# Patient Record
Sex: Female | Born: 1940 | Race: White | Hispanic: No | Smoking: Former smoker
Health system: Southern US, Community
[De-identification: ages and names within clinical notes are randomized; demographics above are authoritative.]

## PROBLEM LIST (undated history)

## (undated) DIAGNOSIS — Z9889 Other specified postprocedural states: Secondary | ICD-10-CM

## (undated) DIAGNOSIS — I509 Heart failure, unspecified: Secondary | ICD-10-CM

## (undated) DIAGNOSIS — N186 End stage renal disease: Secondary | ICD-10-CM

## (undated) DIAGNOSIS — I2510 Atherosclerotic heart disease of native coronary artery without angina pectoris: Secondary | ICD-10-CM

## (undated) DIAGNOSIS — C801 Malignant (primary) neoplasm, unspecified: Secondary | ICD-10-CM

## (undated) DIAGNOSIS — J189 Pneumonia, unspecified organism: Secondary | ICD-10-CM

## (undated) DIAGNOSIS — Z992 Dependence on renal dialysis: Secondary | ICD-10-CM

## (undated) DIAGNOSIS — I10 Essential (primary) hypertension: Secondary | ICD-10-CM

## (undated) DIAGNOSIS — F329 Major depressive disorder, single episode, unspecified: Secondary | ICD-10-CM

## (undated) DIAGNOSIS — D649 Anemia, unspecified: Secondary | ICD-10-CM

## (undated) DIAGNOSIS — E785 Hyperlipidemia, unspecified: Secondary | ICD-10-CM

## (undated) DIAGNOSIS — R112 Nausea with vomiting, unspecified: Secondary | ICD-10-CM

## (undated) DIAGNOSIS — J449 Chronic obstructive pulmonary disease, unspecified: Secondary | ICD-10-CM

## (undated) DIAGNOSIS — E669 Obesity, unspecified: Secondary | ICD-10-CM

## (undated) DIAGNOSIS — R55 Syncope and collapse: Secondary | ICD-10-CM

## (undated) DIAGNOSIS — F32A Depression, unspecified: Secondary | ICD-10-CM

## (undated) DIAGNOSIS — I4891 Unspecified atrial fibrillation: Secondary | ICD-10-CM

## (undated) DIAGNOSIS — I422 Other hypertrophic cardiomyopathy: Secondary | ICD-10-CM

## (undated) HISTORY — PX: BONE MARROW BIOPSY: SHX199

## (undated) HISTORY — PX: ABDOMINAL HYSTERECTOMY: SHX81

## (undated) HISTORY — DX: Atherosclerotic heart disease of native coronary artery without angina pectoris: I25.10

## (undated) HISTORY — DX: Essential (primary) hypertension: I10

## (undated) HISTORY — PX: APPENDECTOMY: SHX54

## (undated) HISTORY — DX: Syncope and collapse: R55

## (undated) HISTORY — PX: COLONOSCOPY: SHX174

## (undated) HISTORY — DX: Other hypertrophic cardiomyopathy: I42.2

## (undated) HISTORY — DX: Hyperlipidemia, unspecified: E78.5

## (undated) HISTORY — DX: Chronic obstructive pulmonary disease, unspecified: J44.9

## (undated) HISTORY — DX: Obesity, unspecified: E66.9

---

## 2006-11-03 ENCOUNTER — Encounter: Payer: Self-pay | Admitting: Internal Medicine

## 2006-11-03 ENCOUNTER — Ambulatory Visit: Payer: Self-pay

## 2006-12-13 ENCOUNTER — Ambulatory Visit: Payer: Self-pay | Admitting: Internal Medicine

## 2006-12-18 ENCOUNTER — Ambulatory Visit: Payer: Self-pay

## 2007-02-28 ENCOUNTER — Ambulatory Visit: Payer: Self-pay | Admitting: Internal Medicine

## 2007-03-22 ENCOUNTER — Ambulatory Visit: Payer: Self-pay

## 2007-03-22 ENCOUNTER — Ambulatory Visit: Payer: Self-pay | Admitting: Internal Medicine

## 2007-03-22 ENCOUNTER — Ambulatory Visit (HOSPITAL_COMMUNITY): Admission: RE | Admit: 2007-03-22 | Discharge: 2007-03-22 | Payer: Self-pay | Admitting: Internal Medicine

## 2007-10-10 ENCOUNTER — Ambulatory Visit: Payer: Self-pay | Admitting: Internal Medicine

## 2007-10-26 ENCOUNTER — Ambulatory Visit: Payer: Self-pay

## 2007-10-26 ENCOUNTER — Encounter: Payer: Self-pay | Admitting: Internal Medicine

## 2008-03-13 ENCOUNTER — Ambulatory Visit: Payer: Self-pay | Admitting: Internal Medicine

## 2008-04-04 ENCOUNTER — Ambulatory Visit: Payer: Self-pay | Admitting: Internal Medicine

## 2008-05-09 HISTORY — PX: CORONARY ANGIOPLASTY: SHX604

## 2008-10-27 ENCOUNTER — Ambulatory Visit: Payer: Self-pay | Admitting: Internal Medicine

## 2008-10-27 DIAGNOSIS — I10 Essential (primary) hypertension: Secondary | ICD-10-CM | POA: Insufficient documentation

## 2008-11-04 ENCOUNTER — Telehealth: Payer: Self-pay | Admitting: Internal Medicine

## 2008-12-12 ENCOUNTER — Encounter: Payer: Self-pay | Admitting: Internal Medicine

## 2008-12-12 ENCOUNTER — Ambulatory Visit: Payer: Self-pay

## 2008-12-19 ENCOUNTER — Telehealth: Payer: Self-pay | Admitting: Internal Medicine

## 2008-12-23 ENCOUNTER — Telehealth: Payer: Self-pay | Admitting: Internal Medicine

## 2009-03-06 ENCOUNTER — Encounter: Payer: Self-pay | Admitting: Internal Medicine

## 2009-03-09 ENCOUNTER — Ambulatory Visit: Payer: Self-pay | Admitting: Internal Medicine

## 2009-03-09 ENCOUNTER — Encounter: Payer: Self-pay | Admitting: Internal Medicine

## 2009-03-09 DIAGNOSIS — R0602 Shortness of breath: Secondary | ICD-10-CM | POA: Insufficient documentation

## 2009-03-11 LAB — CONVERTED CEMR LAB
BUN: 15 mg/dL (ref 6–23)
Basophils Absolute: 0 10*3/uL (ref 0.0–0.1)
Calcium: 8.9 mg/dL (ref 8.4–10.5)
Eosinophils Relative: 2.4 % (ref 0.0–5.0)
GFR calc non Af Amer: 65.66 mL/min (ref >60)
HCT: 38.6 % (ref 36.0–46.0)
Hemoglobin: 13.6 g/dL (ref 12.0–15.0)
INR: 1 (ref 0.8–1.0)
Lymphocytes Relative: 40.1 % (ref 12.0–46.0)
Lymphs Abs: 1.4 10*3/uL (ref 0.7–4.0)
Monocytes Relative: 11.9 % (ref 3.0–12.0)
Platelets: 218 10*3/uL (ref 150.0–400.0)
Potassium: 4.1 meq/L (ref 3.5–5.1)
Prothrombin Time: 10.2 s (ref 9.1–11.7)
Sodium: 141 meq/L (ref 135–145)
WBC: 3.6 10*3/uL — ABNORMAL LOW (ref 4.5–10.5)
aPTT: 26.5 s (ref 21.7–28.8)

## 2009-03-16 ENCOUNTER — Inpatient Hospital Stay (HOSPITAL_BASED_OUTPATIENT_CLINIC_OR_DEPARTMENT_OTHER): Admission: RE | Admit: 2009-03-16 | Discharge: 2009-03-16 | Payer: Self-pay | Admitting: Internal Medicine

## 2009-03-16 ENCOUNTER — Inpatient Hospital Stay (HOSPITAL_COMMUNITY): Admission: AD | Admit: 2009-03-16 | Discharge: 2009-03-17 | Payer: Self-pay | Admitting: Cardiovascular Disease

## 2009-03-16 ENCOUNTER — Ambulatory Visit: Payer: Self-pay | Admitting: Internal Medicine

## 2009-03-18 ENCOUNTER — Encounter: Payer: Self-pay | Admitting: Internal Medicine

## 2009-04-14 ENCOUNTER — Encounter: Payer: Self-pay | Admitting: Internal Medicine

## 2009-04-14 ENCOUNTER — Telehealth: Payer: Self-pay | Admitting: Internal Medicine

## 2009-04-29 ENCOUNTER — Ambulatory Visit: Payer: Self-pay | Admitting: Internal Medicine

## 2009-04-29 DIAGNOSIS — I2510 Atherosclerotic heart disease of native coronary artery without angina pectoris: Secondary | ICD-10-CM

## 2009-05-25 ENCOUNTER — Telehealth: Payer: Self-pay | Admitting: Internal Medicine

## 2009-06-05 ENCOUNTER — Encounter: Payer: Self-pay | Admitting: Internal Medicine

## 2009-08-11 ENCOUNTER — Ambulatory Visit: Payer: Self-pay | Admitting: Internal Medicine

## 2009-08-31 ENCOUNTER — Ambulatory Visit: Payer: Self-pay | Admitting: Internal Medicine

## 2009-10-19 ENCOUNTER — Encounter: Payer: Self-pay | Admitting: Internal Medicine

## 2009-11-20 ENCOUNTER — Ambulatory Visit: Payer: Self-pay | Admitting: Internal Medicine

## 2010-02-15 ENCOUNTER — Ambulatory Visit: Payer: Self-pay | Admitting: Internal Medicine

## 2010-02-16 ENCOUNTER — Telehealth: Payer: Self-pay | Admitting: Internal Medicine

## 2010-02-23 ENCOUNTER — Encounter: Payer: Self-pay | Admitting: Internal Medicine

## 2010-03-12 ENCOUNTER — Telehealth (INDEPENDENT_AMBULATORY_CARE_PROVIDER_SITE_OTHER): Payer: Self-pay | Admitting: *Deleted

## 2010-05-25 ENCOUNTER — Telehealth: Payer: Self-pay | Admitting: Internal Medicine

## 2010-06-09 NOTE — Progress Notes (Signed)
Summary: Problem with handicapped placard  ---- Converted from flag ---- ---- 02/23/2010 8:41 AM, Glynda Jaeger wrote: [name] [name],   This patient called wanting to talk with you, she would like a call from you, says it is important. She is a patient of [facility]. Can be reached at [phone].  Thanks, Melissa ------------------------------ I spoke with patient on 10/18 pm.  Patient said that she took an application for a handicapped placard that we completed to the [facility] in [place], but information was missing.  The [facility] faxed the form here while [name] was waiting.  [name]'s comcern is that we were not able to complete the form while she waited and she had to drive back to the [facility].  I told her that I was sorry that happened, but that non-urgent paperwork usually took more than a few hours to complete.  Again, I apologized.  I followed-up with Dr. Prescott Gum nurse and she assured me that the paperwork was completed the day after it was faxed.

## 2010-06-09 NOTE — Assessment & Plan Note (Signed)
Summary: consult appt. loop recorder/sl  Medications Added METOPROLOL SUCCINATE 50 MG XR24H-TAB (METOPROLOL SUCCINATE) 1 by mouth daily METOPROLOL SUCCINATE 50 MG XR24H-TAB (METOPROLOL SUCCINATE) Take one-half  tablet by mouth daily      Allergies Added:   Visit Type:  Initial Consult Primary Provider:  Violet Baldy MD  CC:  syncope.  History of Present Illness: [name] [name] is a  70 year-old woman he at the request of Dr. Dorthea Cove because of episodes of exercise associated lightheadedness. These episodes are always associated with exertion of various degrees but not necessarily reproducible. They're unassociated with diaphoresis nausea. They're occasionally associated with pallor. There is some residual fatigue. If she continues to push herself thereafter she can trigger another episode. she is aware that the episodes are going to her and describes a prodrome as a squeezing in her head". She dates the onset of these back to the death of her husband 3 or 4 years ago although her friend who accompanied her said that this may be partly related to the fact that with him she is much more active  She originally came to medical attention because of the identification of hypertrophic obstructive cardiomyopathy in  her son. He did need to undergo myectomy. she herself underwent a  Echocardiogram demonstrating EF of 70% with septum being 1.7 cm.  There was mild chordal SAM, but no significant outflow tract gradient.  She also underwent stress echo which failed to demonstrate enhancement of the gradient with exercise.  She then underwen  a CPX test which showed normal blood pressure response to exercise with relatively preserved VO2. she also underwent catheterization in November 2010 underwent cath which showed high-grade LAD disease. RHC was normal. She underwent Endeavour DES to LAD and balloon of the diagonal in 11/10. unfortunately this was not associated with any improvement in her symptoms.  E.  cardiogram from her last visit with Dr. Bryson Ha was reviewed and demonstrated normal sinus rhythm with normal intervals     Current Medications (verified): 1)  Losartan Potassium-Hctz 100-12.5 Mg Tabs (Losartan Potassium-Hctz) .... Once Daily 2)  Alendronate Sodium 70 Mg Tabs (Alendronate Sodium) .... Weekly 3)  Simvastatin 40 Mg Tabs (Simvastatin) .... Take One Tablet By Mouth Daily At Bedtime 4)  Metoprolol Succinate 50 Mg Xr24h-Tab (Metoprolol Succinate) .Marland Kitchen.. 1 By Mouth Daily 5)  Aspirin 81 Mg Tbec (Aspirin) .... Take One Tablet By Mouth Daily 6)  Plavix 75 Mg Tabs (Clopidogrel Bisulfate) .... Take One Tablet By Mouth Daily  Allergies (verified): 1)  ! Pcn 2)  ! Morphine  Vital Signs:  Patient profile:   70 year old female Height:      63 inches Weight:      173 pounds BMI:     30.76 Pulse (ortho):   67 / minute Resp:     18 per minute BP standing:   142 / 79  Vitals Entered By: Marrion Coy, CNA (August 31, 2009 1:52 PM)  Serial Vital Signs/Assessments:  Time      Position  BP       Pulse  Resp  Temp     By           Lying RA  134/69   59                    Christy Tuck, CNA           Sitting   145/77   62  Marrion Coy, CNA           Standing  142/79   [address] CNA 2 MIn     Standing  138/74   [address] CNA 5 Min     Standing  132/75   68                    [employer], CNA  Comments: Lying- No complaints Sitting- No complaints Standing- No complaints  By: Marrion Coy, CNA  2 MIn 2 Min Standing- No complaints By: Marrion Coy, CNA  5 Min 5 Min standing- No complaints By: Marrion Coy, CNA    Physical Exam  General:  Well developed, well nourished, in no acute distress. Head:  normal HEENT Neck:  supple without thyromegaly Chest Wall:  mild kyphosis; no CVA tenderness Lungs:  clear to auscultation Heart:  regular rate and rhythm with a 2/6 murmur heard best at the right upper  sternal border enhanced by Valsalva Abdomen:  soft and nontender without hepatomegaly. No midline pulsation was appreciated Msk:  Back normal, normal gait. Muscle strength and tone normal. Pulses:  intact distal pulses Extremities:  no clubbing cyanosis or edema Neurologic:  Alert and oriented x 3. Skin:  the skin was warm and dry Cervical Nodes:  she is some submandibularlymph nodes but none others Psych:  engaging affect   Impression & Recommendations:  Problem # 1:  PRE-SYNCOPE (ICD-780.4) [name] [name] has recurrent episodes of presyncope associated with exertion. It is possible that these are arrhythmic; it is also possible that they represent vascular response to exertion. I agree that monitoring will be critical to try to identify whether there is any rhythmic trigger. Given the frequency of these episodes and the reproducible nature of them I think an external monitor may be a way to do it at first. We have discussed the role of a single 19 hours monitor and we will begin with that. In the event that this fails we'll pursue an implantable loop recorder  Problem # 2:  HCM (ICD-425.1) she is hypertrophic nonobstructive cardiomyopathy.Her murmur enhances little bit with Valsalva; however, her echo was unimpressive for stress-induced obstruction.  Problem # 3:  DYSPNEA (ICD-786.05) Please see above discussion Her updated medication list for this problem includes:    Losartan Potassium-hctz 100-12.5 Mg Tabs (Losartan potassium-hctz) ..... Once daily    Metoprolol Succinate 50 Mg Xr24h-tab (Metoprolol succinate) .Marland Kitchen... Take one-half  tablet by mouth daily    Aspirin 81 Mg Tbec (Aspirin) .Marland Kitchen... Take one tablet by mouth daily  Other Orders: Event (Event)  Patient Instructions: 1)  Your physician has recommended that you wear an event monitor.  Event monitors are medical devices that record the heart's electrical activity. Doctors most often use these monitors to diagnose arrhythmias.  Arrhythmias are problems with the speed or rhythm of the heartbeat. The monitor is a small, portable device. You can wear one while you do your normal daily activities. This is usually used to diagnose what is causing palpitations/syncope (passing out). 2)  Your physician recommends that you schedule a follow-up appointment in: 4-6 weeks with Dr Graciela Husbands.

## 2010-06-09 NOTE — Progress Notes (Signed)
Summary: pt needs handicap sticker  Phone Note Call from Patient Call back at [phone]   Caller: Patient Reason for Call: Talk to Nurse, Talk to Doctor Summary of Call: pt forgot to ask for a handicap sticker application for when she goes out becasue she has fainting spells when she walks far Initial call taken by: Omer Jack,  February 16, 2010 9:02 AM  Follow-up for Phone Call        Dr Gala Romney signed form, mailed it to pt, she is aware Meredith Staggers, RN  February 16, 2010 12:40 PM

## 2010-06-09 NOTE — Progress Notes (Signed)
Summary: plavix/foot cut   Phone Note Call from Patient Call back at [phone]   Summary of Call: pt on plavix, cut her foot, has bandage on.... afraid if she takes it off it will start bleeding again.  Should she take her meds this am? Should she do to cardio rehab, request call back asap Initial call taken by: Migdalia Dk,  May 25, 2009 8:19 AM  Follow-up for Phone Call        Pt cut her foot just above her ankle and she is on Plavix. They applied a dressing yesterday and wanted to know whether to take it off today. I advised her to leave it on today and take it off tomorrow. She wanted to know whether to take her Plavix and ASA today. I told her she could hold off today and resume it tomorrow as well. She wanted to know whether to go on to Cardiac Rehab. I told her that would be fine since there are nurses there and would be able to bandage it better and maybe apply a pressure dressing for her. She understands and will call with any other issues.  Follow-up by: Duncan Dull, RN, BSN,  May 25, 2009 8:46 AM     Appended Document: plavix/foot cut she has recent DES to LAD, please call her and make sure she resumes plavix asap. if cut on foot is bad needs to go see PCP

## 2010-06-09 NOTE — Assessment & Plan Note (Signed)
Summary: f56m  Medications Added LOSARTAN POTASSIUM-HCTZ 100-12.5 MG TABS (LOSARTAN POTASSIUM-HCTZ) once daily ASPIRIN 81 MG TBEC (ASPIRIN) Take one tablet by mouth daily      Allergies Added:   Visit Type:  Follow-up Primary Provider:  Violet Baldy MD  CC:  sob -- with exertion.  History of Present Illness: [name] [name] is a very pleasant 70 year-old woman with a history of hypertension, COPD, hyperlipidemia, and mild hypertrophic cardiomyopathy without significant obstruction. Echocardiogram has an EF of 70% with septum being 1.7 cm.  There was mild chordal SAM, but no significant outflow tract gradient.  She had a CPX test which showed normal blood pressure response to exercise with relatively preserved VO2.  There is a question of mild exercise-induced bronchospasm, but she has not responded well to inhalers in the past.   In November 2010 underwent cath which showed high-grade LAD disease. RHC was normal. She underwent Endeavour DES to LAD and balloon of the diagonal in 11/10.   Returns for routine f/u.  Still in cardiac rehab maintenance program. Still having occasional spells where she feels presyncopal.  Occurs approximately 2x/month. No frank syncope. Had previous monitor which was unremarkable. Pain in hips with walking. No CP. Chronic exertion dyspnea.  No bleeding with Plavix.   Current Medications (verified): 1)  Losartan Potassium-Hctz 100-12.5 Mg Tabs (Losartan Potassium-Hctz) .... Once Daily 2)  Alendronate Sodium 70 Mg Tabs (Alendronate Sodium) .... Weekly 3)  Simvastatin 40 Mg Tabs (Simvastatin) .... Take One Tablet By Mouth Daily At Bedtime 4)  Metoprolol Succinate 25 Mg Xr24h-Tab (Metoprolol Succinate) .... Take One Tablet By Mouth Daily 5)  Aspirin 81 Mg Tbec (Aspirin) .... Take One Tablet By Mouth Daily 6)  Plavix 75 Mg Tabs (Clopidogrel Bisulfate) .... Take One Tablet By Mouth Daily  Allergies (verified): 1)  ! Pcn 2)  ! Morphine  Past History:  Past Medical  History: HTN Hyperlipidemia Mild hypertrophic CM without outflow tract obstruction    -- ETT/Myoview EF 74%. no ischemia (12/2006)    --stress echo 8/10: normal BP response to exercise. no presyncope              no wall motion abnormalties. No significant LVOT obstruction.                non-diagnositc ST depression    --holter monitor normal CAD   --s/p DES LAD and POBA diagonal 11/10   --RHC cath normal COPD Obesity Pre-syncope  Review of Systems       As per HPI and past medical history; otherwise all systems negative.   Vital Signs:  Patient profile:   70 year old [name] Height:      63 inches Weight:      172 pounds BMI:     30.[name] Pulse rate:   59 / minute BP sitting:   132 / 68  (left arm) Cuff size:   regular  Vitals Entered By: Hardin Negus, RMA (August 11, 2009 11:24 AM)  Physical Exam  General:  Gen: well appearing. no resp difficulty HEENT: normal Neck: supple. no JVD. Carotids 2+ bilat; no bruits. No lymphadenopathy or thryomegaly appreciated. Cor: PMI nondisplaced. Regular rate & rhythm. No rubs, gallops, no LVOT murmur at rest or with valsalva Lungs: clear Abdomen: soft, nontender, nondistended. No hepatosplenomegaly. No bruits or masses. Good bowel sounds. Extremities: no cyanosis, clubbing, rash, edema. r groin site fine. no hematome or bruit Neuro: alert & orientedx3, cranial nerves grossly intact. moves all 4 extremities w/o difficulty.  affect pleasant    Impression & Recommendations:  Problem # 1:  CAD, NATIVE VESSEL (ICD-414.01) Stable. No evidence of ischemia. Continue current regimen.  Problem # 2:  SYNCOPE (ICD-780.2) Remains unexplained despite exhaustive work-up. We have done a monitor with no evidence of arrythmia and stress echo no provokable LVOT gradient yet episodes continue. Will refer to EP for possible implantable loop recorder to help evaluate.  Other Orders: EKG w/ Interpretation (93000) EP Referral (Cardiology EP Ref  )  Patient Instructions: 1)  You have been referred to EP MDs for next week to discuss an implantable loop recorder 2)  Follow up with Dr Gala Romney in 6 months Prescriptions: METOPROLOL SUCCINATE 25 MG XR24H-TAB (METOPROLOL SUCCINATE) Take one tablet by mouth daily  #90 x 3   Entered by:   Meredith Staggers, RN   Authorized by:   Dolores Patty, MD, [facility]   Signed by:   Meredith Staggers, RN on 08/11/2009   Method used:   Electronically to        [facility]* (retail)       [address]       [place], [place]  [address]       Ph: [phone]       Fax: [phone]   RxID:   [id number]

## 2010-06-09 NOTE — Miscellaneous (Signed)
Summary: [facility] Cardiac Progress Note  [facility] Cardiac Progress Note   Imported By: Roderic Ovens 10/13/2009 12:22:42  _____________________________________________________________________  External Attachment:    Type:   Image     Comment:   External Document

## 2010-06-09 NOTE — Assessment & Plan Note (Signed)
Summary: 6week return.amber  Medications Added LOSARTAN POTASSIUM 100 MG TABS (LOSARTAN POTASSIUM) Take 1 tablet by mouth once a day        Visit Type:  6 wk f/u Primary Provider:  Violet Baldy MD  CC:  sob...denies any cp or edema.  History of Present Illness: [name] [name] is he had followup for episodes of lightheadedness associated with staining and also exertion. As we hear the story today there is a great deal more emphasis in the telemetry noted any change of symptoms with standing. She does not have symptoms lying or sitting.  She does have modest hypertrophic cardiomyopathy And hypertension  She underwent event loop recording and had a number of episodes while wearing the monitor. These were all associated with sinus rhythm.          Allergies: 1)  ! Pcn 2)  ! Morphine  Past History:  Past Medical History: Last updated: 08/11/2009 HTN Hyperlipidemia Mild hypertrophic CM without outflow tract obstruction    -- ETT/Myoview EF 74%. no ischemia (12/2006)    --stress echo 8/10: normal BP response to exercise. no presyncope              no wall motion abnormalties. No significant LVOT obstruction.                non-diagnositc ST depression    --holter monitor normal CAD   --s/p DES LAD and POBA diagonal 11/10   --RHC cath normal COPD Obesity Pre-syncope  Vital Signs:  Patient profile:   70 year old female Height:      63 inches Weight:      173 pounds BMI:     30.76 Pulse rate:   71 / minute Pulse rhythm:   regular BP sitting:   134 / 66  (left arm) Cuff size:   regular  Vitals Entered By: Danielle Rankin, CMA (November 20, 2009 2:18 PM)  Physical Exam  General:  The patient was alert and oriented in no acute distress. HEENT Normal.  Neck veins were flat, carotids were brisk.  Lungs were clear.  Heart sounds were regular without murmurs or gallops.  Abdomen was soft with active bowel sounds. There is no clubbing cyanosis or edema. Skin Warm and  dry    Impression & Recommendations:  Problem # 1:  PRE-SYNCOPE (ICD-780.4) I suspect that this is largely a manifestation of orthostatic hypotension. With her hypertrophic heart disease there may also be some vasomotor instability associated with exertion. I have instructed her in some exercises that she can do to help mitigate changes in blood pressure with standing. We have also discontinued the diuretic portion of her antihypertensive therapy. Negative inotropic medications given her hypertrophic cardiomyopathy might also be helpful. I will defer this to her follow up with Dr. Dorthea Cove  Problem # 2:  HYPERTENSION, BENIGN (ICD-401.1) as above Her updated medication list for this problem includes:    Losartan Potassium 100 Mg Tabs (Losartan potassium) .Marland Kitchen... Take 1 tablet by mouth once a day    Metoprolol Succinate 50 Mg Xr24h-tab (Metoprolol succinate) .Marland Kitchen... Take one-half  tablet by mouth daily    Aspirin 81 Mg Tbec (Aspirin) .Marland Kitchen... Take one tablet by mouth daily  Problem # 3:  HCM (ICD-425.1) as above Her updated medication list for this problem includes:    Losartan Potassium 100 Mg Tabs (Losartan potassium) .Marland Kitchen... Take 1 tablet by mouth once a day    Metoprolol Succinate 50 Mg Xr24h-tab (Metoprolol succinate) .Marland Kitchen... Take one-half  tablet  by mouth daily    Aspirin 81 Mg Tbec (Aspirin) .Marland Kitchen... Take one tablet by mouth daily    Plavix 75 Mg Tabs (Clopidogrel bisulfate) .Marland Kitchen... Take one tablet by mouth daily  Problem # 4:  CAD, NATIVE VESSEL (ICD-414.01) no chest pains stable her current medications Her updated medication list for this problem includes:    Metoprolol Succinate 50 Mg Xr24h-tab (Metoprolol succinate) .Marland Kitchen... Take one-half  tablet by mouth daily    Aspirin 81 Mg Tbec (Aspirin) .Marland Kitchen... Take one tablet by mouth daily    Plavix 75 Mg Tabs (Clopidogrel bisulfate) .Marland Kitchen... Take one tablet by mouth daily  Patient Instructions: 1)  Your physician has recommended you make the following change in  your medication: Change Losartan/HCT to Losartan 100mg  1 tablet daily. 2)  Your physician wants you to follow-up in: 3 months with Dr Gala Romney.  You will receive a reminder letter in the mail two months in advance. If you don't receive a letter, please call our office to schedule the follow-up appointment. Prescriptions: LOSARTAN POTASSIUM 100 MG TABS (LOSARTAN POTASSIUM) Take 1 tablet by mouth once a day  #30 x 11   Entered by:   Optometrist BSN   Authorized by:   Nathen May, MD, [facility]   Signed by:   Gypsy Balsam RN BSN on 11/20/2009   Method used:   Electronically to        [id number]* (retail)       [address]       [place], [place]  [address]       Ph: [phone]       Fax: [phone]   RxID:   [id number]

## 2010-06-09 NOTE — Letter (Signed)
Summary: [facility] Office Visit Note  [facility] Office Visit Note   Imported By: Roderic Ovens 10/29/2009 10:44:39  _____________________________________________________________________  External Attachment:    Type:   Image     Comment:   External Document

## 2010-06-09 NOTE — Assessment & Plan Note (Signed)
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 Echocardiogram has an EF of 70% with septum being 1.7 cm.  There was mild chordal SAM, but no significant outflow tract gradient.  She had a CPX test which showed normal blood pressure response to exercise with relatively preserved VO2.  There is a question of mild exercise-induced bronchospasm, but she has not responded well to inhalers in the past.  Also CAD s/p DES  to LAD 11/10.  She returns today for routine followup. Continues to have spells that when she is walking gets SOB and then feels presyncopal.  Can happen 2x/day or not bother her for a week. Very intermittent. Can go to senior dance without problems but then can have an episode while just walking slowly. Sits down or leans against the wall and it gets better. No frank  loss of consciousness. No CP or palpitations. Does have mild dyspnea when she walks quickly. No edema, PND or orthopnea.   In past she had stress echo with normal BP response and no significant LVOT gradient. Holter normal. I referred her to Dr. Graciela Husbands for consideration of ILR. Given the frequency of her events he decided to repeat Holter. She had several episodes of symptoms while wearing monitor but all corresponded to SR. Felt like symptoms might be related to autonomic dysfunction.   Current Medications (verified): 1)  Losartan Potassium 100 Mg Tabs (Losartan Potassium) .... Take 1 Tablet By Mouth  Once A Day 2)  Simvastatin 5 Mg Tabs (Simvastatin) .... Take One Tablet By Mouth Daily At Bedtime 3)  Metoprolol Succinate 50 Mg Xr24h-Tab (Metoprolol Succinate) .... Take One-Half  Tablet By Mouth Daily 4)  Aspirin 81 Mg Tbec (Aspirin) .... Take One Tablet By Mouth Daily 5)  Plavix 75 Mg Tabs (Clopidogrel Bisulfate) .... Take One Tablet By Mouth Daily 6)  Centrum Silver  Tabs (Multiple Vitamins-Minerals) .... Take 1 Tablet By Mouth Once A Day  Allergies: 1)  ! Pcn 2)  ! Morphine  Past History:  Past Medical History: Last updated: 08/11/2009 HTN Hyperlipidemia Mild hypertrophic CM without outflow tract obstruction    -- ETT/Myoview EF 74%. no ischemia (12/2006)    --stress echo 8/10: normal BP response to exercise. no presyncope              no wall motion abnormalties. No significant LVOT obstruction.                non-diagnositc ST depression    --holter monitor normal CAD   --s/p DES LAD and POBA diagonal 11/10   --RHC cath normal COPD Obesity Pre-syncope  Review of Systems       As per HPI and past medical history; otherwise all systems negative.   Vital Signs:  Patient profile:   70 year old female Height:      63 inches Weight:      173 pounds BMI:     30.76 Pulse rate:   67 / minute Pulse rhythm:   regular  Resp:     18 per minute BP sitting:   114 / 70  (left arm) Cuff size:   large  Vitals Entered By: Vikki Ports (February 15, 2010 4:40 PM)  Physical Exam  General:  The patient was alert and oriented in no acute distress. HEENT Normal.  Neck veins were flat, carotids were brisk.  Lungs were clear.  Heart sounds were regular without murmurs or gallops.  Abdomen was soft with active bowel sounds. There is no clubbing cyanosis or edema. Skin Warm and dry    Impression & Recommendations:  Problem # 1:  PRE-SYNCOPE (ICD-780.4) Based on her exhausitve work-up to date I don't think her symptoms are cardiac. She has had several monitors with no  arrhythmia and her BP response has been normal with exercise and no evidence of orthostasis. At this point, I have reassured her and her daughter-in-law that while these events are disturbing they are likely not dangerous. We will continue current therapy.   Problem # 2:  CAD, NATIVE VESSEL (ICD-414.01) Long talk about the risks of stopping Plavix one year s/p DES to LAD. Quoted her about a 1% risk of stent thrombosis and possible fatal MI. She will continue throughout the end of the year and then would like to stop.   Patient Instructions: 1)  Your physician recommends that you schedule a follow-up appointment in: 9 -12 months 2)  Your physician recommends that you continue on your current medications as directed. Please refer to the Current Medication list given to you today. Prescriptions: METOPROLOL SUCCINATE 25 MG XR24H-TAB (METOPROLOL SUCCINATE) Take one tablet by mouth daily  #90 x 3   Entered by:   Dossie Arbour, RN, BSN   Authorized by:   Dolores Patty, MD, [facility]   Signed by:   Dossie Arbour, RN, BSN on 02/15/2010   Method used:   Electronically to        [facility] # [phone]* (retail)       [address]       [place], [place]  [address]       Ph: [phone]       Fax: [phone]   RxID:   [id number] PLAVIX 75 MG TABS (CLOPIDOGREL BISULFATE) Take one tablet by mouth daily  #30 x 1   Entered by:   Dossie Arbour, RN, BSN   Authorized by:   Dolores Patty, MD, [facility]   Signed by:   Dossie Arbour, RN, BSN on 02/15/2010   Method used:   Electronically to        [facility] [address]* (retail)       [address]       [place], [place]  [address]       Ph: [phone]       Fax: [phone]   RxID:   [id number]

## 2010-06-10 NOTE — Letter (Signed)
Summary: [employer] - Disabled Psychologist, occupational - [employer]   Imported By: Marylou Mccoy 04/29/2010 16:34:11  _____________________________________________________________________  External Attachment:    Type:   Image     Comment:   External Document

## 2010-06-10 NOTE — Progress Notes (Signed)
Summary: pt needs to take generic  Phone Note Call from Patient Call back at Home Phone [phone] Call back at [phone]   Caller: Patient Reason for Call: Talk to Nurse, Talk to Doctor Summary of Call: pt is on metoprolol 25mg  and the price has gone up and they want her to take a generic and the one's the insurance suggest are tratrate or atenolol or carvedilol. pt will be at home til 4pm please try to call prior to that. Initial call taken by: Omer Jack,  May 25, 2010 12:19 PM  Follow-up for Phone Call        pt out of med and tried to refill it today but the price has changed, sent in rx for tartrate 25mg  1/2 two times a day Meredith Staggers, RN  May 25, 2010 2:43 PM     New/Updated Medications: METOPROLOL TARTRATE 25 MG TABS (METOPROLOL TARTRATE) Take 1/2 tablet by mouth twice a day Prescriptions: METOPROLOL TARTRATE 25 MG TABS (METOPROLOL TARTRATE) Take 1/2 tablet by mouth twice a day  #90 x 3   Entered by:   Meredith Staggers, RN   Authorized by:   Dolores Patty, MD, [facility]   Signed by:   Meredith Staggers, RN on 05/25/2010   Method used:   Electronically to        [facility] # [phone]* (retail)       [address]       [place], [place]  [address]       Ph: [phone]       Fax: [phone]   RxID:   [id number]

## 2010-08-11 LAB — POCT I-STAT 3, VENOUS BLOOD GAS (G3P V)
Bicarbonate: 26.8 mEq/L — ABNORMAL HIGH (ref 20.0–24.0)
O2 Saturation: 71 %
O2 Saturation: 76 %
TCO2: 26 mmol/L (ref 0–100)
TCO2: 28 mmol/L (ref 0–100)
pCO2, Ven: 42.5 mmHg — ABNORMAL LOW (ref 45.0–50.0)
pH, Ven: 7.383 — ABNORMAL HIGH (ref 7.250–7.300)
pO2, Ven: 37 mmHg (ref 30.0–45.0)
pO2, Ven: 42 mmHg (ref 30.0–45.0)

## 2010-08-11 LAB — BASIC METABOLIC PANEL
Calcium: 8.7 mg/dL (ref 8.4–10.5)
GFR calc Af Amer: >60 mL/min (ref >60)
GFR calc non Af Amer: 50 mL/min — ABNORMAL LOW (ref >60)
Potassium: 4.3 mEq/L (ref 3.5–5.1)
Sodium: 139 mEq/L (ref 135–145)

## 2010-08-11 LAB — POCT I-STAT 3, ART BLOOD GAS (G3+)
Bicarbonate: 24.6 mEq/L — ABNORMAL HIGH (ref 20.0–24.0)
pH, Arterial: 7.411 — ABNORMAL HIGH (ref 7.350–7.400)
pO2, Arterial: 88 mmHg (ref 80.0–100.0)

## 2010-08-11 LAB — CBC
MCHC: 34.9 g/dL (ref 30.0–36.0)
MCV: 95.5 fL (ref 78.0–100.0)
Platelets: 167 10*3/uL (ref 150–400)
RDW: 13.4 % (ref 11.5–15.5)

## 2010-09-01 ENCOUNTER — Encounter: Payer: Self-pay | Admitting: Internal Medicine

## 2010-09-08 ENCOUNTER — Encounter: Payer: Self-pay | Admitting: Internal Medicine

## 2010-09-13 ENCOUNTER — Encounter: Payer: Self-pay | Admitting: Internal Medicine

## 2010-09-13 ENCOUNTER — Ambulatory Visit (INDEPENDENT_AMBULATORY_CARE_PROVIDER_SITE_OTHER): Payer: PRIVATE HEALTH INSURANCE | Admitting: Internal Medicine

## 2010-09-13 VITALS — BP 140/64 | HR 64 | Resp 18 | Ht 61.0 in | Wt 175.0 lb

## 2010-09-13 DIAGNOSIS — I10 Essential (primary) hypertension: Secondary | ICD-10-CM

## 2010-09-13 DIAGNOSIS — R0602 Shortness of breath: Secondary | ICD-10-CM

## 2010-09-13 DIAGNOSIS — I2510 Atherosclerotic heart disease of native coronary artery without angina pectoris: Secondary | ICD-10-CM

## 2010-09-13 NOTE — Progress Notes (Signed)
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 We have followed her for dyspnea and presyncope.  Echocardiogram has an EF of 70% with septum being 1.7 cm.  There was mild chordal SAM, but no significant outflow tract gradient.  She had a CPX test which showed normal blood pressure response to exercise with relatively preserved VO2.  There is a question of mild exercise-induced bronchospasm, but she has not responded well to inhalers in the past.  In past she had stress echo with normal BP response and no significant LVOT gradient. Holter normal. I referred her to Dr. Graciela Husbands for consideration of ILR. Given the frequency of her events he decided to repeat Holter. She had several episodes of symptoms while wearing monitor but all corresponded to SR. Felt like symptoms might be related to autonomic dysfunction.  She returns today for routine followup. Slipped and fell in December and broke L ankle. Continues to have spells that when she is walking gets SOB and then feels presyncopal. Very intermittent. No frank  loss of consciousness. No CP or palpitations. Does have mild dyspnea when she walks quickly.      ROS: All systems negative except as listed in HPI, PMH and Problem List.  Past Medical History  Diagnosis Date  . HTN (hypertension)   . Hyperlipidemia   . Hypertrophic cardiomyopathy     Mild hypertrophic CM without outflow tract obstruction -- ETT/Myoview EF 74%. no ischemia (12/2006)  --stress echo 8/10: normal BP response to exercise. no presyncope  no wall motion abnormalties. No significant LVOT obstruction. non-diagnositc ST depression  --holter monitor normal   . CAD (coronary artery disease)     --s/p DES LAD and POBA diagonal 11/10  --RHC cath normal  . COPD (chronic obstructive pulmonary disease)   . Obesity   . Pre-syncope      Current Outpatient Prescriptions  Medication Sig Dispense Refill  . aspirin 81 MG EC tablet Take 81 mg by mouth daily.        Marland Kitchen losartan (COZAAR) 100 MG tablet Take 100 mg by mouth daily.        . metoprolol tartrate (LOPRESSOR) 25 MG tablet Take 1/2 a tab by mouth twice a day.       . Multiple Vitamins-Minerals (CENTRUM SILVER PO) Take 1 tablet by mouth daily.        . simvastatin (ZOCOR) 80 MG tablet Take 80 mg by mouth at bedtime.        Marland Kitchen DISCONTD: clopidogrel (PLAVIX) 75 MG tablet Take 75 mg by mouth daily.        Marland Kitchen DISCONTD: simvastatin (ZOCOR) 5 MG tablet Take 5 mg by mouth at bedtime.           PHYSICAL EXAM: Filed Vitals:   09/13/10 1407  BP: 140/64  Resp: 18   General:  Gen: well appearing. no resp difficulty HEENT: normal Neck: supple. no JVD. Carotids 2+ bilat; no bruits. No lymphadenopathy or thryomegaly appreciated. Cor: PMI nondisplaced. Regular rate & rhythm. Soft LVOT murmur Lungs: clear Abdomen: obese. soft, nontender, nondistended. No hepatosplenomegaly. No bruits or masses. Good bowel sounds. Extremities: no cyanosis, clubbing, rash, edema. r groin site fine. no hematome or bruit Neuro: alert & orientedx3, cranial nerves grossly intact. moves all 4 extremities w/o difficulty. affect pleasant   ECG: NSR 64 No ST-T wave abnormalities.     ASSESSMENT &  PLAN:

## 2010-09-13 NOTE — Assessment & Plan Note (Signed)
BP only mildly elevated. Continue current regimen. Follow up with PCP.

## 2010-09-13 NOTE — Assessment & Plan Note (Signed)
No evidence of ischemia. Continue current regimen.   

## 2010-09-13 NOTE — Assessment & Plan Note (Signed)
I suspect this is mostly due to obesity and deconditioning. Have suggested weight loss and exercise program. We discussed Weight Watchers and [facility] program [facility]

## 2010-09-13 NOTE — Patient Instructions (Signed)
Your physician wants you to follow-up in:  6 months. You will receive a reminder letter in the mail two months in advance. If you don't receive a letter, please call our office to schedule the follow-up appointment.   

## 2010-09-21 NOTE — Assessment & Plan Note (Signed)
[facility]                            CARDIOLOGY OFFICE NOTE   NAME:[name], [name]                       MRN:          [id number]  DATE:12/13/2006                            DOB:          Nov 23, 1940    PRIMARY CARE PHYSICIAN:  Dr. Carlota [name].   REASON FOR VISIT:  Abnormal echocardiogram and family history of  hypertrophic cardiomyopathy.   HISTORY OF PRESENT ILLNESS:  [name] [name] is a very pleasant 70 year old  woman with no history of known cardiac disease.  Her son, [name] [name], is a patient of mine with severe hypertrophic cardiomyopathy  and he is status post septal myomectomy by Dr. Gasper Lloyd.  She had  underwent routine screening and found to have an abnormal echocardiogram  and she presents here.   She says she has had a stress test in the past, but does not remember  the results.  She has never had a cardiac cath.  She denies chest pain  or shortness of breath.  However, over the past year or so she says when  she is walking and really pushing herself, or carrying something, she  feels that her legs get weak and she feels dizzy and presyncopal with  pressure in her head.  She stops and the symptoms get better.  The  symptoms are worse in the heat.  She has not had frank syncope.  She  denies palpitations.  As part of routine screening, she did have an  echocardiogram which showed some very mild proximal septal thickening,  and mild systolic anterior motion of the mitral valve, but no  significant gradient across the LV outflow track.   REVIEW OF SYSTEMS:  As per HPI and problem list, otherwise all systems  negative.   PROBLEM LIST:  1. Hypertension.  2. Chronic obstructive pulmonary disease with previous tobacco use,      quit 5 years ago.  3. Family history of HOCM.  4. Hyperlipidemia.   CURRENT MEDICATIONS:  1. Diovan/ hydrochlorothiazide 80/12.5.  2. Lexapro 5 mg a day.  3. Alendronate.  4. Simvastatin 20 a  day.   ALLERGIES:  PENICILLIN.   FAMILY HISTORY:  Notable for hypertrophic cardiomyopathy in her son.  Mother died at 67 due to heart attack, father died at 61 due to myeloma.   SOCIAL HISTORY:  She is widowed.  She retired from a Veterinary surgeon.  She  had a history of tobacco, quit 5 years ago, no alcohol.   PHYSICAL EXAMINATION:  She is well appearing in no acute distress,  ambulates around the clinic without any respiratory difficulty.  Blood pressure initially 129/79, on recheck is 154/78, heart rate is 72,  weight is 169.  HEENT:  Normal.  NECK:  Supple, no JVD.  Carotids are 2+ bilaterally without bruits.  There is no lymphadenopathy or thyromegaly.  CARDIAC:  PMI is nondisplaced, regular rate and rhythm.  No murmurs,  rubs, or gallops.  There is no provokable murmur with standing from a  squatting position or Valsalva.  LUNGS:  Clear with mildly decreased breath sounds throughout.  ABDOMEN:  Obese, nontender, nondistended.  No hepatosplenomegaly, no  bruits, no masses.  Good bowel sounds.  EXTREMITIES:  Warm with no cyanosis, clubbing, or edema.  NEURO:  Alert and oriented x3.  Cranial nerves II-XII are intact.  Moves  all 4 extremities without difficulty.  Affect is pleasant.   EKG shows sinus rhythm with occasional PACs.  Rate of 72, no ST-T wave  abnormalities.  I have reviewed her echocardiogram.  She has a  hyperdynamic LV with and EF of about 70%, mild proximal septal  thickening with mild systolic anterior motion of the mitral valve.  There is no provokable gradient.  Septal wall thickness is 1.7 mm.   ASSESSMENT AND PLAN:  1. Abnormal echocardiogram:  She seems to have, perhaps, some genetic      predisposition to hypertrophic cardiomyopathy, but she does not      have any significant LVOT gradient.  I do not think she will have a      problem with this in the future.  2. Weak spells:  I think these are probably due to overexerting      herself and being  deconditioned.  However, given her risk factors,      I do think it is reasonable to proceed with a treadmill Myoview to      quantitate her functional capacity and rule out underlying      ischemia.  If this is negative, we will recommend a conditioning      program for her.  3. Hypertension:  Blood pressure is elevated today.  She says that      baseline it is usually well controlled.  She will continue to      follow with her primary care doctor.  4. Hyperlipidemia:  Followed by her primary care doctor.   DISPOSITION:  Based on the results of her testing.     [name], MD  Electronically Signed    DRB/MedQ  DD: 12/13/2006  DT: 12/14/2006  Job #: [id number]   cc:   [name] [name]

## 2010-09-21 NOTE — Assessment & Plan Note (Signed)
[name] [name]                            CARDIOLOGY OFFICE NOTE   NAME:[name] [name], [name] [name]                       MRN:          [id number]  DATE:04/04/2008                            DOB:          [birth year]    PRIMARY CARE PHYSICIAN:  [name] Raspberry, MD, in [place], [place].   INTERVAL HISTORY:  [name] [name] is a very pleasant 70 year old woman  with a history of hypertension, COPD, hyperlipidemia, and mild  hypertrophic cardiomyopathy without significant obstruction.  Echocardiogram has an EF of 70% with septum being 1.7 cm.  There was  mild chordal SAM, but no significant outflow tract gradient.  She had a  CPX test which showed normal blood pressure response to exercise with  relatively preserved VO2.  There is a question of mild exercise-induced  bronchospasm, but she has not responded well to inhalers in the past.   She returns today for routine followup.  She says overall she feels  well.  She denies any significant chest pain maybe a little bit of  occasional dyspnea.  Her main complaint is when she really pushes  herself, she feels presyncopal, but she has not had frank syncopal  episode.  This only occurs with exertion and not at rest.   CURRENT MEDICATIONS:  1. Diovan hydrochlorothiazide 80/12.5.  2. Alendronate.  3. Simvastatin 40 a day.  4. Metoprolol 25 a day.   ALLERGIES/INTOLERANCES:  ACE INHIBITORS cause a severe cough.   PHYSICAL EXAMINATION:  GENERAL:  She is well-appearing in no acute  distress, ambulates around the clinic without respiratory difficulty.  VITAL SIGNS:  Blood pressure is 124/60, heart rate is 64, and weight is  172.  HEENT:  Normal.  NECK:  Supple.  No JVD.  Carotids are 2+ bilaterally.  There is faint  bruit on the right.  CARDIAC:  PMI is nondisplaced.  She has regular rhythm with an S4.  There is a soft systolic ejection murmur at the right sternal border.  LUNGS:  Clear with mildly decreased air with  throughout.  ABDOMEN:  Obese, nontender, nondistended, no hepatosplenomegaly, no  bruits, no masses.  Good bowel sounds.  EXTREMITIES:  Warm with no  cyanosis, clubbing, or edema.  NEURO:  Alert and oriented x3.  Cranial nerves II through XII are  intact.  Moves all 4 extremities without difficulty.  Affect is  pleasant.   ASSESSMENT AND PLAN:  1. Probable hypertrophic cardiomyopathy.  This is asymptomatic.  She      had a normal blood pressure response to exercise.  She is      maintained on low-dose beta-blocker.  2. Hypertension, well controlled.  3. Exertional presyncope.  I am not sure what to make of this.  I am      wondering if she might be a little bit of volume depleted.  We will      switch her Diovan HCT just to Diovan 80 mg a day and see how she      does.  4. Carotid artery stenosis.  These are nonobstructive.  She will need      followup  every year and two.     [name] [name]. Bensimhon, MD  Electronically Signed    [name] [name]/MedQ  DD: 04/04/2008  DT: 04/04/2008  Job #: [id number]

## 2010-09-21 NOTE — Assessment & Plan Note (Signed)
[facility]                            CARDIOLOGY OFFICE NOTE   NAME:[name] [name], [name] [name]                       MRN:          [id number]  DATE:10/10/2007                            DOB:          1940/11/27    PRIMARY CARE PHYSICIAN:  Dr. Carlota Raspberry in [place], [place].   OVERALL HISTORY:  [name] [name] is a very pleasant 70 year old woman with  a history of hypertension, COPD, hyperlipidemia and mild hypertrophic  cardiomyopathy without significant obstruction.  Echocardiogram showed  an EF of 70%, septum was 1.7 cm.  There was mild chordal SAM, but no  significant outflow tract gradient.  She did have a CTX test, which  showed a normal blood pressure response to exercise with relatively  preserved VO2.   She says she is doing better.  She is dancing several nights a week  without any problems.  She notes that if she walks up a hill fast she  gets a little lightheaded, but has not had any syncope.  No  palpitations.  No chest pain.   CURRENT MEDICATIONS:  1. Diovan-HCTZ 80/12.5 a day.  2. Alendronate.  3. Simvastatin 40.   ALLERGIES/INTOLERANCES:  ACE INHIBITORS CAUSE A COUGH.   PHYSICAL EXAMINATION:  GENERAL:  She is well-appearing in no acute  distress, ambulates around the clinic without any respiratory  difficulty.  VITAL SIGNS:  Blood pressure initially was 129/76, on manual recheck,  152/86.  Heart rate 72.  Weight is 173.  HEENT:  Normal.  NECK:  Supple.  There is no JVD.  Carotids are 2+ bilaterally.  There is  a faint bruit on the right.  CARDIAC:  PMI is nondisplaced.  She is  regular with an S4.  She has a systolic ejection murmur at the right  sternal border, which increases just minimally with Valsalva.  LUNGS:  Clear with mildly decreased air movement throughout.  ABDOMEN:  Obese, nontender, nondistended.  No hepatosplenomegaly.  No  bruits, no masses.  Good bowel sounds.  EXTREMITIES:  Warm with no cyanosis, clubbing or  edema.  NEURO:  Alert and oriented x3.  Cranial nerves II-XII are intact.  Moves  all fours extremities without difficulty.  Affect is pleasant.   Carotid ultrasound shows a 40-59% stenosis on the right and 0-39% on the  left.   ASSESSMENT/PLAN:  1. Probable hypertrophic cardiomyopathy.  This is relatively      asymptomatic.  I suspect most of her dyspnea is due to her chronic      obstructive pulmonary disease.  She does not have any significant      risk factors for sudden cardiac death.  We will add low-dose Toprol      25 mg a day and see how she tolerates.  2. Hypertension.  Blood pressure is mildly elevated.  She will follow      with her primary care physician.   DISPOSITION:  We will see her back in 6 months for routine follow-up.  She will get an echocardiogram in the next month or so for routine  follow-up.     Bevelyn Buckles.  Bensimhon, MD  Electronically Signed    DRB/MedQ  DD: 10/10/2007  DT: 10/10/2007  Job #: [id number]   cc:   Carlota Raspberry

## 2010-09-21 NOTE — Assessment & Plan Note (Signed)
[facility]                            CARDIOLOGY OFFICE NOTE   NAME:Blackwelder, CHI                       MRN:          [id number]  DATE:02/28/2007                            DOB:          April 03, 1941    PRIMARY CARE PHYSICIAN:  Dr. Carlota Raspberry.   INTERVAL HISTORY:  Ms. 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  Echocardiogram showed an ejection fraction of 70%.  The septum was 1.7  cm with posterior wall of 0.86 cm.  She did have mild chordal SAM, but  no significant outflow tract gradient.   She returns today for routine followup.  In general, she feels okay.  She does note that when she walks up a hill and tries to move a bit  faster, she gets short of breath.  She denies any chest pain.  No lower  extremity edema.  She had a Myoview recently that showed an EF of 74%  with no ischemia.  She has not had orthopnea or PND.   CURRENT MEDICATIONS:  1. Diovan hydrochlorothiazide 80/12.5 a day.  2. Lexapro 5 a day.  3. Simvastatin 20 a day.  4. Alendronate 70 mg a week.   PHYSICAL EXAMINATION:  She is well appearing, in no acute distress.  She  ambulates around the clinic without any respiratory difficulty.  Blood pressure is 120/70.  Heart rate is 56.  Weight is 170.  HEENT:  Normal.  NECK:  Supple.  There is no JVD.  Carotids are 2+ bilaterally.  There is  a question of faint bruit on the right.  CARDIAC:  PMI is non-displaced.  She is regular with an S4.  A very soft  systolic ejection murmur at the right sternal border, which does not  increase with Valsalva.  LUNGS:  Clear with mildly decreased air movement throughout.  ABDOMEN:  Obese, nontender, non-distended.  There is no  hepatosplenomegaly.  No bruits.  No masses.  EXTREMITIES:  Warm with no cyanosis, clubbing, or edema.  Good distal  pulses.  NEUROLOGIC:  Alert and oriented x3.  Cranial nerves 2 through 12 are  intact.  Moves all 4 extremities without difficulty.  Affect is  pleasant.   ASSESSMENT:  1. Dyspnea.  I suspect this is mostly due to deconditioning and      possibly chronic obstructive pulmonary disease, but she does have      components of hypertrophic cardiomyopathy.  We will proceed with      cardiopulmonary exercise testing to further delineate the etiology      for her dyspnea.  I have also recommended she get involved with an      exercise program.  2. Right carotid bruit.  Check a carotid ultrasound.  3. Hypertension.  Well controlled.   DISPOSITION:  We will see her back in several months, and further course  pending results of her CPX test.     [name], MD  Electronically Signed    DRB/MedQ  DD: 02/28/2007  DT: 03/01/2007  Job #: [id number]   cc:   Carlota Raspberry

## 2011-05-31 ENCOUNTER — Ambulatory Visit (INDEPENDENT_AMBULATORY_CARE_PROVIDER_SITE_OTHER): Payer: No Typology Code available for payment source | Admitting: Internal Medicine

## 2011-05-31 ENCOUNTER — Encounter: Payer: Self-pay | Admitting: Internal Medicine

## 2011-05-31 VITALS — BP 132/64 | HR 56 | Ht 63.0 in | Wt 173.8 lb

## 2011-05-31 DIAGNOSIS — R0602 Shortness of breath: Secondary | ICD-10-CM

## 2011-05-31 DIAGNOSIS — I10 Essential (primary) hypertension: Secondary | ICD-10-CM

## 2011-05-31 DIAGNOSIS — I2510 Atherosclerotic heart disease of native coronary artery without angina pectoris: Secondary | ICD-10-CM

## 2011-05-31 DIAGNOSIS — R42 Dizziness and giddiness: Secondary | ICD-10-CM | POA: Insufficient documentation

## 2011-05-31 NOTE — Assessment & Plan Note (Signed)
Suspect may have component of neurocardiogenic syncope (vasodepresor). No evidence of high grade arrhythmia.

## 2011-05-31 NOTE — Assessment & Plan Note (Signed)
Doing well. Stable. No further work-up at this time. Encouraged her to continue with weight loss efforts.

## 2011-05-31 NOTE — Progress Notes (Signed)
HPI:  [name] [name] is a very pleasant 71 year-old woman with a history of hypertension, anxiety, COPD, hyperlipidemia, CAD s/p DES to LAD 11/10 and mild hypertrophic cardiomyopathy without significant obstruction. We have followed her for dyspnea and presyncope.  Echocardiogram has an EF of 70% with septum being 1.7 cm.  There was mild chordal SAM, but no significant outflow tract gradient.  She had a CPX test which showed normal blood pressure response to exercise with relatively preserved VO2.  There is a question of mild exercise-induced bronchospasm, but she has not responded well to inhalers in the past.  In past she had stress echo with normal BP response and no significant LVOT gradient. Holter normal. I referred her to Dr. Graciela Husbands for consideration of ILR. Given the frequency of her events he decided to repeat Holter. She had several episodes of symptoms while wearing monitor but all corresponded to SR. Felt like symptoms might be related to autonomic dysfunction.  She returns today for routine followup. Continues to have spells that when she is walking gets SOB and then feels presyncopal. No change. Spells are totally unpredictabale. Very intermittent. No frank  loss of consciousness. No CP or palpitations. Occasionally burning in shoulder and chest. Does have mild dyspnea when she walks quickly.  Trying to lose weight.Weigt stable 173-175.     ROS: All systems negative except as listed in HPI, PMH and Problem List.  Past Medical History  Diagnosis Date  . HTN (hypertension)   . Hyperlipidemia   . Hypertrophic cardiomyopathy     Mild hypertrophic CM without outflow tract obstruction -- ETT/Myoview EF 74%. no ischemia (12/2006)  --stress echo 8/10: normal BP response to exercise. no presyncope  no wall motion abnormalties. No significant LVOT obstruction. non-diagnositc ST depression  --holter monitor normal   . CAD (coronary artery disease)     --s/p DES LAD and POBA diagonal 11/10  --RHC cath  normal  . COPD (chronic obstructive pulmonary disease)   . Obesity   . Pre-syncope     Current Outpatient Prescriptions  Medication Sig Dispense Refill  . aspirin 81 MG EC tablet Take 81 mg by mouth daily.        . Calcium Carbonate-Vitamin D (CALCIUM 600 + D PO) Take 600 mg by mouth daily.      Marland Kitchen losartan (COZAAR) 100 MG tablet Take 100 mg by mouth daily.        . metoprolol tartrate (LOPRESSOR) 25 MG tablet Take 1/2 a tab by mouth twice a day.       . simvastatin (ZOCOR) 80 MG tablet Take 80 mg by mouth at bedtime.           PHYSICAL EXAM: Filed Vitals:   05/31/11 1117  BP: 132/64  Pulse: 56   General:  well appearing. no resp difficulty HEENT: normal Neck: supple. no JVD. Carotids 2+ bilat; no bruits. No lymphadenopathy or thryomegaly appreciated. Cor: PMI nondisplaced. Huston Foley regular.  2//6 SEM RSB Lungs: clear Abdomen: obese. soft, nontender, nondistended. No hepatosplenomegaly. No bruits or masses. Good bowel sounds. Extremities: no cyanosis, clubbing, rash. tr edema. Neuro: alert & orientedx3, cranial nerves grossly intact. moves all 4 extremities w/o difficulty. affect pleasant   ECG: Sinus brady 56 Trivial ST scooping    ASSESSMENT & PLAN:

## 2011-05-31 NOTE — Patient Instructions (Signed)
Your physician wants you to follow-up in:  12 months.  You will receive a reminder letter in the mail two months in advance. If you don't receive a letter, please call our office to schedule the follow-up appointment.   

## 2011-05-31 NOTE — Assessment & Plan Note (Signed)
No evidence of ischemia. Continue current regimen.   

## 2011-05-31 NOTE — Assessment & Plan Note (Addendum)
Blood pressure well controlled. Continue current regimen.  

## 2012-04-24 ENCOUNTER — Encounter: Payer: Self-pay | Admitting: Cardiovascular Disease

## 2012-05-30 ENCOUNTER — Ambulatory Visit: Payer: No Typology Code available for payment source | Admitting: Cardiovascular Disease

## 2012-07-20 ENCOUNTER — Ambulatory Visit (INDEPENDENT_AMBULATORY_CARE_PROVIDER_SITE_OTHER): Payer: Medicare Other | Admitting: Cardiovascular Disease

## 2012-07-20 ENCOUNTER — Encounter: Payer: Self-pay | Admitting: Cardiovascular Disease

## 2012-07-20 VITALS — BP 130/70 | HR 63 | Ht 63.0 in | Wt 176.4 lb

## 2012-07-20 DIAGNOSIS — R42 Dizziness and giddiness: Secondary | ICD-10-CM

## 2012-07-20 DIAGNOSIS — R0602 Shortness of breath: Secondary | ICD-10-CM

## 2012-07-20 DIAGNOSIS — I422 Other hypertrophic cardiomyopathy: Secondary | ICD-10-CM

## 2012-07-20 DIAGNOSIS — I10 Essential (primary) hypertension: Secondary | ICD-10-CM

## 2012-07-20 DIAGNOSIS — I2510 Atherosclerotic heart disease of native coronary artery without angina pectoris: Secondary | ICD-10-CM

## 2012-07-20 NOTE — Progress Notes (Signed)
HPI:  72 year old woman presenting for followup evaluation. She's been followed by Dr. Gala Romney, no be assuming her cardiac care from here forward. She has a history of coronary artery disease and hypertrophic cardiomyopathy without LV outflow tract obstruction. She has chronic dyspnea and presyncope.  She tells me about 2 months ago hydrochlorothiazide was added to her antihypertensive regimen. Her blood pressure has been better controlled since that time. Her dizziness has been worse. She has near syncope with physical exertion, but only on an intermittent basis. She has not had frank syncope. When she feels weak and dizzy, she is able to stand still until it passes. She denies chest pain or pressure. She does admit to dyspnea with exertion. She's gained about 20 pounds over the past few years. She's had no palpitations recently. She has no other complaints. She has reduced her activity level significantly.  Outpatient Encounter Prescriptions as of 07/20/2012  Medication Sig Dispense Refill  . aspirin 81 MG EC tablet Take 81 mg by mouth daily.        . Calcium Carbonate-Vitamin D (CALCIUM 600 + D PO) Take 600 mg by mouth daily.      Marland Kitchen losartan (COZAAR) 100 MG tablet Take 100 mg by mouth daily.        . metoprolol tartrate (LOPRESSOR) 25 MG tablet Take 1/2 a tab by mouth twice a day.       . simvastatin (ZOCOR) 80 MG tablet Take 80 mg by mouth at bedtime.         No facility-administered encounter medications on file as of 07/20/2012.    Allergies  Allergen Reactions  . Morphine   . Penicillins     Past Medical History  Diagnosis Date  . HTN (hypertension)   . Hyperlipidemia   . Hypertrophic cardiomyopathy     Mild hypertrophic CM without outflow tract obstruction -- ETT/Myoview EF 74%. no ischemia (12/2006)  --stress echo 8/10: normal BP response to exercise. no presyncope  no wall motion abnormalties. No significant LVOT obstruction. non-diagnositc ST depression  --holter monitor  normal   . CAD (coronary artery disease)     --s/p DES LAD and POBA diagonal 11/10  --RHC cath normal  . COPD (chronic obstructive pulmonary disease)   . Obesity   . Pre-syncope     ROS: Negative except as per HPI  BP 130/70  Pulse 63  Ht 5\' 3"  (1.6 m)  Wt 80.015 kg (176 lb 6.4 oz)  BMI 31.26 kg/m2  SpO2 98%  PHYSICAL EXAM: Pt is alert and oriented, pleasant, overweight woman in NAD HEENT: normal Neck: JVP - normal, carotids 2+= without bruits Lungs: CTA bilaterally CV: RRR with grade 2/6 systolic ejection murmur at the right upper sternal border, there is no significant change with positional maneuver. Abd: soft, NT, Positive BS, no hepatomegaly Ext: no C/C/E, distal pulses intact and equal Skin: warm/dry no rash  EKG:  Normal sinus rhythm 59 beats per minute, nonspecific ST-T wave abnormality, mildly prolonged QT.  ASSESSMENT AND PLAN: 1. Dizziness/presyncope. She has undergone extensive evaluation in the past without clear cause for these symptoms. It has been nearly 4 years since her last echocardiogram and I will repeat this. Her systolic murmur sounds fairly benign on exam and she has not had a history of significant LV outflow tract obstruction. I asked her to push fluids. We may need to consider stopping her diuretic and managing blood pressure with increased beta blockade, but I would like to see the results  of her echo first.  2. Hypertrophic cardiomyopathy. Plan as above. Will repeat echocardiogram.  3. Essential hypertension. Blood pressure is well controlled on her current medical program.  4. Hyperlipidemia. Lipids are followed by her primary care physician.  Tonny Bollman 07/20/2012 12:47 PM

## 2012-07-20 NOTE — Patient Instructions (Addendum)
Your physician has requested that you have an echocardiogram. Echocardiography is a painless test that uses sound waves to create images of your heart. It provides your doctor with information about the size and shape of your heart and how well your heart's chambers and valves are working. This procedure takes approximately one hour. There are no restrictions for this procedure.  Your physician recommends that you schedule a follow-up appointment in: 2 MONTHS with Dr Excell Seltzer  Your physician recommends that you continue on your current medications as directed. Please refer to the Current Medication list given to you today.

## 2012-07-25 ENCOUNTER — Ambulatory Visit (HOSPITAL_COMMUNITY): Payer: Medicare Other | Attending: Cardiovascular Disease | Admitting: Radiology

## 2012-07-25 DIAGNOSIS — J4489 Other specified chronic obstructive pulmonary disease: Secondary | ICD-10-CM | POA: Insufficient documentation

## 2012-07-25 DIAGNOSIS — I10 Essential (primary) hypertension: Secondary | ICD-10-CM | POA: Insufficient documentation

## 2012-07-25 DIAGNOSIS — I428 Other cardiomyopathies: Secondary | ICD-10-CM | POA: Insufficient documentation

## 2012-07-25 DIAGNOSIS — I422 Other hypertrophic cardiomyopathy: Secondary | ICD-10-CM

## 2012-07-25 DIAGNOSIS — E785 Hyperlipidemia, unspecified: Secondary | ICD-10-CM | POA: Insufficient documentation

## 2012-07-25 DIAGNOSIS — I2510 Atherosclerotic heart disease of native coronary artery without angina pectoris: Secondary | ICD-10-CM | POA: Insufficient documentation

## 2012-07-25 DIAGNOSIS — I359 Nonrheumatic aortic valve disorder, unspecified: Secondary | ICD-10-CM | POA: Insufficient documentation

## 2012-07-25 DIAGNOSIS — J449 Chronic obstructive pulmonary disease, unspecified: Secondary | ICD-10-CM | POA: Insufficient documentation

## 2012-07-25 NOTE — Progress Notes (Signed)
Echocardiogram performed.  

## 2012-08-07 ENCOUNTER — Telehealth: Payer: Self-pay | Admitting: Cardiovascular Disease

## 2012-08-07 NOTE — Telephone Encounter (Signed)
I spoke with the pt and made her aware that due to increased episodes of lightheadedness the pt should stop HCTZ per Dr Excell Seltzer.  The pt was instructed to monitor her BP 2 times per week and contact the office if her BP is consistently above 140/90. Pt verbalized understanding.

## 2012-08-07 NOTE — Telephone Encounter (Signed)
New Problem:    Patient called in returning your call regarding her latest ECHO results.  Please call back.

## 2012-08-21 ENCOUNTER — Telehealth: Payer: Self-pay | Admitting: *Deleted

## 2012-08-21 DIAGNOSIS — I2510 Atherosclerotic heart disease of native coronary artery without angina pectoris: Secondary | ICD-10-CM

## 2012-08-21 MED ORDER — METOPROLOL TARTRATE 25 MG PO TABS: 25.0000 mg | ORAL_TABLET | Freq: Two times a day (BID) | ORAL | Status: DC

## 2012-08-21 NOTE — Telephone Encounter (Signed)
Pt in office today with family member who had office visit with Dr. Excell Seltzer.  She had questions regarding medications. Reviewed with Dr.Cooper who gave instructions for pt to stop HCTZ and increase metoprolol tartrate to 25 mg by mouth twice daily. I verbally gave these instructions to pt and wrote instructions down and gave to her.  Prescription for metoprolol sent to [facility].

## 2012-09-26 ENCOUNTER — Encounter: Payer: Self-pay | Admitting: Cardiovascular Disease

## 2012-09-26 ENCOUNTER — Ambulatory Visit (INDEPENDENT_AMBULATORY_CARE_PROVIDER_SITE_OTHER): Payer: Medicare Other | Admitting: Cardiovascular Disease

## 2012-09-26 VITALS — BP 130/58 | HR 84 | Ht 62.0 in | Wt 167.0 lb

## 2012-09-26 DIAGNOSIS — R0609 Other forms of dyspnea: Secondary | ICD-10-CM

## 2012-09-26 DIAGNOSIS — R0600 Dyspnea, unspecified: Secondary | ICD-10-CM

## 2012-09-26 DIAGNOSIS — I2510 Atherosclerotic heart disease of native coronary artery without angina pectoris: Secondary | ICD-10-CM

## 2012-09-26 NOTE — Patient Instructions (Addendum)
Your physician has requested that you have a lexiscan myoview. For further information please visit [email or web address]. Please follow instruction sheet, as given.  Your physician wants you to follow-up in: 6 MONTHS with Dr Cooper.  You will receive a reminder letter in the mail two months in advance. If you don't receive a letter, please call our office to schedule the follow-up appointment.  Your physician recommends that you continue on your current medications as directed. Please refer to the Current Medication list given to you today.  

## 2012-09-26 NOTE — Progress Notes (Signed)
HPI:  72 year old woman presenting for followup evaluation. The patient has coronary artery disease and hypertrophic cardiomyopathy without left ventricular outflow tract obstruction. She's had hypertension as well. She's had problems with recurrent lightheadedness. She has not had frank syncope.  She's been under a great deal of stress recently. She lost her brother a few weeks ago to suicide. Her sisters had a stroke within the last year. She's been very worried about her cardiac situation. She complains of dyspnea with exertion. Her lightheadedness is been improved since she stopped hydrochlorothiazide as one of her antihypertensive medicines. She denies leg swelling, orthopnea, or PND. She feels like she "gives out" with low-level activity. She does not have pain or pressure in her chest with exertion. However she does admit to some chest discomfort with emotional stress. This is nonradiating. She has no other complaints today.  Outpatient Encounter Prescriptions as of 09/26/2012  Medication Sig Dispense Refill  . aspirin 81 MG EC tablet Take 81 mg by mouth daily.        . Calcium Carbonate-Vitamin D (CALCIUM 600 + D PO) Take 600 mg by mouth daily.      Marland Kitchen losartan (COZAAR) 100 MG tablet Take 100 mg by mouth daily.        . metoprolol tartrate (LOPRESSOR) 25 MG tablet Take 1 tablet (25 mg total) by mouth 2 (two) times daily.  60 tablet  6  . Multiple Vitamin (MULTIVITAMIN) tablet Take 1 tablet by mouth daily.      . simvastatin (ZOCOR) 80 MG tablet Take 80 mg by mouth at bedtime.         No facility-administered encounter medications on file as of 09/26/2012.    Allergies  Allergen Reactions  . Morphine   . Penicillins     Past Medical History  Diagnosis Date  . HTN (hypertension)   . Hyperlipidemia   . Hypertrophic cardiomyopathy     Mild hypertrophic CM without outflow tract obstruction -- ETT/Myoview EF 74%. no ischemia (12/2006)  --stress echo 8/10: normal BP response to exercise.  no presyncope  no wall motion abnormalties. No significant LVOT obstruction. non-diagnositc ST depression  --holter monitor normal   . CAD (coronary artery disease)     --s/p DES LAD and POBA diagonal 11/10  --RHC cath normal  . COPD (chronic obstructive pulmonary disease)   . Obesity   . Pre-syncope     ROS: Negative except as per HPI  BP 130/58  Pulse 84  Ht 5\' 2"  (1.575 m)  Wt 75.751 kg (167 lb)  BMI 30.54 kg/m2  PHYSICAL EXAM: Pt is alert and oriented, pleasant woman in NAD HEENT: normal Neck: JVP - normal, carotids 2+= without bruits Lungs: CTA bilaterally CV: RRR with grade 2/6 systolic ejection murmur at the left upper sternal border Abd: soft, NT, Positive BS, no hepatomegaly Ext: no C/C/E, distal pulses intact and equal Skin: warm/dry no rash  2-D echo 07/25/2012: Left ventricle: Technically difficult study. EF is 65%. There is a mild intracavitary gradient. No LVOT obstruction. The cavity size was mildly reduced. Wall thickness was increased increased in a pattern of mild to moderate LVH. The estimated ejection fraction was 65%. Wall motion was normal; there were no regional wall motion abnormalities. Doppler parameters are consistent with high ventricular filling pressure.  ------------------------------------------------------------ Aortic valve: Sclerosis without stenosis.  ------------------------------------------------------------ Mitral valve: Calcified annulus. Doppler: Mild regurgitation. Peak gradient: 3mm Hg (D).  ------------------------------------------------------------ Left atrium: The atrium was mildly dilated.  ------------------------------------------------------------ Right ventricle: The  cavity size was normal. Systolic function was normal.  ------------------------------------------------------------ Pulmonic valve: The valve appears to be grossly normal. Doppler: No significant  regurgitation.  ------------------------------------------------------------ Tricuspid valve: Structurally normal valve. Leaflet separation was normal. Doppler: Transvalvular velocity was within the normal range. No regurgitation.  ------------------------------------------------------------ Right atrium: The atrium was at the upper limits of normal in size.  ------------------------------------------------------------ Pericardium: There was no pericardial effusion.  ASSESSMENT AND PLAN: 1. Hypertension. Blood pressure is controlled on her current program. She seems to be tolerating discontinuation of hydrochlorothiazide. We'll continue losartan metoprolol.  2. Exertional dyspnea and chest discomfort with emotional stress. I went back and reviewed her past history again. She had cardiac catheterization performed in 2010 for similar symptoms. She was found to have severe LAD stenosis and was treated with stenting. She also had severe diagonal stenosis treated with balloon angioplasty. I recommended a stress Myoview to evaluate for ischemia. She's gained a lot of weight and I think this is also contributing to her dyspnea.  3. Hyperlipidemia. She's been on simvastatin for many years and is tolerating this. She's followed by Dr. Ignacia Palma.  Tonny Bollman 09/26/2012 3:19 PM

## 2012-09-28 ENCOUNTER — Telehealth: Payer: Self-pay | Admitting: Cardiovascular Disease

## 2012-10-10 ENCOUNTER — Ambulatory Visit (HOSPITAL_COMMUNITY): Payer: Medicare Other | Attending: Cardiology | Admitting: Radiology

## 2012-10-10 VITALS — BP 144/63 | HR 62 | Ht 62.0 in | Wt 175.0 lb

## 2012-10-10 DIAGNOSIS — I4949 Other premature depolarization: Secondary | ICD-10-CM

## 2012-10-10 DIAGNOSIS — R002 Palpitations: Secondary | ICD-10-CM | POA: Insufficient documentation

## 2012-10-10 DIAGNOSIS — J4489 Other specified chronic obstructive pulmonary disease: Secondary | ICD-10-CM | POA: Insufficient documentation

## 2012-10-10 DIAGNOSIS — Z9861 Coronary angioplasty status: Secondary | ICD-10-CM | POA: Insufficient documentation

## 2012-10-10 DIAGNOSIS — Z87891 Personal history of nicotine dependence: Secondary | ICD-10-CM | POA: Insufficient documentation

## 2012-10-10 DIAGNOSIS — R5381 Other malaise: Secondary | ICD-10-CM | POA: Insufficient documentation

## 2012-10-10 DIAGNOSIS — R0602 Shortness of breath: Secondary | ICD-10-CM

## 2012-10-10 DIAGNOSIS — R55 Syncope and collapse: Secondary | ICD-10-CM | POA: Insufficient documentation

## 2012-10-10 DIAGNOSIS — I10 Essential (primary) hypertension: Secondary | ICD-10-CM | POA: Insufficient documentation

## 2012-10-10 DIAGNOSIS — R000 Tachycardia, unspecified: Secondary | ICD-10-CM | POA: Insufficient documentation

## 2012-10-10 DIAGNOSIS — R0600 Dyspnea, unspecified: Secondary | ICD-10-CM

## 2012-10-10 DIAGNOSIS — I2510 Atherosclerotic heart disease of native coronary artery without angina pectoris: Secondary | ICD-10-CM

## 2012-10-10 DIAGNOSIS — J449 Chronic obstructive pulmonary disease, unspecified: Secondary | ICD-10-CM | POA: Insufficient documentation

## 2012-10-10 DIAGNOSIS — R079 Chest pain, unspecified: Secondary | ICD-10-CM | POA: Insufficient documentation

## 2012-10-10 DIAGNOSIS — E785 Hyperlipidemia, unspecified: Secondary | ICD-10-CM | POA: Insufficient documentation

## 2012-10-10 DIAGNOSIS — R0989 Other specified symptoms and signs involving the circulatory and respiratory systems: Secondary | ICD-10-CM | POA: Insufficient documentation

## 2012-10-10 DIAGNOSIS — I422 Other hypertrophic cardiomyopathy: Secondary | ICD-10-CM | POA: Insufficient documentation

## 2012-10-10 DIAGNOSIS — R0609 Other forms of dyspnea: Secondary | ICD-10-CM | POA: Insufficient documentation

## 2012-10-10 DIAGNOSIS — R42 Dizziness and giddiness: Secondary | ICD-10-CM | POA: Insufficient documentation

## 2012-10-10 MED ORDER — TECHNETIUM TC 99M SESTAMIBI GENERIC - CARDIOLITE
33.0000 | Freq: Once | INTRAVENOUS | Status: AC | PRN
  Administered 2012-10-10: 33 via INTRAVENOUS

## 2012-10-10 MED ORDER — REGADENOSON 0.4 MG/5ML IV SOLN
0.4000 mg | Freq: Once | INTRAVENOUS | Status: AC
  Administered 2012-10-10: 0.4 mg via INTRAVENOUS

## 2012-10-10 MED ORDER — TECHNETIUM TC 99M SESTAMIBI GENERIC - CARDIOLITE
11.0000 | Freq: Once | INTRAVENOUS | Status: AC | PRN
  Administered 2012-10-10: 11 via INTRAVENOUS

## 2012-10-10 NOTE — Progress Notes (Signed)
[facility] NUCLEAR MED [address] [place], [place] [address] [phone]    Cardiology Nuclear Med Study  [name] [name] is a 72 y.o. female     MRN : [id number]     DOB: 1940-08-23  Procedure Date: 10/10/2012  Nuclear Med Background Indication for Stress Test:  Evaluation for Ischemia and PTCA/Stent Patency History:  '08 MPS-normal, EF 74%; 8/10 Stress Echo-negative; 11/10 Cath-Severe LAD and DX1 stenoses>PTCA DX1 and Stent LAD;  3/14 Echo-EF 65% with mild-mod LVH; Hypertrophic cardiomyopathy; COPD Cardiac Risk Factors: History of Smoking, Hypertension and Lipids  Symptoms:  Chest Pain with emotional stress, Dizziness, DOE, Fatigue with Exertion, Light-Headedness, Near Syncope, Palpitations and Rapid HR   Nuclear Pre-Procedure Caffeine/Decaff Intake:  None > 12 hrs NPO After: 8:00pm   Lungs:  clear O2 Sat: 98% on room air. IV 0.9% NS with Angio Cath:  20g  IV Site: R Antecubital x 1, tolerated well IV Started by:  Irean Hong, RN  Chest Size (in):  38 Cup Size: C  Height: 5\' 2"  (1.575 m)  Weight:  175 lb (79.379 kg)  BMI:  Body mass index is 32 kg/(m^2). Tech Comments: Lopressor held  x 24 hours; took Losartan this am    Nuclear Med Study 1 or 2 day study: 1 day  Stress Test Type:  Treadmill/Lexiscan  Reading MD: Cassell Clement, MD  Order Authorizing Provider:  Tonny Bollman, MD  Resting Radionuclide: Technetium 18m Sestamibi  Resting Radionuclide Dose: 10.8 mCi   Stress Radionuclide:  Technetium 84m Sestamibi  Stress Radionuclide Dose: 33.0 mCi           Stress Protocol Rest HR: 62 Stress HR: 98  Rest BP: 144/62 Stress BP: 154/56  Exercise Time (min): 2:00 METS: n/a   Predicted Max HR: 146 bpm % Max HR: 67.12 bpm Rate Pressure Product: 51884   Dose of Adenosine (mg):  n/a Dose of Lexiscan: 0.4 mg  Dose of Atropine (mg): n/a Dose of Dobutamine: n/a mcg/kg/min (at max HR)  Stress Test Technologist: Smiley Houseman, CMA-N  Nuclear  Technologist:  Domenic Polite, CNMT     Rest Procedure:  Myocardial perfusion imaging was performed at rest 45 minutes following the intravenous administration of Technetium 66m Sestamibi.  Rest ECG: NSR with non-specific ST-T wave changes  Stress Procedure:  The patient received IV Lexiscan 0.4 mg over 15-seconds with concurrent low level exercise and then Technetium 37m Sestamibi was injected at 30-seconds while the patient continued walking one more minute.  Quantitative spect images were obtained after a 45-minute delay.  Stress ECG: No significant change from baseline ECG  QPS Raw Data Images:  Normal; no motion artifact; normal heart/lung ratio. Stress Images:  Normal homogeneous uptake in all areas of the myocardium. Rest Images:  Normal homogeneous uptake in all areas of the myocardium. Subtraction (SDS):  No evidence of ischemia. Transient Ischemic Dilatation (Normal <1.22):  1.12 Lung/Heart Ratio (Normal <0.45):  0.28  Quantitative Gated Spect Images QGS EDV:  72 ml QGS ESV:  21 ml  Impression Exercise Capacity:  Lexiscan with low level exercise. BP Response:  Normal blood pressure response. Clinical Symptoms:  No chest pain. ECG Impression:  No significant ECG changes with Lexiscan. Comparison with Prior Nuclear Study: No significant change from previous study  Overall Impression:  Normal stress nuclear study.  LV Ejection Fraction: 71%.  LV Wall Motion:  NL LV Function; NL Wall Motion   [employer]

## 2012-10-17 ENCOUNTER — Encounter: Payer: Self-pay | Admitting: Cardiovascular Disease

## 2012-10-17 NOTE — Telephone Encounter (Signed)
New Problem:    Patient called in returning your call regarding her latest stress test results.  Please call back.

## 2012-10-17 NOTE — Telephone Encounter (Signed)
This encounter was created in error - please disregard.

## 2012-11-08 ENCOUNTER — Telehealth: Payer: Self-pay | Admitting: Cardiovascular Disease

## 2012-11-08 NOTE — Telephone Encounter (Signed)
rec'd records from [facility] forward 3 pages to Dr.Cooper

## 2013-03-28 ENCOUNTER — Other Ambulatory Visit: Payer: Self-pay | Admitting: Cardiovascular Disease

## 2013-05-20 ENCOUNTER — Ambulatory Visit: Payer: Medicare Other | Admitting: Cardiovascular Disease

## 2013-05-22 ENCOUNTER — Encounter: Payer: Self-pay | Admitting: Cardiovascular Disease

## 2013-05-22 ENCOUNTER — Ambulatory Visit (INDEPENDENT_AMBULATORY_CARE_PROVIDER_SITE_OTHER): Payer: Medicare Other | Admitting: Cardiovascular Disease

## 2013-05-22 VITALS — BP 146/62 | HR 68 | Ht 62.0 in | Wt 183.0 lb

## 2013-05-22 DIAGNOSIS — I10 Essential (primary) hypertension: Secondary | ICD-10-CM

## 2013-05-22 DIAGNOSIS — R0602 Shortness of breath: Secondary | ICD-10-CM

## 2013-05-22 DIAGNOSIS — I2510 Atherosclerotic heart disease of native coronary artery without angina pectoris: Secondary | ICD-10-CM

## 2013-05-22 MED ORDER — METOPROLOL TARTRATE 25 MG PO TABS: 25.0000 mg | ORAL_TABLET | Freq: Two times a day (BID) | ORAL | Status: DC

## 2013-05-22 NOTE — Progress Notes (Signed)
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 She's also followed for hypertension.  The patient's lightheadedness has improved since she discontinued hydrochlorothiazide. She continues to have exertional dyspnea. She does not have palpitations, chest pain, orthopnea, or PND. There has been no edema. She complains of generalized fatigue.  She's recently been limited by low back pain and feels like her legs "give out" with much activity.  Outpatient Encounter Prescriptions as of 05/22/2013  Medication Sig  . aspirin 81 MG EC tablet Take 81 mg by mouth daily.    . Calcium Carbonate-Vitamin D (CALCIUM 600 + D PO) Take 600 mg by mouth daily.  Marland Kitchen denosumab (PROLIA) 60 MG/ML SOLN injection Inject 60 mg into the skin every 6 (six) months. Administer in upper arm, thigh, or abdomen  . losartan (COZAAR) 100 MG tablet Take 100 mg by mouth daily.    . metoprolol tartrate (LOPRESSOR) 25 MG tablet TAKE ONE TABLET (25 MG TOTAL) BY MOUTH TWO TIMES A DAY  . simvastatin (ZOCOR) 80 MG tablet Take 80 mg by mouth at bedtime.    . [DISCONTINUED] Multiple Vitamin (MULTIVITAMIN) tablet Take 1 tablet by mouth daily.    Allergies  Allergen Reactions  . Morphine   . Penicillins     Past Medical History  Diagnosis Date  . HTN (hypertension)   . Hyperlipidemia   . Hypertrophic cardiomyopathy     Mild hypertrophic CM without outflow tract obstruction -- ETT/Myoview EF 74%. no ischemia (12/2006)  --stress echo 8/10: normal BP response to exercise. no presyncope  no wall motion abnormalties. No significant LVOT obstruction. non-diagnositc ST depression  --holter monitor normal   . CAD (coronary artery disease)     --s/p DES LAD and POBA diagonal 11/10  --RHC cath normal  . COPD (chronic obstructive pulmonary disease)   . Obesity   . Pre-syncope    ROS: Negative except as per HPI  BP 146/62  Pulse 68   Ht 5\' 2"  (1.575 m)  Wt 183 lb (83.008 kg)  BMI 33.46 kg/m2  PHYSICAL EXAM: Pt is alert and oriented, NAD HEENT: normal Neck: JVP - normal, carotids 2+= without bruits Lungs: CTA bilaterally CV: RRR with a grade 3/6 systolic murmur at the left sternal border Abd: soft, NT, Positive BS, no hepatomegaly Ext: no C/C/E, distal pulses intact and equal Skin: warm/dry no rash  EKG:  Normal sinus rhythm 68 beats per minute, right bundle branch block, left axis deviation.  Stress Myoview in 10/10/2012: Overall Impression: Normal stress nuclear study.  LV Ejection Fraction: 71%. LV Wall Motion: NL LV Function; NL Wall Motion   2-D echocardiogram 07/25/2012: Study Conclusions - Left ventricle: Technically difficult study. EF is 65%. There is a mild intracavitary gradient. No LVOT obstruction. The cavity size was mildly reduced. Wall thickness was increased increased in a pattern of mild to moderate LVH. The estimated ejection fraction was 65%. Wall motion was normal; there were no regional wall motion abnormalities. Doppler parameters are consistent with high ventricular filling pressure. - Aortic valve: Sclerosis without stenosis. - Mitral valve: Calcified annulus. Mild regurgitation. - Left atrium: The atrium was mildly dilated.  ASSESSMENT AND PLAN: 1. Coronary artery disease. Patient has undergone PCI of the LAD. Recent stress testing was negative for ischemia. Continue with medical management.  2. Hypertension. Blood pressure is well controlled with losartan and metoprolol.  3. Hypertrophic cardiomyopathy without evidence of LV outflow obstruction. Continue observation. Echocardiogram  from last year reviewed. Her primary gradient is mid cavitary. There is also not severe asymmetric hypertrophy present.  4. Hyperlipidemia. Managed by Dr. Monia Pouch.  For followup I'll see her back in one year  Sherren Mocha 05/23/2013 9:31 AM

## 2013-05-22 NOTE — Patient Instructions (Addendum)
Your physician wants you to follow-up in: 1 year with Dr. Cooper.  You will receive a reminder letter in the mail two months in advance. If you don't receive a letter, please call our office to schedule the follow-up appointment.  Your physician recommends that you continue on your current medications as directed. Please refer to the Current Medication list given to you today.  

## 2013-12-06 ENCOUNTER — Encounter: Payer: Self-pay | Admitting: Cardiovascular Disease

## 2014-03-04 ENCOUNTER — Other Ambulatory Visit: Payer: Self-pay | Admitting: Cardiovascular Disease

## 2014-06-04 ENCOUNTER — Ambulatory Visit (INDEPENDENT_AMBULATORY_CARE_PROVIDER_SITE_OTHER): Payer: Medicare Other | Admitting: Cardiovascular Disease

## 2014-06-04 ENCOUNTER — Encounter: Payer: Self-pay | Admitting: Cardiovascular Disease

## 2014-06-04 VITALS — BP 122/50 | HR 63 | Ht 62.0 in | Wt 169.0 lb

## 2014-06-04 DIAGNOSIS — R0602 Shortness of breath: Secondary | ICD-10-CM

## 2014-06-04 DIAGNOSIS — I10 Essential (primary) hypertension: Secondary | ICD-10-CM

## 2014-06-04 DIAGNOSIS — I2510 Atherosclerotic heart disease of native coronary artery without angina pectoris: Secondary | ICD-10-CM

## 2014-06-04 NOTE — Progress Notes (Signed)
Cardiology Office Note   Date:  06/04/2014   ID:  MARLYS STEGMAIER, DOB 06/27/1940, MRN [id number]  PCP:  No primary care provider on file.  Cardiologist:  Sherren Mocha, MD    Chief Complaint  Patient presents with  . Shortness of Breath     History of Present Illness: [name] SCHREMP is a 74 y.o. female who presents for follow-up of coronary artery disease and hypertrophic cardiomyopathy without LV outflow tract obstruction. The patient is also followed for hypertension.  She continues to have a lot of problems with low back pain. She does admit to dyspnea with exertion - states 'I can't walk far.' No orthopnea, PND, heart palpitations, or edema. No chest pain or pressure.    Past Medical History  Diagnosis Date  . HTN (hypertension)   . Hyperlipidemia   . Hypertrophic cardiomyopathy     Mild hypertrophic CM without outflow tract obstruction -- ETT/Myoview EF 74%. no ischemia (12/2006)  --stress echo 8/10: normal BP response to exercise. no presyncope  no wall motion abnormalties. No significant LVOT obstruction. non-diagnositc ST depression  --holter monitor normal   . CAD (coronary artery disease)     --s/p DES LAD and POBA diagonal 11/10  --RHC cath normal  . COPD (chronic obstructive pulmonary disease)   . Obesity   . Pre-syncope     No past surgical history on file.  Current Outpatient Prescriptions  Medication Sig Dispense Refill  . amLODipine (NORVASC) 5 MG tablet Take 5 mg by mouth daily.    Marland Kitchen aspirin 81 MG EC tablet Take 81 mg by mouth daily.      Marland Kitchen atorvastatin (LIPITOR) 80 MG tablet Take 80 mg by mouth daily.    . Calcium Carbonate-Vitamin D (CALCIUM 600 + D PO) Take 600 mg by mouth daily.    Marland Kitchen denosumab (PROLIA) 60 MG/ML SOLN injection Inject 60 mg into the skin every 6 (six) months. Administer in upper arm, thigh, or abdomen    . losartan (COZAAR) 100 MG tablet Take 100 mg by mouth daily.      . metoprolol tartrate (LOPRESSOR) 25 MG tablet TAKE 1 TABLET  BY MOUTH TWO TIMES A DAY 180 tablet 3   No current facility-administered medications for this visit.    Allergies:   Morphine and Penicillins   Social History:  The patient  reports that she quit smoking about 14 years ago. Her smoking use included Cigarettes. She does not have any smokeless tobacco history on file. She reports that she does not drink alcohol or use illicit drugs.   Family History:  The patient's  family history is not on file.    ROS:  Please see the history of present illness.  Otherwise, review of systems is positive for back pain.  All other systems are reviewed and negative.    PHYSICAL EXAM: VS:  BP 122/50 mmHg  Pulse 63  Ht 5\' 2"  (1.575 m)  Wt 169 lb (76.658 kg)  BMI 30.90 kg/m2 , BMI Body mass index is 30.9 kg/(m^2).   GEN: Well nourished, well developed, in no acute distress HEENT: normal Neck: no JVD, carotid bruits, or masses Cardiac: RRR; grade 2/6 midsystolic murmur at the left lower sternal border, no edema, no diastolic murmur or gallops. Respiratory:  clear to auscultation bilaterally, normal work of breathing GI: soft, nontender, nondistended, + BS MS: no deformity or atrophy Skin: warm and dry, no rash Neuro:  Strength and sensation are intact Psych: euthymic mood, full affect  EKG:  EKG is ordered today. The ekg ordered today shows normal sinus rhythm 63 bpm, ST and T wave abnormality consider inferolateral ischemia, no change compared to 2014 tracing. Since tracing 05/22/2013, right bundle branch block has resolved.  Recent Labs: No results found for requested labs within last 365 days.   Lipid Panel  No results found for: CHOL, TRIG, HDL, CHOLHDL, VLDL, LDLCALC, LDLDIRECT    Wt Readings from Last 3 Encounters:  06/04/14 169 lb (76.658 kg)  05/22/13 183 lb (83.008 kg)  10/10/12 175 lb (79.379 kg)     Other studies Reviewed: 2D Echocardiogram 07/25/2012: Study Conclusions  - Left ventricle: Technically difficult study. EF is  65%. There is a mild intracavitary gradient. No LVOT obstruction. The cavity size was mildly reduced. Wall thickness was increased increased in a pattern of mild to moderate LVH. The estimated ejection fraction was 65%. Wall motion was normal; there were no regional wall motion abnormalities. Doppler parameters are consistent with high ventricular filling pressure. - Aortic valve: Sclerosis without stenosis. - Mitral valve: Calcified annulus. Mild regurgitation. - Left atrium: The atrium was mildly dilated.   ASSESSMENT AND PLAN: 1. Coronary artery disease. Patient has undergone PCI of the LAD. She's doing well with medical therapy and has no symptoms of angina. She was encouraged about her recent efforts with weight loss. Her weight is down about 15 pounds from last year through intentional weight loss.  2. Hypertension. Blood pressure is well controlled with amlodipine, losartan, and metoprolol.  3. Hypertrophic cardiomyopathy without evidence of LV outflow obstruction. Considering her exertional dyspnea, will repeat an echocardiogram. Her study from 2014 was reviewed.  4. Hyperlipidemia. Managed by primary care  Current medicines are reviewed with the patient today.  The patient does not have concerns regarding medicines.  The following changes have been made:  no change  Disposition:   FU with me in 12 months  Signed, Sherren Mocha, MD  06/04/2014 2:14 PM    [facility] [address], [place], [place]  [address] Phone: [phone]; Fax: [phone]

## 2014-06-04 NOTE — Patient Instructions (Signed)
Your physician recommends that you continue on your current medications as directed. Please refer to the Current Medication list given to you today.  Your physician has requested that you have an echocardiogram. Echocardiography is a painless test that uses sound waves to create images of your heart. It provides your doctor with information about the size and shape of your heart and how well your heart's chambers and valves are working. This procedure takes approximately one hour. There are no restrictions for this procedure.  Your physician wants you to follow-up in: 1 year with Dr. Burt Knack.  You will receive a reminder letter in the mail two months in advance. If you don't receive a letter, please call our office to schedule the follow-up appointment.

## 2014-06-10 ENCOUNTER — Ambulatory Visit (HOSPITAL_COMMUNITY): Payer: Medicare Other | Attending: Cardiology

## 2014-06-10 DIAGNOSIS — R0602 Shortness of breath: Secondary | ICD-10-CM | POA: Insufficient documentation

## 2014-06-10 DIAGNOSIS — R42 Dizziness and giddiness: Secondary | ICD-10-CM | POA: Diagnosis not present

## 2014-06-10 DIAGNOSIS — I10 Essential (primary) hypertension: Secondary | ICD-10-CM | POA: Diagnosis not present

## 2014-06-10 DIAGNOSIS — Z87891 Personal history of nicotine dependence: Secondary | ICD-10-CM | POA: Insufficient documentation

## 2014-06-10 NOTE — Progress Notes (Signed)
2D Echo completed. 06/10/2014

## 2014-06-11 ENCOUNTER — Other Ambulatory Visit (HOSPITAL_COMMUNITY): Payer: Self-pay | Admitting: Interventional Radiology

## 2014-06-11 DIAGNOSIS — M549 Dorsalgia, unspecified: Secondary | ICD-10-CM

## 2014-06-11 DIAGNOSIS — IMO0002 Reserved for concepts with insufficient information to code with codable children: Secondary | ICD-10-CM

## 2014-06-17 ENCOUNTER — Telehealth: Payer: Self-pay | Admitting: Cardiovascular Disease

## 2014-06-17 NOTE — Telephone Encounter (Signed)
Follow up      Pt is calling for echo results

## 2014-06-17 NOTE — Telephone Encounter (Signed)
Follow Up   Pt had ECHO done 2/2. Calling to follow up on results. Please call.

## 2014-06-17 NOTE — Telephone Encounter (Signed)
Notes Recorded by Blane Ohara, MD on 06/17/2014 at 6:10 PM Findings c/w HCM, known diagnosis in this patient. Continue with planned f/u in 6 months and no changes in medications. LV septal thickness 17 mm. Will repeat study in one year.  Pt aware of results.

## 2014-06-17 NOTE — Telephone Encounter (Signed)
I spoke the pt and she would like me to call her back on mobile phone after Dr Burt Knack reviews Echo.

## 2014-07-16 ENCOUNTER — Ambulatory Visit (HOSPITAL_COMMUNITY): Payer: Medicare Other

## 2014-09-28 ENCOUNTER — Encounter (HOSPITAL_COMMUNITY): Payer: Self-pay | Admitting: *Deleted

## 2014-09-28 ENCOUNTER — Inpatient Hospital Stay (HOSPITAL_COMMUNITY)
Admission: EM | Admit: 2014-09-28 | Discharge: 2014-10-10 | DRG: 682 | Disposition: A | Discharge status: Home / Self Care | Payer: Medicare Other | Attending: Internal Medicine | Admitting: Internal Medicine

## 2014-09-28 ENCOUNTER — Emergency Department (HOSPITAL_COMMUNITY): Payer: Medicare Other

## 2014-09-28 DIAGNOSIS — E669 Obesity, unspecified: Secondary | ICD-10-CM | POA: Diagnosis present

## 2014-09-28 DIAGNOSIS — N200 Calculus of kidney: Secondary | ICD-10-CM | POA: Diagnosis present

## 2014-09-28 DIAGNOSIS — N179 Acute kidney failure, unspecified: Secondary | ICD-10-CM | POA: Diagnosis not present

## 2014-09-28 DIAGNOSIS — Z880 Allergy status to penicillin: Secondary | ICD-10-CM

## 2014-09-28 DIAGNOSIS — C9000 Multiple myeloma not having achieved remission: Secondary | ICD-10-CM | POA: Diagnosis present

## 2014-09-28 DIAGNOSIS — K2970 Gastritis, unspecified, without bleeding: Secondary | ICD-10-CM | POA: Diagnosis present

## 2014-09-28 DIAGNOSIS — I5030 Unspecified diastolic (congestive) heart failure: Secondary | ICD-10-CM | POA: Diagnosis present

## 2014-09-28 DIAGNOSIS — Z6831 Body mass index (BMI) 31.0-31.9, adult: Secondary | ICD-10-CM

## 2014-09-28 DIAGNOSIS — B9681 Helicobacter pylori [H. pylori] as the cause of diseases classified elsewhere: Secondary | ICD-10-CM | POA: Diagnosis present

## 2014-09-28 DIAGNOSIS — J449 Chronic obstructive pulmonary disease, unspecified: Secondary | ICD-10-CM | POA: Diagnosis present

## 2014-09-28 DIAGNOSIS — M8580 Other specified disorders of bone density and structure, unspecified site: Secondary | ICD-10-CM | POA: Diagnosis present

## 2014-09-28 DIAGNOSIS — Z7982 Long term (current) use of aspirin: Secondary | ICD-10-CM

## 2014-09-28 DIAGNOSIS — Z955 Presence of coronary angioplasty implant and graft: Secondary | ICD-10-CM

## 2014-09-28 DIAGNOSIS — I2510 Atherosclerotic heart disease of native coronary artery without angina pectoris: Secondary | ICD-10-CM | POA: Diagnosis present

## 2014-09-28 DIAGNOSIS — Z87891 Personal history of nicotine dependence: Secondary | ICD-10-CM

## 2014-09-28 DIAGNOSIS — R05 Cough: Secondary | ICD-10-CM

## 2014-09-28 DIAGNOSIS — N183 Chronic kidney disease, stage 3 (moderate): Secondary | ICD-10-CM | POA: Diagnosis present

## 2014-09-28 DIAGNOSIS — D61818 Other pancytopenia: Secondary | ICD-10-CM | POA: Diagnosis present

## 2014-09-28 DIAGNOSIS — D649 Anemia, unspecified: Secondary | ICD-10-CM | POA: Diagnosis present

## 2014-09-28 DIAGNOSIS — R0602 Shortness of breath: Secondary | ICD-10-CM | POA: Diagnosis present

## 2014-09-28 DIAGNOSIS — Z807 Family history of other malignant neoplasms of lymphoid, hematopoietic and related tissues: Secondary | ICD-10-CM

## 2014-09-28 DIAGNOSIS — G9340 Encephalopathy, unspecified: Secondary | ICD-10-CM | POA: Diagnosis not present

## 2014-09-28 DIAGNOSIS — Z79899 Other long term (current) drug therapy: Secondary | ICD-10-CM

## 2014-09-28 DIAGNOSIS — Z8249 Family history of ischemic heart disease and other diseases of the circulatory system: Secondary | ICD-10-CM

## 2014-09-28 DIAGNOSIS — R140 Abdominal distension (gaseous): Secondary | ICD-10-CM

## 2014-09-28 DIAGNOSIS — I129 Hypertensive chronic kidney disease with stage 1 through stage 4 chronic kidney disease, or unspecified chronic kidney disease: Secondary | ICD-10-CM | POA: Diagnosis present

## 2014-09-28 DIAGNOSIS — N19 Unspecified kidney failure: Secondary | ICD-10-CM

## 2014-09-28 DIAGNOSIS — I5032 Chronic diastolic (congestive) heart failure: Secondary | ICD-10-CM

## 2014-09-28 DIAGNOSIS — I081 Rheumatic disorders of both mitral and tricuspid valves: Secondary | ICD-10-CM | POA: Diagnosis present

## 2014-09-28 DIAGNOSIS — J40 Bronchitis, not specified as acute or chronic: Secondary | ICD-10-CM | POA: Diagnosis present

## 2014-09-28 DIAGNOSIS — E785 Hyperlipidemia, unspecified: Secondary | ICD-10-CM | POA: Diagnosis present

## 2014-09-28 DIAGNOSIS — R079 Chest pain, unspecified: Secondary | ICD-10-CM | POA: Diagnosis present

## 2014-09-28 DIAGNOSIS — D6489 Other specified anemias: Secondary | ICD-10-CM

## 2014-09-28 DIAGNOSIS — I5043 Acute on chronic combined systolic (congestive) and diastolic (congestive) heart failure: Secondary | ICD-10-CM | POA: Insufficient documentation

## 2014-09-28 DIAGNOSIS — I10 Essential (primary) hypertension: Secondary | ICD-10-CM | POA: Insufficient documentation

## 2014-09-28 DIAGNOSIS — R0902 Hypoxemia: Secondary | ICD-10-CM

## 2014-09-28 DIAGNOSIS — R059 Cough, unspecified: Secondary | ICD-10-CM

## 2014-09-28 DIAGNOSIS — Z885 Allergy status to narcotic agent status: Secondary | ICD-10-CM

## 2014-09-28 DIAGNOSIS — I421 Obstructive hypertrophic cardiomyopathy: Secondary | ICD-10-CM | POA: Insufficient documentation

## 2014-09-28 DIAGNOSIS — R0789 Other chest pain: Secondary | ICD-10-CM | POA: Diagnosis present

## 2014-09-28 DIAGNOSIS — I4510 Unspecified right bundle-branch block: Secondary | ICD-10-CM | POA: Diagnosis present

## 2014-09-28 HISTORY — DX: Heart failure, unspecified: I50.9

## 2014-09-28 LAB — BASIC METABOLIC PANEL
ANION GAP: 9 (ref 5–15)
BUN: 45 mg/dL — AB (ref 6–20)
CO2: 24 mmol/L (ref 22–32)
Calcium: 8.7 mg/dL — ABNORMAL LOW (ref 8.9–10.3)
Chloride: 106 mmol/L (ref 101–111)
Creatinine, Ser: 4.1 mg/dL — ABNORMAL HIGH (ref 0.44–1.00)
GFR calc Af Amer: 11 mL/min — ABNORMAL LOW (ref >60)
GFR calc non Af Amer: 10 mL/min — ABNORMAL LOW (ref >60)
GLUCOSE: 153 mg/dL — AB (ref 65–99)
POTASSIUM: 4.1 mmol/L (ref 3.5–5.1)
SODIUM: 139 mmol/L (ref 135–145)

## 2014-09-28 LAB — CBC
HCT: 25.1 % — ABNORMAL LOW (ref 36.0–46.0)
HEMOGLOBIN: 8.2 g/dL — AB (ref 12.0–15.0)
MCH: 32.4 pg (ref 26.0–34.0)
MCHC: 32.7 g/dL (ref 30.0–36.0)
MCV: 99.2 fL (ref 78.0–100.0)
PLATELETS: 178 10*3/uL (ref 150–400)
RBC: 2.53 MIL/uL — ABNORMAL LOW (ref 3.87–5.11)
RDW: 14.4 % (ref 11.5–15.5)
WBC: 3.6 10*3/uL — ABNORMAL LOW (ref 4.0–10.5)

## 2014-09-28 LAB — POC OCCULT BLOOD, ED: Fecal Occult Bld: NEGATIVE

## 2014-09-28 LAB — I-STAT TROPONIN, ED: Troponin i, poc: 0.02 ng/mL (ref 0.00–0.08)

## 2014-09-28 LAB — BRAIN NATRIURETIC PEPTIDE: B Natriuretic Peptide: 597.6 pg/mL — ABNORMAL HIGH (ref 0.0–100.0)

## 2014-09-28 MED ORDER — NITROGLYCERIN 0.4 MG SL SUBL: 0.4000 mg | SUBLINGUAL_TABLET | SUBLINGUAL | Status: DC | PRN

## 2014-09-28 MED ORDER — ASPIRIN 81 MG PO CHEW
324.0000 mg | CHEWABLE_TABLET | Freq: Once | ORAL | Status: AC
  Administered 2014-09-28: 324 mg via ORAL
  Filled 2014-09-28: qty 4

## 2014-09-28 MED ORDER — SODIUM CHLORIDE 0.9 % IV SOLN
Freq: Once | INTRAVENOUS | Status: AC
  Administered 2014-09-29: 02:00:00 via INTRAVENOUS

## 2014-09-28 NOTE — ED Notes (Signed)
Pt alert

## 2014-09-28 NOTE — ED Notes (Signed)
Pt in c/o shortness of breath and chest tightness, recently admitted to [facility] for CHF, states symptoms never resolved, but started getting bad again tonight, pt speaking in short phrases, coughing during triage

## 2014-09-28 NOTE — ED Notes (Signed)
The pt feels like something is sitting on her chest.  She denies pain .  She has a cough.  She was admitted at [facility] in [place] 2 days ago with chf.  She was given iv lasix.

## 2014-09-28 NOTE — ED Provider Notes (Signed)
CSN: [id number]     Arrival date & time 09/28/14  2139 History   First MD Initiated Contact with Patient 09/28/14 2157     Chief Complaint  Patient presents with  . Shortness of Breath     (Consider location/radiation/quality/duration/timing/severity/associated sxs/prior Treatment) HPI Comments: From home with 1 week history of SOB, cough and dyspnea on exertion.  Recently admitted to [facility] hospital with CHF and diuresed.  Discharged on 5/19. She has been aggressively diuresed during her hospital stay.  EF was 65%, she normally does not take diuretics.  She feels her breathing never really improved and is no better. Continue to have dry cough, difficulty lying flat, DOE.  Today has had intermittent "Chest pressure" last minutes to hours at a time.  Pain is central and does not radiate. No diaphoresis, nausea, vomiting, syncope.  Hx stent placement 2010. Hx HCOM with no significant LV obstruction. Former smoker, no diagnosis of COPD or Asthma.  The history is provided by the patient and a relative. The history is limited by the condition of the patient.    Past Medical History  Diagnosis Date  . HTN (hypertension)   . Hyperlipidemia   . Hypertrophic cardiomyopathy     Mild hypertrophic CM without outflow tract obstruction -- ETT/Myoview EF 74%. no ischemia (12/2006)  --stress echo 8/10: normal BP response to exercise. no presyncope  no wall motion abnormalties. No significant LVOT obstruction. non-diagnositc ST depression  --holter monitor normal   . CAD (coronary artery disease)     --s/p DES LAD and POBA diagonal 11/10  --RHC cath normal  . COPD (chronic obstructive pulmonary disease)   . Obesity   . Pre-syncope   . CHF (congestive heart failure)    History reviewed. No pertinent past surgical history. Family History  Problem Relation Age of Onset  . CAD Mother   . Multiple myeloma Father   . Diabetes type II Sister   . CAD Brother   . Stroke Sister   . Uterine cancer Sister     History  Substance Use Topics  . Smoking status: Former Smoker    Types: Cigarettes    Quit date: 05/09/2000  . Smokeless tobacco: Not on file  . Alcohol Use: No   OB History    No data available     Review of Systems  Constitutional: Positive for activity change, appetite change and fatigue.  HENT: Positive for congestion.   Respiratory: Positive for cough, chest tightness, shortness of breath and wheezing.   Cardiovascular: Positive for chest pain.  Gastrointestinal: Negative for nausea, vomiting and abdominal pain.  Genitourinary: Negative for dysuria, vaginal bleeding and vaginal discharge.  Musculoskeletal: Positive for myalgias and arthralgias.  Neurological: Positive for weakness.      Allergies  Morphine and Penicillins  Home Medications   Prior to Admission medications   Medication Sig Start Date End Date Taking? Authorizing Provider  amLODipine (NORVASC) 5 MG tablet Take 5 mg by mouth daily.   Yes Historical Provider, MD  aspirin 81 MG EC tablet Take 81 mg by mouth daily.     Yes Historical Provider, MD  atorvastatin (LIPITOR) 80 MG tablet Take 80 mg by mouth at bedtime.    Yes Historical Provider, MD  azithromycin (ZITHROMAX) 250 MG tablet Take 250 mg by mouth daily. z-pak-Started 09/26/14 x5 days 09/26/14 10/01/14 Yes Historical Provider, MD  Calcium Carbonate-Vitamin D (CALCIUM 600 + D PO) Take 600 mg by mouth daily.   Yes Historical Provider, MD  denosumab (  PROLIA) 60 MG/ML SOLN injection Inject 60 mg into the skin every 6 (six) months. Administer in upper arm, thigh, or abdomen   Yes Historical Provider, MD  furosemide (LASIX) 40 MG tablet Take 40 mg by mouth daily.   Yes Historical Provider, MD  latanoprost (XALATAN) 0.005 % ophthalmic solution Place 1 drop into both eyes at bedtime.   Yes Historical Provider, MD  losartan (COZAAR) 100 MG tablet Take 100 mg by mouth daily.     Yes Historical Provider, MD  metoprolol tartrate (LOPRESSOR) 25 MG tablet TAKE 1  TABLET BY MOUTH TWO TIMES A DAY 03/06/14  Yes Sherren Mocha, MD   BP 105/38 mmHg  Pulse 81  Temp(Src) 98.5 F (36.9 C) (Oral)  Resp 17  Wt 169 lb 12.8 oz (77.021 kg)  SpO2 97% Physical Exam  Constitutional: She is oriented to person, place, and time. She appears well-developed and well-nourished. No distress.  Anxious, mildly dyspneic with conversation.  HENT:  Head: Normocephalic and atraumatic.  Mouth/Throat: Oropharynx is clear and moist. No oropharyngeal exudate.  Eyes: Conjunctivae and EOM are normal. Pupils are equal, round, and reactive to light.  Neck: Normal range of motion. Neck supple.  No meningismus.  Cardiovascular: Normal rate, regular rhythm, normal heart sounds and intact distal pulses.   No murmur heard. Pulmonary/Chest: Effort normal. No respiratory distress. She has wheezes.  Faint expiratory wheezing Increased WOB, frequent coughingg  Abdominal: Soft. There is no tenderness. There is no rebound and no guarding.  Musculoskeletal: Normal range of motion. She exhibits no edema or tenderness.  Neurological: She is alert and oriented to person, place, and time. No cranial nerve deficit. She exhibits normal muscle tone. Coordination normal.  No ataxia on finger to nose bilaterally. No pronator drift. 5/5 strength throughout. CN 2-12 intact. Negative Romberg. Equal grip strength. Sensation intact. Gait is normal.   Skin: Skin is warm.  Psychiatric: She has a normal mood and affect. Her behavior is normal.  Nursing note and vitals reviewed.   ED Course  Procedures (including critical care time) Labs Review Labs Reviewed  CBC - Abnormal; Notable for the following:    WBC 3.6 (*)    RBC 2.53 (*)    Hemoglobin 8.2 (*)    HCT 25.1 (*)    All other components within normal limits  BASIC METABOLIC PANEL - Abnormal; Notable for the following:    Glucose, Bld 153 (*)    BUN 45 (*)    Creatinine, Ser 4.10 (*)    Calcium 8.7 (*)    GFR calc non Af Amer 10 (*)     GFR calc Af Amer 11 (*)    All other components within normal limits  BRAIN NATRIURETIC PEPTIDE - Abnormal; Notable for the following:    B Natriuretic Peptide 597.6 (*)    All other components within normal limits  RETICULOCYTES - Abnormal; Notable for the following:    RBC. 2.42 (*)    All other components within normal limits  URINALYSIS, ROUTINE W REFLEX MICROSCOPIC - Abnormal; Notable for the following:    Hgb urine dipstick SMALL (*)    Protein, ur 30 (*)    All other components within normal limits  PHOSPHORUS - Abnormal; Notable for the following:    Phosphorus 5.4 (*)    All other components within normal limits  COMPREHENSIVE METABOLIC PANEL - Abnormal; Notable for the following:    Glucose, Bld 110 (*)    BUN 46 (*)    Creatinine, Ser  3.94 (*)    Calcium 8.6 (*)    Total Protein 6.0 (*)    Albumin 3.3 (*)    GFR calc non Af Amer 10 (*)    GFR calc Af Amer 12 (*)    All other components within normal limits  CBC - Abnormal; Notable for the following:    WBC 3.2 (*)    RBC 2.47 (*)    Hemoglobin 8.1 (*)    HCT 24.5 (*)    All other components within normal limits  BASIC METABOLIC PANEL - Abnormal; Notable for the following:    CO2 21 (*)    Glucose, Bld 142 (*)    BUN 45 (*)    Creatinine, Ser 3.82 (*)    Calcium 8.3 (*)    GFR calc non Af Amer 11 (*)    GFR calc Af Amer 12 (*)    All other components within normal limits  TROPONIN I  VITAMIN B12  FOLATE  IRON AND TIBC  FERRITIN  INFLUENZA PANEL BY PCR (TYPE A & B, H1N1)  TROPONIN I  MAGNESIUM  TSH  CREATININE, URINE, RANDOM  SODIUM, URINE, RANDOM  TROPONIN I  URINE MICROSCOPIC-ADD ON  TROPONIN I  HEMOGLOBIN A1C  TROPONIN I  I-STAT TROPOININ, ED  POC OCCULT BLOOD, ED  TYPE AND SCREEN  ABO/RH    Imaging Review Ct Chest Wo Contrast  09/29/2014   CLINICAL DATA:  Shortness of breath and nonproductive cough  EXAM: CT CHEST WITHOUT CONTRAST  TECHNIQUE: Multidetector CT imaging of the chest was  performed following the standard protocol without IV contrast.  COMPARISON:  Chest radiograph 09/28/2014, thoracic MRI 05/28/2014  FINDINGS: Mediastinum/Nodes: Moderate atheromatous aortic and coronary arterial calcification without aneurysm. Moderate cardiac enlargement is noted. No pericardial effusion. No lymphadenopathy. Probable vascular at ectasia at the level of the right brachiocephalic artery/subclavian takeoff image 4.  Lungs/Pleura: Areas of patchy ground-glass airspace opacity are identified with interlobular septal thickening and central bronchial wall thickening. Central airways are patent. Trace pleural fluid.  Upper abdomen: The upper abdominal viscera unremarkable where visualized.  Musculoskeletal: T10 vertebra plana reidentified. No new acute osseous abnormality. The bones are subjectively osteopenic.  IMPRESSION: Patchy ground-glass airspace opacity with interlobular septal thickening and bronchial wall thickening most compatible with early pulmonary edema.  Moderate cardiomegaly.   Electronically Signed   By: Conchita Paris M.D.   On: 09/29/2014 10:42   US Renal  09/29/2014   CLINICAL DATA:  Acute renal failure.  EXAM: RENAL / URINARY TRACT ULTRASOUND COMPLETE  COMPARISON:  None.  FINDINGS: Right Kidney:  Length: 11.8 cm. Echogenicity within normal limits. No hydronephrosis visualized. 8 mm cyst right upper renal pole. Right nephrolithiasis noted.  Left Kidney:  Length: 12.1 cm. Echogenicity within normal limits. No hydronephrosis visualized. 8 mm cyst left lower renal pole.  Bladder:  Appears normal for degree of bladder distention.  IMPRESSION: Bilateral simple renal cysts. Right nephrolithiasis. Exam is otherwise normal. No evidence of hydronephrosis or bladder distention.   Electronically Signed   By: Bogue   On: 09/29/2014 10:14   Dg Chest Port 1 View  09/28/2014   CLINICAL DATA:  Shortness of breath and chest pressure with dry cough for the past 3 weeks. Recent diagnosis  of congestive heart failure.  EXAM: PORTABLE CHEST - 1 VIEW  COMPARISON:  09/23/2014; 09/22/2012  FINDINGS: Grossly unchanged enlarged cardiac silhouette and mediastinal contours with partially calcified right suprahilar lymph nodes. Grossly unchanged bibasilar heterogeneous opacities, left greater  than right, likely atelectasis or scar. No new focal airspace opacities. No pleural effusion or pneumothorax. No evidence of edema. No acute osseus abnormalities.  IMPRESSION: 1.  No acute cardiopulmonary disease. 2. Grossly unchanged bibasilar atelectasis/scar, left greater than right.   Electronically Signed   By: Sandi Mariscal M.D.   On: 09/28/2014 22:15     EKG Interpretation   Date/Time:  Sunday Sep 28 2014 21:43:54 EDT Ventricular Rate:  94 PR Interval:  178 QRS Duration: 120 QT Interval:  416 QTC Calculation: 520 R Axis:   81 Text Interpretation:  Normal sinus rhythm Right bundle branch block  Abnormal ECG more pronounced ST depression laterally Confirmed by Wyvonnia Dusky   MD, Dawana Asper ([id number]) on 09/28/2014 9:55:55 PM      MDM   Final diagnoses:  Acute renal failure, unspecified acute renal failure type  Cough, SOB, DOE, recent admit for CHF.  EKG with RBBB, more pronounced ST depression laterally. Slight wheezing on exam.  CXR clear.  Appears somewhat dry on exam.  Question component of bronchitis. Labs show Cr 4, anemia of 8.  Denies blood in stool. FOBT negative.  Records obtained from [facility]. Patient discharged on 09/25/2014 for diagnosis of acute diastolic heart failure ejection fraction 65%. Patient also with anemia and H. pylori gastritis. Hemoglobin was 8.7 at discharge. Patient was treated with IV Lasix, potassium, Xopenex during her hospitalization. Creatinine was 1.1.  New renal failure likely from overdiuresis.  Will give gentle IVF.  Anemia new since 2010 but stable for [facility] records. Troponin negative.  Will need admission for gentle hydration and further cardiac  evaluation.  Ezequiel Essex, MD 09/29/14 [phone]

## 2014-09-29 ENCOUNTER — Inpatient Hospital Stay (HOSPITAL_COMMUNITY): Payer: Medicare Other

## 2014-09-29 ENCOUNTER — Encounter (HOSPITAL_COMMUNITY): Payer: Self-pay | Admitting: Internal Medicine

## 2014-09-29 DIAGNOSIS — D649 Anemia, unspecified: Secondary | ICD-10-CM | POA: Diagnosis present

## 2014-09-29 DIAGNOSIS — R0602 Shortness of breath: Secondary | ICD-10-CM

## 2014-09-29 DIAGNOSIS — I5032 Chronic diastolic (congestive) heart failure: Secondary | ICD-10-CM

## 2014-09-29 DIAGNOSIS — Z79899 Other long term (current) drug therapy: Secondary | ICD-10-CM | POA: Diagnosis not present

## 2014-09-29 DIAGNOSIS — I129 Hypertensive chronic kidney disease with stage 1 through stage 4 chronic kidney disease, or unspecified chronic kidney disease: Secondary | ICD-10-CM | POA: Diagnosis present

## 2014-09-29 DIAGNOSIS — J40 Bronchitis, not specified as acute or chronic: Secondary | ICD-10-CM

## 2014-09-29 DIAGNOSIS — D6489 Other specified anemias: Secondary | ICD-10-CM

## 2014-09-29 DIAGNOSIS — R079 Chest pain, unspecified: Secondary | ICD-10-CM | POA: Diagnosis not present

## 2014-09-29 DIAGNOSIS — N200 Calculus of kidney: Secondary | ICD-10-CM | POA: Diagnosis present

## 2014-09-29 DIAGNOSIS — M8580 Other specified disorders of bone density and structure, unspecified site: Secondary | ICD-10-CM | POA: Diagnosis present

## 2014-09-29 DIAGNOSIS — R05 Cough: Secondary | ICD-10-CM | POA: Diagnosis not present

## 2014-09-29 DIAGNOSIS — R0600 Dyspnea, unspecified: Secondary | ICD-10-CM

## 2014-09-29 DIAGNOSIS — R072 Precordial pain: Secondary | ICD-10-CM | POA: Diagnosis not present

## 2014-09-29 DIAGNOSIS — E669 Obesity, unspecified: Secondary | ICD-10-CM | POA: Diagnosis present

## 2014-09-29 DIAGNOSIS — D61818 Other pancytopenia: Secondary | ICD-10-CM | POA: Diagnosis present

## 2014-09-29 DIAGNOSIS — Z885 Allergy status to narcotic agent status: Secondary | ICD-10-CM | POA: Diagnosis not present

## 2014-09-29 DIAGNOSIS — G9340 Encephalopathy, unspecified: Secondary | ICD-10-CM | POA: Diagnosis not present

## 2014-09-29 DIAGNOSIS — I5030 Unspecified diastolic (congestive) heart failure: Secondary | ICD-10-CM | POA: Diagnosis present

## 2014-09-29 DIAGNOSIS — N179 Acute kidney failure, unspecified: Secondary | ICD-10-CM | POA: Insufficient documentation

## 2014-09-29 DIAGNOSIS — K2970 Gastritis, unspecified, without bleeding: Secondary | ICD-10-CM | POA: Diagnosis present

## 2014-09-29 DIAGNOSIS — Z807 Family history of other malignant neoplasms of lymphoid, hematopoietic and related tissues: Secondary | ICD-10-CM | POA: Diagnosis not present

## 2014-09-29 DIAGNOSIS — I081 Rheumatic disorders of both mitral and tricuspid valves: Secondary | ICD-10-CM | POA: Diagnosis present

## 2014-09-29 DIAGNOSIS — Z6831 Body mass index (BMI) 31.0-31.9, adult: Secondary | ICD-10-CM | POA: Diagnosis not present

## 2014-09-29 DIAGNOSIS — N183 Chronic kidney disease, stage 3 (moderate): Secondary | ICD-10-CM | POA: Diagnosis present

## 2014-09-29 DIAGNOSIS — I10 Essential (primary) hypertension: Secondary | ICD-10-CM | POA: Diagnosis not present

## 2014-09-29 DIAGNOSIS — R0789 Other chest pain: Secondary | ICD-10-CM | POA: Diagnosis present

## 2014-09-29 DIAGNOSIS — Z8249 Family history of ischemic heart disease and other diseases of the circulatory system: Secondary | ICD-10-CM | POA: Diagnosis not present

## 2014-09-29 DIAGNOSIS — Z955 Presence of coronary angioplasty implant and graft: Secondary | ICD-10-CM | POA: Diagnosis not present

## 2014-09-29 DIAGNOSIS — E785 Hyperlipidemia, unspecified: Secondary | ICD-10-CM | POA: Diagnosis present

## 2014-09-29 DIAGNOSIS — C9000 Multiple myeloma not having achieved remission: Secondary | ICD-10-CM | POA: Diagnosis present

## 2014-09-29 DIAGNOSIS — D538 Other specified nutritional anemias: Secondary | ICD-10-CM | POA: Diagnosis not present

## 2014-09-29 DIAGNOSIS — Z87891 Personal history of nicotine dependence: Secondary | ICD-10-CM | POA: Diagnosis not present

## 2014-09-29 DIAGNOSIS — Z7982 Long term (current) use of aspirin: Secondary | ICD-10-CM | POA: Diagnosis not present

## 2014-09-29 DIAGNOSIS — Z880 Allergy status to penicillin: Secondary | ICD-10-CM | POA: Diagnosis not present

## 2014-09-29 DIAGNOSIS — I4510 Unspecified right bundle-branch block: Secondary | ICD-10-CM | POA: Diagnosis present

## 2014-09-29 DIAGNOSIS — I2510 Atherosclerotic heart disease of native coronary artery without angina pectoris: Secondary | ICD-10-CM | POA: Diagnosis present

## 2014-09-29 DIAGNOSIS — J449 Chronic obstructive pulmonary disease, unspecified: Secondary | ICD-10-CM | POA: Diagnosis present

## 2014-09-29 DIAGNOSIS — I5043 Acute on chronic combined systolic (congestive) and diastolic (congestive) heart failure: Secondary | ICD-10-CM | POA: Diagnosis not present

## 2014-09-29 DIAGNOSIS — I421 Obstructive hypertrophic cardiomyopathy: Secondary | ICD-10-CM | POA: Diagnosis present

## 2014-09-29 DIAGNOSIS — B9681 Helicobacter pylori [H. pylori] as the cause of diseases classified elsewhere: Secondary | ICD-10-CM | POA: Diagnosis present

## 2014-09-29 LAB — COMPREHENSIVE METABOLIC PANEL
ALT: 23 U/L (ref 14–54)
AST: 20 U/L (ref 15–41)
Albumin: 3.3 g/dL — ABNORMAL LOW (ref 3.5–5.0)
Alkaline Phosphatase: 76 U/L (ref 38–126)
Anion gap: 11 (ref 5–15)
BUN: 46 mg/dL — ABNORMAL HIGH (ref 6–20)
CO2: 22 mmol/L (ref 22–32)
Calcium: 8.6 mg/dL — ABNORMAL LOW (ref 8.9–10.3)
Chloride: 109 mmol/L (ref 101–111)
Creatinine, Ser: 3.94 mg/dL — ABNORMAL HIGH (ref 0.44–1.00)
GFR calc Af Amer: 12 mL/min — ABNORMAL LOW (ref >60)
GFR calc non Af Amer: 10 mL/min — ABNORMAL LOW (ref >60)
Glucose, Bld: 110 mg/dL — ABNORMAL HIGH (ref 65–99)
POTASSIUM: 4.5 mmol/L (ref 3.5–5.1)
Sodium: 142 mmol/L (ref 135–145)
TOTAL PROTEIN: 6 g/dL — AB (ref 6.5–8.1)
Total Bilirubin: 0.8 mg/dL (ref 0.3–1.2)

## 2014-09-29 LAB — URINALYSIS, ROUTINE W REFLEX MICROSCOPIC
BILIRUBIN URINE: NEGATIVE
GLUCOSE, UA: NEGATIVE mg/dL
Ketones, ur: NEGATIVE mg/dL
LEUKOCYTES UA: NEGATIVE
Nitrite: NEGATIVE
PROTEIN: 30 mg/dL — AB
Specific Gravity, Urine: 1.019 (ref 1.005–1.030)
UROBILINOGEN UA: 0.2 mg/dL (ref 0.0–1.0)
pH: 5.5 (ref 5.0–8.0)

## 2014-09-29 LAB — VITAMIN B12: Vitamin B-12: 576 pg/mL (ref 180–914)

## 2014-09-29 LAB — TROPONIN I
TROPONIN I: 0.03 ng/mL (ref <0.031)
Troponin I: 0.03 ng/mL (ref <0.031)
Troponin I: 0.03 ng/mL (ref <0.031)
Troponin I: 0.03 ng/mL (ref <0.031)

## 2014-09-29 LAB — BASIC METABOLIC PANEL
Anion gap: 11 (ref 5–15)
BUN: 45 mg/dL — ABNORMAL HIGH (ref 6–20)
CO2: 21 mmol/L — AB (ref 22–32)
Calcium: 8.3 mg/dL — ABNORMAL LOW (ref 8.9–10.3)
Chloride: 109 mmol/L (ref 101–111)
Creatinine, Ser: 3.82 mg/dL — ABNORMAL HIGH (ref 0.44–1.00)
GFR calc non Af Amer: 11 mL/min — ABNORMAL LOW (ref >60)
GFR, EST AFRICAN AMERICAN: 12 mL/min — AB (ref >60)
Glucose, Bld: 142 mg/dL — ABNORMAL HIGH (ref 65–99)
POTASSIUM: 4.2 mmol/L (ref 3.5–5.1)
SODIUM: 141 mmol/L (ref 135–145)

## 2014-09-29 LAB — SODIUM, URINE, RANDOM: Sodium, Ur: 56 mmol/L

## 2014-09-29 LAB — URINE MICROSCOPIC-ADD ON

## 2014-09-29 LAB — RETICULOCYTES
RBC.: 2.42 MIL/uL — AB (ref 3.87–5.11)
RETIC COUNT ABSOLUTE: 24.2 10*3/uL (ref 19.0–186.0)
Retic Ct Pct: 1 % (ref 0.4–3.1)

## 2014-09-29 LAB — TSH: TSH: 3.099 u[IU]/mL (ref 0.350–4.500)

## 2014-09-29 LAB — IRON AND TIBC
IRON: 59 ug/dL (ref 28–170)
Saturation Ratios: 19 % (ref 10.4–31.8)
TIBC: 312 ug/dL (ref 250–450)
UIBC: 253 ug/dL

## 2014-09-29 LAB — CREATININE, URINE, RANDOM: CREATININE, URINE: 118.2 mg/dL

## 2014-09-29 LAB — FOLATE: FOLATE: 16.3 ng/mL (ref >5.9)

## 2014-09-29 LAB — CBC
HCT: 24.5 % — ABNORMAL LOW (ref 36.0–46.0)
HEMOGLOBIN: 8.1 g/dL — AB (ref 12.0–15.0)
MCH: 32.8 pg (ref 26.0–34.0)
MCHC: 33.1 g/dL (ref 30.0–36.0)
MCV: 99.2 fL (ref 78.0–100.0)
Platelets: 169 10*3/uL (ref 150–400)
RBC: 2.47 MIL/uL — AB (ref 3.87–5.11)
RDW: 14.5 % (ref 11.5–15.5)
WBC: 3.2 10*3/uL — AB (ref 4.0–10.5)

## 2014-09-29 LAB — INFLUENZA PANEL BY PCR (TYPE A & B)
H1N1FLUPCR: NOT DETECTED
INFLAPCR: NEGATIVE
Influenza B By PCR: NEGATIVE

## 2014-09-29 LAB — PHOSPHORUS: Phosphorus: 5.4 mg/dL — ABNORMAL HIGH (ref 2.5–4.6)

## 2014-09-29 LAB — ABO/RH: ABO/RH(D): O POS

## 2014-09-29 LAB — FERRITIN: Ferritin: 154 ng/mL (ref 11–307)

## 2014-09-29 LAB — MAGNESIUM: MAGNESIUM: 2.2 mg/dL (ref 1.7–2.4)

## 2014-09-29 MED ORDER — ACETAMINOPHEN 650 MG RE SUPP: 650.0000 mg | Freq: Four times a day (QID) | RECTAL | Status: DC | PRN

## 2014-09-29 MED ORDER — LATANOPROST 0.005 % OP SOLN
1.0000 [drp] | Freq: Every day | OPHTHALMIC | Status: DC
  Administered 2014-09-29 – 2014-10-09 (×11): 1 [drp] via OPHTHALMIC
  Filled 2014-09-29: qty 2.5

## 2014-09-29 MED ORDER — DOXYCYCLINE HYCLATE 100 MG PO TABS
100.0000 mg | ORAL_TABLET | Freq: Two times a day (BID) | ORAL | Status: DC
  Administered 2014-09-29 – 2014-09-30 (×4): 100 mg via ORAL
  Filled 2014-09-29 (×5): qty 1

## 2014-09-29 MED ORDER — ALBUTEROL SULFATE (2.5 MG/3ML) 0.083% IN NEBU
2.5000 mg | INHALATION_SOLUTION | RESPIRATORY_TRACT | Status: DC | PRN
  Administered 2014-10-07 – 2014-10-08 (×3): 2.5 mg via RESPIRATORY_TRACT
  Filled 2014-09-29 (×3): qty 3

## 2014-09-29 MED ORDER — SODIUM CHLORIDE 0.9 % IJ SOLN
3.0000 mL | Freq: Two times a day (BID) | INTRAMUSCULAR | Status: DC
  Administered 2014-09-29 – 2014-10-09 (×21): 3 mL via INTRAVENOUS

## 2014-09-29 MED ORDER — ATORVASTATIN CALCIUM 80 MG PO TABS
80.0000 mg | ORAL_TABLET | Freq: Every day | ORAL | Status: DC
  Administered 2014-09-29 – 2014-10-09 (×12): 80 mg via ORAL
  Filled 2014-09-29 (×13): qty 1

## 2014-09-29 MED ORDER — HEPARIN SODIUM (PORCINE) 5000 UNIT/ML IJ SOLN
5000.0000 [IU] | Freq: Three times a day (TID) | INTRAMUSCULAR | Status: DC
  Administered 2014-09-29 – 2014-10-04 (×16): 5000 [IU] via SUBCUTANEOUS
  Filled 2014-09-29 (×18): qty 1

## 2014-09-29 MED ORDER — SODIUM CHLORIDE 0.9 % IV SOLN
INTRAVENOUS | Status: DC
  Administered 2014-09-29: 04:00:00 via INTRAVENOUS

## 2014-09-29 MED ORDER — ASPIRIN EC 81 MG PO TBEC
81.0000 mg | DELAYED_RELEASE_TABLET | Freq: Every day | ORAL | Status: DC
  Administered 2014-09-29 – 2014-10-03 (×5): 81 mg via ORAL
  Filled 2014-09-29 (×6): qty 1

## 2014-09-29 MED ORDER — ACETAMINOPHEN 325 MG PO TABS: 650.0000 mg | ORAL_TABLET | Freq: Four times a day (QID) | ORAL | Status: DC | PRN

## 2014-09-29 MED ORDER — GUAIFENESIN ER 600 MG PO TB12
600.0000 mg | ORAL_TABLET | Freq: Two times a day (BID) | ORAL | Status: DC
  Administered 2014-09-29 – 2014-10-03 (×10): 600 mg via ORAL
  Filled 2014-09-29 (×11): qty 1

## 2014-09-29 MED ORDER — GUAIFENESIN-DM 100-10 MG/5ML PO SYRP
5.0000 mL | ORAL_SOLUTION | ORAL | Status: DC | PRN
  Administered 2014-09-29 – 2014-10-06 (×3): 5 mL via ORAL
  Filled 2014-09-29 (×4): qty 5

## 2014-09-29 MED ORDER — PANTOPRAZOLE SODIUM 40 MG PO TBEC
40.0000 mg | DELAYED_RELEASE_TABLET | Freq: Every day | ORAL | Status: DC
  Administered 2014-09-29 – 2014-10-04 (×6): 40 mg via ORAL
  Filled 2014-09-29 (×6): qty 1

## 2014-09-29 MED ORDER — ALBUTEROL SULFATE (2.5 MG/3ML) 0.083% IN NEBU: 2.5000 mg | INHALATION_SOLUTION | RESPIRATORY_TRACT | Status: AC | PRN

## 2014-09-29 MED ORDER — METOPROLOL TARTRATE 25 MG PO TABS
25.0000 mg | ORAL_TABLET | Freq: Two times a day (BID) | ORAL | Status: DC
  Administered 2014-09-29 – 2014-10-02 (×7): 25 mg via ORAL
  Filled 2014-09-29 (×9): qty 1

## 2014-09-29 NOTE — Progress Notes (Signed)
  Echocardiogram 2D Echocardiogram has been performed.  [name] [name] [name] 09/29/2014, 3:40 PM

## 2014-09-29 NOTE — Progress Notes (Signed)
Pt seen and examined at bedside, please see earlier admission not by Dr. Roel Cluck. Pt admitted for evaluation of ARF in the setting of diuresis with lasix. Renal function slowly improving with IVF. IVF will be stopped as CT chest with early pulmonary edema and pt with mild crackles on exam. 2 D ECHO pending. [facility] team following. Repeat BMP in AM. Monitor daily weights, strict I/O.  Faye Ramsay, MD  [facility] Pager [phone]  If 7PM-7AM, please contact night-coverage [email or web address] Password [email or web address]

## 2014-09-29 NOTE — Progress Notes (Signed)
Admission note:  Arrival Method: Pt arrived on stretcher from ED Mental Orientation: Alert and oriented x 4 Telemetry: Telemetry box 6E09 applied. CCMD notified. Pt running NS Assessment: Completed, see doc flowsheets Skin: Dry and intact IV: Right AC IV. Clean, dry and intact with NS @50ml /hr Pain: Patient states no pain at this time Tubes: N/A Safety Measures: Non-slip socks placed, bed in lowest position, call light within reach Fall Prevention Safety Plan: Reviewed with patient Admission Screening: Completed 6700 Orientation: Patient has been oriented to the unit, staff and to the room. Patient lying comfortably in bed with on needs stated at this time. Family present at bedside. Call light within reach, will continue to monitor.   Shelbie Hutching, RN, BSN

## 2014-09-29 NOTE — Consult Note (Signed)
Referring Physician: Garwin Brothers Primary Physician: Neale Burly, MD Primary Cardiologist: Burt Knack and [place] Reason for Consultation: Renal failure, h/o diastolic HF   HPI:  [name] [name] is a 74 y.o. female with history of obesity, HTN, Diastolic HF, Hypertrophic CM, CAD, and COPD,  who presented to [facility] for coughing and intermittent chest tightness. She had a recent admission for CHF to [facility] from May 17-19.  She states she was diuresed extensively during her stay, but left with a Creatnine of 1.11 and BNP 2400.  Echo showed EF of 65% with LV septum hypertrophy.    She has chronic dyspnea, CP and syncope. She had an extensive work-up in the past including a R/L HC in 2010 with showed a 90% LAD lesion which was stented and normal RH pressures. She also had a CPX test which showed a question of exercise-induced bronchospasm but she did not respond to inhalers. Echo showed normal LV systolic function with diastolic dysfunction. She has had a thick septum with chordal SAM but has not had significant LVOT gradients.   [name] [name] states she reported to the ER as she felt her cough and CP never improved, and in fact got worse after her d/c from [place]. She was found to have a new onset of ARF with Cr of 4.1.  CXR showed no infiltrate. Initial troponin negative. She was also started on protonix and a zpack for H. Pylori gastritis.  Negative hemoccult.  Denies any recent fever, chills, productive cough, or peripheral edema. No CP currently. Denies SOB currently, but does endorse mild DOE.  She is being hydrated now. Renal function improving some. Continues to c/o about fatigue and weakness. Family very worried about her cough.   She had renal u/s which was normal.  Non contrast CT today (after hydration): Patchy ground-glass airspace opacity with interlobular septal thickening and bronchial wall thickening most compatible with early pulmonary edema.   Review of Systems:    General: Denies weight changes, loss of appetite, fever, chills, or night sweats. Cardiac: Denies rthopnea, peripheral edema, palpitations, syncope, or PND. Pulmonary: Denies wheezing, hemoptysis, or snoring GI: Denies N/V/D, blood in stool, ABD pain, or bowel changes GU: Denies blood in urine, incontinence, or dysuria. Vascular: Denies symptoms of claudication or poorly healing wounds. Neuro: Denies HA, dizziness, visual changes, or seizures. All other systems reviewed and are otherwise negative except as noted above.   Past Medical History  Diagnosis Date  . HTN (hypertension)   . Hyperlipidemia   . Hypertrophic cardiomyopathy     Mild hypertrophic CM without outflow tract obstruction -- ETT/Myoview EF 74%. no ischemia (12/2006)  --stress echo 8/10: normal BP response to exercise. no presyncope  no wall motion abnormalties. No significant LVOT obstruction. non-diagnositc ST depression  --holter monitor normal   . CAD (coronary artery disease)     --s/p DES LAD and POBA diagonal 11/10  --RHC cath normal  . COPD (chronic obstructive pulmonary disease)   . Obesity   . Pre-syncope   . CHF (congestive heart failure)     Medications Prior to Admission  Medication Sig Dispense Refill  . amLODipine (NORVASC) 5 MG tablet Take 5 mg by mouth daily.    Marland Kitchen aspirin 81 MG EC tablet Take 81 mg by mouth daily.      Marland Kitchen atorvastatin (LIPITOR) 80 MG tablet Take 80 mg by mouth at bedtime.     Marland Kitchen azithromycin (ZITHROMAX) 250 MG tablet Take 250 mg by mouth daily. z-pak-Started  09/26/14 x5 days    . Calcium Carbonate-Vitamin D (CALCIUM 600 + D PO) Take 600 mg by mouth daily.    Marland Kitchen denosumab (PROLIA) 60 MG/ML SOLN injection Inject 60 mg into the skin every 6 (six) months. Administer in upper arm, thigh, or abdomen    . furosemide (LASIX) 40 MG tablet Take 40 mg by mouth daily.    Marland Kitchen latanoprost (XALATAN) 0.005 % ophthalmic solution Place 1 drop into both eyes at bedtime.    Marland Kitchen losartan (COZAAR) 100 MG tablet  Take 100 mg by mouth daily.      . metoprolol tartrate (LOPRESSOR) 25 MG tablet TAKE 1 TABLET BY MOUTH TWO TIMES A DAY 180 tablet 3     . aspirin EC  81 mg Oral Daily  . atorvastatin  80 mg Oral QHS  . doxycycline  100 mg Oral Q12H  . guaiFENesin  600 mg Oral BID  . heparin  5,000 Units Subcutaneous 3 times per day  . latanoprost  1 drop Both Eyes QHS  . metoprolol tartrate  25 mg Oral BID  . pantoprazole  40 mg Oral Q1200  . sodium chloride  3 mL Intravenous Q12H    Infusions: . sodium chloride 50 mL/hr at 09/29/14 0334    Allergies  Allergen Reactions  . Morphine Shortness Of Breath    Eyes became bloodshot  . Penicillins Rash    History   Social History  . Marital Status: Widowed    Spouse Name: N/A  . Number of Children: N/A  . Years of Education: N/A   Occupational History  . Not on file.   Social History Main Topics  . Smoking status: Former Smoker    Types: Cigarettes    Quit date: 05/09/2000  . Smokeless tobacco: Not on file  . Alcohol Use: No  . Drug Use: No  . Sexual Activity: Not on file   Other Topics Concern  . Not on file   Social History Narrative    Family History  Problem Relation Age of Onset  . CAD Mother   . Multiple myeloma Father   . Diabetes type II Sister   . CAD Brother   . Stroke Sister   . Uterine cancer Sister     PHYSICAL EXAM: Filed Vitals:   09/29/14 1044  BP: 105/38  Pulse: 81  Temp:   Resp:     Intake/Output Summary (Last 24 hours) at 09/29/14 1059 Last data filed at 09/29/14 0945  Gross per 24 hour  Intake    960 ml  Output    300 ml  Net    660 ml    General: Pleasant, NAD. Neuro: Alert and oriented X 3. Moves all extremities spontaneously. Psych: Normal affect. HEENT: Normal Neck: Supple without bruits or JVP 9 Lungs: Resp regular and unlabored, CTA. Heart: RRR no s3, s4. II/VI SEM best heard at RICS Abdomen: obese Soft, non-tender, non-distended, BS + x 4.  Extremities: No clubbing,  cyanosis or edema. DP/PT/Radials 2+ and equal bilaterally.  Results for orders placed or performed during the hospital encounter of 09/28/14 (from the past 24 hour(s))  CBC     Status: Abnormal   Collection Time: 09/28/14 10:17 PM  Result Value Ref Range   WBC 3.6 (L) 4.0 - 10.5 K/uL   RBC 2.53 (L) 3.87 - 5.11 MIL/uL   Hemoglobin 8.2 (L) 12.0 - 15.0 g/dL   HCT 25.1 (L) 36.0 - 46.0 %   MCV 99.2 78.0 - 100.0 fL  MCH 32.4 26.0 - 34.0 pg   MCHC 32.7 30.0 - 36.0 g/dL   RDW 14.4 11.5 - 15.5 %   Platelets 178 150 - 400 K/uL  Basic metabolic panel     Status: Abnormal   Collection Time: 09/28/14 10:17 PM  Result Value Ref Range   Sodium 139 135 - 145 mmol/L   Potassium 4.1 3.5 - 5.1 mmol/L   Chloride 106 101 - 111 mmol/L   CO2 24 22 - 32 mmol/L   Glucose, Bld 153 (H) 65 - 99 mg/dL   BUN 45 (H) 6 - 20 mg/dL   Creatinine, Ser 4.10 (H) 0.44 - 1.00 mg/dL   Calcium 8.7 (L) 8.9 - 10.3 mg/dL   GFR calc non Af Amer 10 (L) >60 mL/min   GFR calc Af Amer 11 (L) >60 mL/min   Anion gap 9 5 - 15  BNP (order ONLY if patient complains of dyspnea/SOB AND you have documented it for THIS visit)     Status: Abnormal   Collection Time: 09/28/14 10:17 PM  Result Value Ref Range   B Natriuretic Peptide 597.6 (H) 0.0 - 100.0 pg/mL  I-stat troponin, ED  (not at [facility])     Status: None   Collection Time: 09/28/14 10:19 PM  Result Value Ref Range   Troponin i, poc 0.02 0.00 - 0.08 ng/mL   Comment 3          POC occult blood, ED     Status: None   Collection Time: 09/28/14 11:06 PM  Result Value Ref Range   Fecal Occult Bld NEGATIVE NEGATIVE  Troponin I     Status: None   Collection Time: 09/28/14 11:32 PM  Result Value Ref Range   Troponin I 0.03 <0.031 ng/mL  Vitamin B12     Status: None   Collection Time: 09/29/14 12:20 AM  Result Value Ref Range   Vitamin B-12 576 180 - 914 pg/mL  Folate     Status: None   Collection Time: 09/29/14 12:20 AM  Result Value Ref Range   Folate 16.3 >5.9 ng/mL    Iron and TIBC     Status: None   Collection Time: 09/29/14 12:20 AM  Result Value Ref Range   Iron 59 28 - 170 ug/dL   TIBC 312 250 - 450 ug/dL   Saturation Ratios 19 10.4 - 31.8 %   UIBC 253 ug/dL  Ferritin     Status: None   Collection Time: 09/29/14 12:20 AM  Result Value Ref Range   Ferritin 154 11 - 307 ng/mL  Reticulocytes     Status: Abnormal   Collection Time: 09/29/14 12:20 AM  Result Value Ref Range   Retic Ct Pct 1.0 0.4 - 3.1 %   RBC. 2.42 (L) 3.87 - 5.11 MIL/uL   Retic Count, Manual 24.2 19.0 - 186.0 K/uL  Troponin I     Status: None   Collection Time: 09/29/14 12:20 AM  Result Value Ref Range   Troponin I 0.03 <0.031 ng/mL  Magnesium     Status: None   Collection Time: 09/29/14  3:40 AM  Result Value Ref Range   Magnesium 2.2 1.7 - 2.4 mg/dL  Phosphorus     Status: Abnormal   Collection Time: 09/29/14  3:40 AM  Result Value Ref Range   Phosphorus 5.4 (H) 2.5 - 4.6 mg/dL  TSH     Status: None   Collection Time: 09/29/14  3:40 AM  Result Value Ref Range  TSH 3.099 0.350 - 4.500 uIU/mL  Comprehensive metabolic panel     Status: Abnormal   Collection Time: 09/29/14  3:40 AM  Result Value Ref Range   Sodium 142 135 - 145 mmol/L   Potassium 4.5 3.5 - 5.1 mmol/L   Chloride 109 101 - 111 mmol/L   CO2 22 22 - 32 mmol/L   Glucose, Bld 110 (H) 65 - 99 mg/dL   BUN 46 (H) 6 - 20 mg/dL   Creatinine, Ser 3.94 (H) 0.44 - 1.00 mg/dL   Calcium 8.6 (L) 8.9 - 10.3 mg/dL   Total Protein 6.0 (L) 6.5 - 8.1 g/dL   Albumin 3.3 (L) 3.5 - 5.0 g/dL   AST 20 15 - 41 U/L   ALT 23 14 - 54 U/L   Alkaline Phosphatase 76 38 - 126 U/L   Total Bilirubin 0.8 0.3 - 1.2 mg/dL   GFR calc non Af Amer 10 (L) >60 mL/min   GFR calc Af Amer 12 (L) >60 mL/min   Anion gap 11 5 - 15  CBC     Status: Abnormal   Collection Time: 09/29/14  3:40 AM  Result Value Ref Range   WBC 3.2 (L) 4.0 - 10.5 K/uL   RBC 2.47 (L) 3.87 - 5.11 MIL/uL   Hemoglobin 8.1 (L) 12.0 - 15.0 g/dL   HCT 24.5 (L) 36.0  - 46.0 %   MCV 99.2 78.0 - 100.0 fL   MCH 32.8 26.0 - 34.0 pg   MCHC 33.1 30.0 - 36.0 g/dL   RDW 14.5 11.5 - 15.5 %   Platelets 169 150 - 400 K/uL  Type and screen     Status: None   Collection Time: 09/29/14  3:40 AM  Result Value Ref Range   ABO/RH(D) O POS    Antibody Screen NEG    Sample Expiration 10/02/2014   Troponin I (q 6hr x 3)     Status: None   Collection Time: 09/29/14  3:40 AM  Result Value Ref Range   Troponin I 0.03 <0.031 ng/mL  ABO/Rh     Status: None   Collection Time: 09/29/14  3:40 AM  Result Value Ref Range   ABO/RH(D) O POS   Influenza panel by PCR (type A & B, H1N1)     Status: None   Collection Time: 09/29/14  3:51 AM  Result Value Ref Range   Influenza A By PCR NEGATIVE NEGATIVE   Influenza B By PCR NEGATIVE NEGATIVE   H1N1 flu by pcr NOT DETECTED NOT DETECTED  Urinalysis, Routine w reflex microscopic     Status: Abnormal   Collection Time: 09/29/14  4:00 AM  Result Value Ref Range   Color, Urine YELLOW YELLOW   APPearance CLEAR CLEAR   Specific Gravity, Urine 1.019 1.005 - 1.030   pH 5.5 5.0 - 8.0   Glucose, UA NEGATIVE NEGATIVE mg/dL   Hgb urine dipstick SMALL (A) NEGATIVE   Bilirubin Urine NEGATIVE NEGATIVE   Ketones, ur NEGATIVE NEGATIVE mg/dL   Protein, ur 30 (A) NEGATIVE mg/dL   Urobilinogen, UA 0.2 0.0 - 1.0 mg/dL   Nitrite NEGATIVE NEGATIVE   Leukocytes, UA NEGATIVE NEGATIVE  Creatinine, urine, random     Status: None   Collection Time: 09/29/14  4:00 AM  Result Value Ref Range   Creatinine, Urine 118.20 mg/dL  Sodium, urine, random     Status: None   Collection Time: 09/29/14  4:00 AM  Result Value Ref Range   Sodium, Ur 56  mmol/L  Urine microscopic-add on     Status: None   Collection Time: 09/29/14  4:00 AM  Result Value Ref Range   Squamous Epithelial / LPF RARE RARE   WBC, UA 3-6 <3 WBC/hpf   RBC / HPF 0-2 <3 RBC/hpf   Bacteria, UA RARE RARE     ECG: 09/29/14 NSR. ST depression inferior leads.  ? Depression in V4-V6.  Difficult to tell 2/2 baseline wander.   Imaging/Studies  Ct Chest Wo Contrast  09/29/2014   CLINICAL DATA:  Shortness of breath and nonproductive cough  EXAM: CT CHEST WITHOUT CONTRAST  TECHNIQUE: Multidetector CT imaging of the chest was performed following the standard protocol without IV contrast.  COMPARISON:  Chest radiograph 09/28/2014, thoracic MRI 05/28/2014  FINDINGS: Mediastinum/Nodes: Moderate atheromatous aortic and coronary arterial calcification without aneurysm. Moderate cardiac enlargement is noted. No pericardial effusion. No lymphadenopathy. Probable vascular at ectasia at the level of the right brachiocephalic artery/subclavian takeoff image 4.  Lungs/Pleura: Areas of patchy ground-glass airspace opacity are identified with interlobular septal thickening and central bronchial wall thickening. Central airways are patent. Trace pleural fluid.  Upper abdomen: The upper abdominal viscera unremarkable where visualized.  Musculoskeletal: T10 vertebra plana reidentified. No new acute osseous abnormality. The bones are subjectively osteopenic.  IMPRESSION: Patchy ground-glass airspace opacity with interlobular septal thickening and bronchial wall thickening most compatible with early pulmonary edema.  Moderate cardiomegaly.   Electronically Signed   By: Conchita Paris M.D.   On: 09/29/2014 10:42   US Renal  09/29/2014   CLINICAL DATA:  Acute renal failure.  EXAM: RENAL / URINARY TRACT ULTRASOUND COMPLETE  COMPARISON:  None.  FINDINGS: Right Kidney:  Length: 11.8 cm. Echogenicity within normal limits. No hydronephrosis visualized. 8 mm cyst right upper renal pole. Right nephrolithiasis noted.  Left Kidney:  Length: 12.1 cm. Echogenicity within normal limits. No hydronephrosis visualized. 8 mm cyst left lower renal pole.  Bladder:  Appears normal for degree of bladder distention.  IMPRESSION: Bilateral simple renal cysts. Right nephrolithiasis. Exam is otherwise normal. No evidence of  hydronephrosis or bladder distention.   Electronically Signed   By: [place]   On: 09/29/2014 10:14   Dg Chest Port 1 View  09/28/2014   CLINICAL DATA:  Shortness of breath and chest pressure with dry cough for the past 3 weeks. Recent diagnosis of congestive heart failure.  EXAM: PORTABLE CHEST - 1 VIEW  COMPARISON:  09/23/2014; 09/22/2012  FINDINGS: Grossly unchanged enlarged cardiac silhouette and mediastinal contours with partially calcified right suprahilar lymph nodes. Grossly unchanged bibasilar heterogeneous opacities, left greater than right, likely atelectasis or scar. No new focal airspace opacities. No pleural effusion or pneumothorax. No evidence of edema. No acute osseus abnormalities.  IMPRESSION: 1.  No acute cardiopulmonary disease. 2. Grossly unchanged bibasilar atelectasis/scar, left greater than right.   Electronically Signed   By: Sandi Mariscal M.D.   On: 09/28/2014 22:15     ASSESSMENT:  1. Acute renal failure - baseline creatinine 1.1 2.  Chronic Diastolic Heart Failure 3. Chronic dyspnea 4. CAD s/p LAD stent 2010 5. Hypertrophic cardiomyopathy without significant LVOT gradient in past 6. Obesity 7. Chest pain .    PLAN/DISCUSSION:  Will follow during her stay.  She appears euvolemic currently. Cont to hold diuretics.  Symptoms likely 2/2 to aggressive diuresis at [facility]. Pt complains of chronic cough and may have been diuresed symptomatically past a necessary point. First troponins normal. Will discuss with MD ref any possible med changes.  ARF and anemia to be managed by primary team.    Oda Kilts, PA-C  Patient seen and examined with Oda Kilts, PA-C. We discussed all aspects of the encounter. I agree with the assessment and plan as stated above.   Difficult situation. She has had a very long h/o of severe dyspnea and fatigue. I did an extensive work-up of this in 2010 and 2011 as described in HPI. Recently admitted to [facility] with worsening  dyspnea and cough and felt to have decompensated HF. Diuresed and sent home on lasix (new for her). She is now admitted with acute renal failure due to overdiuresis. Renal function improving slowly with hydration. She is now fluid replete on exam and CXR even shows early edema. Would stop IVFs now and follow renal function. I suspect she has very narrow euvolemic window and we need to be very careful with volume loading as well as resumption of any diuretics.   Will repeat echo to reassess for LVOT gradient as well as for pulmonary HTN. If pulmonary pressures high can consider repeat RHC (CT can be seen in PVOD but doubt she has it). Will also get lexiscan myoview to reassess for obstructive CAD however her dyspnea never improved after we stented her LAD in 2010.   She is extremely deconditioned and I have suggested a SNF stay at [facility] on d/c for rehab.   Emmelina Mcloughlin,MD 2:10 PM

## 2014-09-29 NOTE — ED Notes (Signed)
Asked to hold the pot for dr Bunnie Domino

## 2014-09-29 NOTE — ED Notes (Signed)
Pt alert no distress  Iv nss started approx 1 hour ago.

## 2014-09-29 NOTE — Progress Notes (Signed)
Report called to ED nurse, nurse states patient will be up to the floor shortly.  Shelbie Hutching, RN, BSN

## 2014-09-29 NOTE — H&P (Signed)
PCP: Neale Burly, MD  Cardiology [facility]   Referring provider [place]   Chief Complaint:  Shortness of breath  HPI: [name] [name] is a 74 y.o. female   has a past medical history of HTN (hypertension); Hyperlipidemia; Hypertrophic cardiomyopathy; CAD (coronary artery disease); COPD (chronic obstructive pulmonary disease); Obesity; Pre-syncope; and CHF (congestive heart failure).   Presented with shortness of breath and chest tightness. She report worsening cough for the past few days. She denies any fever. Cough is non productive. She has been struggling to do basic activities due to shortness of breath. Denies any wheezing.   Patient had been recently admitted to [facility] from May 17 to May 19. She has been aggressively diuresed during her hospital stay. At time of discharge creatinine 1.11. BNP 2400. Echogram showed preserved EF of 65% with hypertrophy of left ventricle septum.  Patient continues to have intermittent chest tightness. In emergency department she was found to have new onset of acute renal failure with creatinine of 4.1 chest extremities no evidence of infiltrate. Troponin unremarkable. EKG worrisome for some ST depressions. Of note patient has  anemia baseline hemoglobin 8.7 she was diagnosed with H. pylori gastritis. Protonix and Zpack was started. Patient is Hemoccult negative, with low normal iron.  Of note Patiet had Negative Myoview in 2014  Hospitalist was called for admission for acute renal failure and chest pain  Review of Systems:    Pertinent positives include: Shortness of breath cough  Constitutional:  No weight loss, night sweats, Fevers, chills, fatigue, weight loss  HEENT:  No headaches, Difficulty swallowing,Tooth/dental problems,Sore throat,  No sneezing, itching, ear ache, nasal congestion, post nasal drip,  Cardio-vascular:  No chest pain, Orthopnea, PND, anasarca, dizziness, palpitations.no Bilateral lower extremity  swelling  GI:  No heartburn, indigestion, abdominal pain, nausea, vomiting, diarrhea, change in bowel habits, loss of appetite, melena, blood in stool, hematemesis Resp:   No excess mucus,  No coughing up of blood.No change in color of mucus.No wheezing. Skin:  no rash or lesions. No jaundice GU:  no dysuria, change in color of urine, no urgency or frequency. No straining to urinate.  No flank pain.  Musculoskeletal:  No joint pain or no joint swelling. No decreased range of motion. No back pain.  Psych:  No change in mood or affect. No depression or anxiety. No memory loss.  Neuro: no localizing neurological complaints, no tingling, no weakness, no double vision, no gait abnormality, no slurred speech, no confusion  Otherwise ROS are negative except for above, 10 systems were reviewed  Past Medical History: Past Medical History  Diagnosis Date  . HTN (hypertension)   . Hyperlipidemia   . Hypertrophic cardiomyopathy     Mild hypertrophic CM without outflow tract obstruction -- ETT/Myoview EF 74%. no ischemia (12/2006)  --stress echo 8/10: normal BP response to exercise. no presyncope  no wall motion abnormalties. No significant LVOT obstruction. non-diagnositc ST depression  --holter monitor normal   . CAD (coronary artery disease)     --s/p DES LAD and POBA diagonal 11/10  --RHC cath normal  . COPD (chronic obstructive pulmonary disease)   . Obesity   . Pre-syncope   . CHF (congestive heart failure)    History reviewed. No pertinent past surgical history.   Medications: Prior to Admission medications   Medication Sig Start Date End Date Taking? Authorizing Provider  amLODipine (NORVASC) 5 MG tablet Take 5 mg by mouth daily.   Yes Historical Provider, MD  aspirin  81 MG EC tablet Take 81 mg by mouth daily.     Yes Historical Provider, MD  atorvastatin (LIPITOR) 80 MG tablet Take 80 mg by mouth at bedtime.    Yes Historical Provider, MD  azithromycin (ZITHROMAX) 250 MG tablet  Take 250 mg by mouth daily. z-pak-Started 09/26/14 x5 days 09/26/14 10/01/14 Yes Historical Provider, MD  Calcium Carbonate-Vitamin D (CALCIUM 600 + D PO) Take 600 mg by mouth daily.   Yes Historical Provider, MD  denosumab (PROLIA) 60 MG/ML SOLN injection Inject 60 mg into the skin every 6 (six) months. Administer in upper arm, thigh, or abdomen   Yes Historical Provider, MD  furosemide (LASIX) 40 MG tablet Take 40 mg by mouth daily.   Yes Historical Provider, MD  losartan (COZAAR) 100 MG tablet Take 100 mg by mouth daily.     Yes Historical Provider, MD  metoprolol tartrate (LOPRESSOR) 25 MG tablet TAKE 1 TABLET BY MOUTH TWO TIMES A DAY 03/06/14  Yes Sherren Mocha, MD    Allergies:   Allergies  Allergen Reactions  . Morphine Shortness Of Breath    Eyes became bloodshot  . Penicillins Rash    Social History:  Ambulatory   independently   Lives at home alone,          reports that she quit smoking about 14 years ago. Her smoking use included Cigarettes. She does not have any smokeless tobacco history on file. She reports that she does not drink alcohol or use illicit drugs.    Family History: family history includes CAD in her brother and mother; Diabetes type II in her sister; Multiple myeloma in her father; Stroke in her sister; Uterine cancer in her sister.    Physical Exam: Patient Vitals for the past 24 hrs:  BP Temp Temp src Pulse Resp SpO2 Weight  09/28/14 2330 109/84 mmHg - - 85 15 96 % -  09/28/14 2215 (!) 135/41 mmHg - - 90 17 98 % -  09/28/14 2200 119/58 mmHg - - 92 17 96 % -  09/28/14 2145 - - - - - - 77.021 kg (169 lb 12.8 oz)  09/28/14 2143 108/68 mmHg 98.1 F (36.7 C) Oral 94 20 100 % -    1. General:  in No Acute distress 2. Psychological: Alert and  Oriented 3. Head/ENT:    Dry Mucous Membranes                          Head Non traumatic, neck supple                          Normal   Dentition 4. SKIN:   decreased Skin turgor,  Skin clean Dry and intact no  rash 5. Heart: Regular rate and rhythm no Murmur, Rub or gallop 6. Lungs:mild  wheezes no crackles   7. Abdomen: Soft, non-tender, Non distended 8. Lower extremities: no clubbing, cyanosis, or edema 9. Neurologically Grossly intact, moving all 4 extremities equally 10. MSK: Normal range of motion  body mass index is 31.05 kg/(m^2).   Labs on Admission:   Results for orders placed or performed during the hospital encounter of 09/28/14 (from the past 24 hour(s))  CBC     Status: Abnormal   Collection Time: 09/28/14 10:17 PM  Result Value Ref Range   WBC 3.6 (L) 4.0 - 10.5 K/uL   RBC 2.53 (L) 3.87 - 5.11 MIL/uL   Hemoglobin  8.2 (L) 12.0 - 15.0 g/dL   HCT 25.1 (L) 36.0 - 46.0 %   MCV 99.2 78.0 - 100.0 fL   MCH 32.4 26.0 - 34.0 pg   MCHC 32.7 30.0 - 36.0 g/dL   RDW 14.4 11.5 - 15.5 %   Platelets 178 150 - 400 K/uL  Basic metabolic panel     Status: Abnormal   Collection Time: 09/28/14 10:17 PM  Result Value Ref Range   Sodium 139 135 - 145 mmol/L   Potassium 4.1 3.5 - 5.1 mmol/L   Chloride 106 101 - 111 mmol/L   CO2 24 22 - 32 mmol/L   Glucose, Bld 153 (H) 65 - 99 mg/dL   BUN 45 (H) 6 - 20 mg/dL   Creatinine, Ser 4.10 (H) 0.44 - 1.00 mg/dL   Calcium 8.7 (L) 8.9 - 10.3 mg/dL   GFR calc non Af Amer 10 (L) >60 mL/min   GFR calc Af Amer 11 (L) >60 mL/min   Anion gap 9 5 - 15  BNP (order ONLY if patient complains of dyspnea/SOB AND you have documented it for THIS visit)     Status: Abnormal   Collection Time: 09/28/14 10:17 PM  Result Value Ref Range   B Natriuretic Peptide 597.6 (H) 0.0 - 100.0 pg/mL  I-stat troponin, ED  (not at [facility])     Status: None   Collection Time: 09/28/14 10:19 PM  Result Value Ref Range   Troponin i, poc 0.02 0.00 - 0.08 ng/mL   Comment 3          POC occult blood, ED     Status: None   Collection Time: 09/28/14 11:06 PM  Result Value Ref Range   Fecal Occult Bld NEGATIVE NEGATIVE  Troponin I     Status: None   Collection Time: 09/28/14  11:32 PM  Result Value Ref Range   Troponin I 0.03 <0.031 ng/mL    UA pending  No results found for: HGBA1C  Estimated Creatinine Clearance: 11.2 mL/min (by C-G formula based on Cr of 4.1).  BNP (last 3 results) No results for input(s): PROBNP in the last 8760 hours.  Other results:  I have pearsonaly reviewed this: ECG REPORT  Rate: 94  Rhythm: Right bundle branch block ST&T Change:  with ST depression in multiple leads QTC 520  Filed Weights   09/28/14 2145  Weight: 77.021 kg (169 lb 12.8 oz)     Cultures: No results found for: SDES, SPECREQUEST, CULT, REPTSTATUS   Radiological Exams on Admission: Dg Chest Port 1 View  09/28/2014   CLINICAL DATA:  Shortness of breath and chest pressure with dry cough for the past 3 weeks. Recent diagnosis of congestive heart failure.  EXAM: PORTABLE CHEST - 1 VIEW  COMPARISON:  09/23/2014; 09/22/2012  FINDINGS: Grossly unchanged enlarged cardiac silhouette and mediastinal contours with partially calcified right suprahilar lymph nodes. Grossly unchanged bibasilar heterogeneous opacities, left greater than right, likely atelectasis or scar. No new focal airspace opacities. No pleural effusion or pneumothorax. No evidence of edema. No acute osseus abnormalities.  IMPRESSION: 1.  No acute cardiopulmonary disease. 2. Grossly unchanged bibasilar atelectasis/scar, left greater than right.   Electronically Signed   By: Sandi Mariscal M.D.   On: 09/28/2014 22:15    Chart has been reviewed  Family  at  Bedside  plan of care was discussed with  Son,   Assessment/Plan  74 year old female history of upper trophic heart failure with recent admission and aggressive diuresis  now presents with acute renal failure persistent cough shortness of breath. Patient was found to be anemic and Hemoccult negative stool.    Present on Admission:  . Diastolic heart failure - currently appears to be fluid down. We'll hold Lasix, gently rehydrate, follow creatinine  levels. Family wishes for cardiology consult they would like to be seen by Dr. Haroldine Laws as he has seen patient in the past.  . ARF (acute renal failure) sciatica secondary to over diuresis will obtain urine electrolytes and renal ultrasound gentle IV fluids. If no evidence of improvement will need renal consult  . Chest pain atypical and mild continued to monitor on telemetry cycle cardiac enzymes obtain serial EKG  . Anemia - etiology unclear Hemoccult negative no evidence of severe iron deficiency. Patient likely symptomatic at this level of anemia. Anticipate hemoglobin drop in the setting of rehydration. Obtain type and screen we'll transfuse for hemoglobin below 8 given  Symptomatology. Patient likely benefit from hematology evaluation   bronchitis  - patient continues to have some shortness of breath and persistent cough. Chest x-ray show no evidence of pneumonia. Patient does not have any oxygen requirement. Chest exam significant for mild occasional wheezing. We'll treat to bronchitis. Cover with doxycycline. Patient had been on a Z-Pak prior to this for treatment H pylori but given somewhat prolonged QTC will hold off on  Prolonged we'll repeat EKG hold off on QTC prolonging medications  Prophylaxis:   Lovenox   CODE STATUS:  FULL CODE  as per patient   Disposition:  To home once workup is complete and patient is stable  Other plan as per orders.  I have spent a total of 55 min on this admission  Lynnita Somma 09/29/2014, 12:50 AM  [facility]  Pager [phone]   after 2 AM please page floor coverage PA If 7AM-7PM, please contact the day team taking care of the patient  [email or web address]  Password [email or web address]

## 2014-09-29 NOTE — ED Notes (Signed)
Report called to rn on the floor

## 2014-09-29 NOTE — ED Notes (Signed)
Admitting doctor still at the bedsdie

## 2014-09-30 ENCOUNTER — Inpatient Hospital Stay (HOSPITAL_COMMUNITY): Payer: Medicare Other

## 2014-09-30 ENCOUNTER — Other Ambulatory Visit: Payer: Self-pay | Admitting: Cardiology

## 2014-09-30 DIAGNOSIS — I5043 Acute on chronic combined systolic (congestive) and diastolic (congestive) heart failure: Secondary | ICD-10-CM | POA: Insufficient documentation

## 2014-09-30 DIAGNOSIS — R079 Chest pain, unspecified: Secondary | ICD-10-CM

## 2014-09-30 DIAGNOSIS — I10 Essential (primary) hypertension: Secondary | ICD-10-CM | POA: Insufficient documentation

## 2014-09-30 LAB — NM MYOCAR MULTI W/SPECT W/WALL MOTION / EF
CHL CUP STRESS STAGE 1 GRADE: 0 %
CHL CUP STRESS STAGE 1 HR: 74 {beats}/min
CHL CUP STRESS STAGE 1 SBP: 120 mmHg
CHL CUP STRESS STAGE 2 GRADE: 0 %
CHL CUP STRESS STAGE 2 HR: 74 {beats}/min
CHL CUP STRESS STAGE 3 DBP: 48 mmHg
CHL CUP STRESS STAGE 3 SBP: 130 mmHg
CHL CUP STRESS STAGE 4 DBP: 48 mmHg
CHL CUP STRESS STAGE 4 HR: 87 {beats}/min
CSEPEW: 1 METS
LV dias vol: 93 mL
LV sys vol: 29 mL
Peak BP: 131 mmHg
Peak HR: 87 {beats}/min
Percent of predicted max HR: 60 %
RATE: 0.33
SDS: 4
SRS: 0
SSS: 4
Stage 1 DBP: 40 mmHg
Stage 1 Speed: 0 mph
Stage 2 Speed: 0 mph
Stage 3 Grade: 0 %
Stage 3 HR: 89 {beats}/min
Stage 3 Speed: 0 mph
Stage 4 Grade: 0 %
Stage 4 SBP: 131 mmHg
Stage 4 Speed: 0 mph
TID: 0.93

## 2014-09-30 LAB — CBC
HCT: 22.7 % — ABNORMAL LOW (ref 36.0–46.0)
HEMOGLOBIN: 7.4 g/dL — AB (ref 12.0–15.0)
MCH: 32.7 pg (ref 26.0–34.0)
MCHC: 32.6 g/dL (ref 30.0–36.0)
MCV: 100.4 fL — AB (ref 78.0–100.0)
Platelets: 149 10*3/uL — ABNORMAL LOW (ref 150–400)
RBC: 2.26 MIL/uL — ABNORMAL LOW (ref 3.87–5.11)
RDW: 14.7 % (ref 11.5–15.5)
WBC: 2.7 10*3/uL — AB (ref 4.0–10.5)

## 2014-09-30 LAB — BASIC METABOLIC PANEL
Anion gap: 10 (ref 5–15)
BUN: 50 mg/dL — AB (ref 6–20)
CALCIUM: 8.4 mg/dL — AB (ref 8.9–10.3)
CO2: 22 mmol/L (ref 22–32)
CREATININE: 3.69 mg/dL — AB (ref 0.44–1.00)
Chloride: 110 mmol/L (ref 101–111)
GFR calc non Af Amer: 11 mL/min — ABNORMAL LOW (ref >60)
GFR, EST AFRICAN AMERICAN: 13 mL/min — AB (ref >60)
Glucose, Bld: 117 mg/dL — ABNORMAL HIGH (ref 65–99)
Potassium: 4.6 mmol/L (ref 3.5–5.1)
Sodium: 142 mmol/L (ref 135–145)

## 2014-09-30 LAB — HEMOGLOBIN A1C
HEMOGLOBIN A1C: 6.3 % — AB (ref 4.8–5.6)
Mean Plasma Glucose: 134 mg/dL

## 2014-09-30 MED ORDER — TECHNETIUM TC 99M SESTAMIBI - CARDIOLITE
30.0000 | Freq: Once | INTRAVENOUS | Status: AC | PRN
  Administered 2014-09-30: 30 via INTRAVENOUS

## 2014-09-30 MED ORDER — REGADENOSON 0.4 MG/5ML IV SOLN
0.4000 mg | Freq: Once | INTRAVENOUS | Status: DC
  Filled 2014-09-30: qty 5

## 2014-09-30 MED ORDER — ONDANSETRON HCL 4 MG/2ML IJ SOLN
4.0000 mg | Freq: Four times a day (QID) | INTRAMUSCULAR | Status: DC | PRN
  Administered 2014-09-30 – 2014-10-10 (×7): 4 mg via INTRAVENOUS
  Filled 2014-09-30 (×7): qty 2

## 2014-09-30 MED ORDER — TECHNETIUM TC 99M SESTAMIBI GENERIC - CARDIOLITE
10.0000 | Freq: Once | INTRAVENOUS | Status: AC | PRN
  Administered 2014-09-30: 10 via INTRAVENOUS

## 2014-09-30 MED ORDER — REGADENOSON 0.4 MG/5ML IV SOLN
INTRAVENOUS | Status: AC
  Filled 2014-09-30: qty 5

## 2014-09-30 NOTE — Progress Notes (Signed)
Patient ID: [name] [name], female   DOB: 11-21-1940, 74 y.o.   MRN: [id number] [facility] PROGRESS NOTE  RANA HOCHSTEIN PZW:[id number] DOB: 03-Nov-1940 DOA: 09/28/2014 PCP: Neale Burly, MD  Brief narrative:    74 year old female with past medical history of chronic diastolic CHF, hypertrophy cardiomyopathy, extensive cardiac work up in past including right and left heart catheterization in 2010 (with 90% LAD lesion which was stented), COPD, hypertension, recent hospitalization in [facility] hospital for acute diastolic CHF where she underwent pretty aggressive diureses. Patient now presented to [facility] ED with intermittent chest tightness, cough ever since discharge from [facility].   On this admission, she was found to have acute renal failure with creatinine level in 4 range (her baseline is normal creatinine). Initial troponin was WNL. Cardio has seen in her consultation and plans for stress test. Of note, CXR on admission showed possible early pulmonary edema.  Barrier to discharge: will have stress test today. We are also monitoring renal function. Ideally her creatinine should come as close as possible to her baseline prior to discharge. Current creatinine is 3.69 and as mentioned previous know Cr is  1.1.  Assessment/Plan:    Principal Problem: Chest pain - In pt with extensive cardiac history and work up, concern is always for ACS - Her troponin levels remain WNL - She will go for stress test today  - Continue aspirin, Lipitor and metoprolol  - Cardiology is following, appreciate their recommendations   Active Problems: Acute renal failure - Unclear etiology - Normal Cr is baseline value and on this admission 4.1 - It is improving with IV fluids  Acute on chronic diastolic heart failure - Pt with history of diastolic CHF - 2 D ECHO on this admission showed normal EF but septal hypertrophy - BNP 597 on this admission - She has received IV fluids on admission.  Currently not on IV fluids - Weight since admission, 77 kg --> 78.9 kg - Will follow up with cardio about CHF management. Pt currently not on lasix due to acute renal failure. - Continue daily weight and strict intake and output   Essential hypertension - Continue metoprolol 25 mg PO BID  Dyslipidemia - Continue statin therapy   DVT Prophylaxis  - Heprain subQ ordered   Code Status: Full.  Family Communication:  plan of care discussed with the patient Disposition Plan: Home once creatinine closer to baseline values.   IV access:  Peripheral IV  Procedures and diagnostic studies:    Ct Chest Wo Contrast 2014-10-19 Patchy ground-glass airspace opacity with interlobular septal thickening and bronchial wall thickening most compatible with early pulmonary edema.  Moderate cardiomegaly.   Electronically Signed   By: Conchita [place] M.D.   On: October 19, 2014 10:42   US Renal 10-19-14  Bilateral simple renal cysts. Right nephrolithiasis. Exam is otherwise normal. No evidence of hydronephrosis or bladder distention.   Electronically Signed   By: [place]   On: 10/19/14 10:14   Nm Myocar Multi W/spect Tamela Oddi Motion / Ef 09/30/2014  Dg Chest Port 1 View 09/28/2014  1.  No acute cardiopulmonary disease. 2. Grossly unchanged bibasilar atelectasis/scar, left greater than right.     Medical Consultants:  Cardiology   Other Consultants:  None   IAnti-Infectives:   Doxycycline 19-Oct-2014 -->   Leisa Lenz, MD  [facility] Pager [phone]  Time spent in minutes: 25 minutes  If 7PM-7AM, please contact night-coverage [email or web address] Password [facility] 2 09/30/2014, 7:42 PM   LOS: 1  day    HPI/Subjective: No acute overnight events. Patient reports nausea after stress test. No vomiting.   Objective: Filed Vitals:   09/30/14 1024 09/30/14 1028 09/30/14 1134 09/30/14 1715  BP: 131/48  129/44 126/48  Pulse: 87 90 84 82  Temp:   98.5 F (36.9 C) 98.4 F (36.9 C)  TempSrc:    Oral Oral  Resp:   17 17  Weight:      SpO2:   100% 100%    Intake/Output Summary (Last 24 hours) at 09/30/14 1942 Last data filed at 09/30/14 1300  Gross per 24 hour  Intake 1756.25 ml  Output      0 ml  Net 1756.25 ml    Exam:   General:  Pt is alert, follows commands appropriately, not in acute distress  Cardiovascular: Regular rate and rhythm, S1/S2 (+)  Respiratory: diminished breath sounds bilaterally, no wheezing, no crackles, no rhonchi  Abdomen: Soft, non tender, non distended, bowel sounds present  Extremities: No edema, pulses DP and PT palpable bilaterally  Neuro: Grossly nonfocal  Data Reviewed: Basic Metabolic Panel:  Recent Labs Lab 09/28/14 2217 09/29/14 0340 09/29/14 1214 09/30/14 0447  NA 139 142 141 142  K 4.1 4.5 4.2 4.6  CL 106 109 109 110  CO2 24 22 21* 22  GLUCOSE 153* 110* 142* 117*  BUN 45* 46* 45* 50*  CREATININE 4.10* 3.94* 3.82* 3.69*  CALCIUM 8.7* 8.6* 8.3* 8.4*  MG  --  2.2  --   --   PHOS  --  5.4*  --   --    Liver Function Tests:  Recent Labs Lab 09/29/14 0340  AST 20  ALT 23  ALKPHOS 76  BILITOT 0.8  PROT 6.0*  ALBUMIN 3.3*   No results for input(s): LIPASE, AMYLASE in the last 168 hours. No results for input(s): AMMONIA in the last 168 hours. CBC:  Recent Labs Lab 09/28/14 2217 09/29/14 0340 09/30/14 0447  WBC 3.6* 3.2* 2.7*  HGB 8.2* 8.1* 7.4*  HCT 25.1* 24.5* 22.7*  MCV 99.2 99.2 100.4*  PLT 178 169 149*   Cardiac Enzymes:  Recent Labs Lab 09/28/14 2332 09/29/14 0020 09/29/14 0340 09/29/14 1214 09/29/14 1818  TROPONINI 0.03 0.03 0.03 0.03 <0.03   BNP: Invalid input(s): POCBNP CBG: No results for input(s): GLUCAP in the last 168 hours.  No results found for this or any previous visit (from the past 240 hour(s)).   Scheduled Meds: . aspirin EC  81 mg Oral Daily  . atorvastatin  80 mg Oral QHS  . doxycycline  100 mg Oral Q12H  . heparin  5,000 Units Subcutaneous 3 times per day  .  metoprolol tartrate  25 mg Oral BID  . pantoprazole  40 mg Oral Q1200

## 2014-09-30 NOTE — Progress Notes (Signed)
Patient Profile: 74 y.o. female with history of obesity, HTN, Diastolic HF, Hypertrophic CM, CAD, and COPD, who presented to [facility] 5/23 for coughing and intermittent chest tightness. She had a recent admission for CHF to [facility] from May 17-19. She states she was diuresed extensively during her stay, but left with a Creatnine of 1.11 and BNP 2400. Echo showed EF of 65% with LV septum hypertrophy. On arrival to [facility], was found to be in ARF with Scr of 4.1   Subjective: No complaints currently CP free.   Objective: Vital signs in last 24 hours: Temp:  [97.6 F (36.4 C)-98.2 F (36.8 C)] 97.6 F (36.4 C) (05/24 0509) Pulse Rate:  [77-85] 77 (05/24 0509) Resp:  [16-18] 18 (05/24 0509) BP: (105-133)/(38-50) 110/38 mmHg (05/24 0509) SpO2:  [97 %-99 %] 97 % (05/24 0509) Weight:  [174 lb (78.926 kg)] 174 lb (78.926 kg) (05/23 2159) Last BM Date: 09/28/14  Intake/Output from previous day: 05/23 0701 - 05/24 0700 In: 2476.3 [P.O.:960; I.V.:1516.3] Out: 850 [Urine:850] Intake/Output this shift:    Medications Current Facility-Administered Medications  Medication Dose Route Frequency Provider Last Rate Last Dose  . acetaminophen (TYLENOL) tablet 650 mg  650 mg Oral Q6H PRN Toy Baker, MD       Or  . acetaminophen (TYLENOL) suppository 650 mg  650 mg Rectal Q6H PRN Toy Baker, MD      . albuterol (PROVENTIL) (2.5 MG/3ML) 0.083% nebulizer solution 2.5 mg  2.5 mg Nebulization Q4H PRN Theodis Blaze, MD      . aspirin EC tablet 81 mg  81 mg Oral Daily Toy Baker, MD   81 mg at 09/29/14 1045  . atorvastatin (LIPITOR) tablet 80 mg  80 mg Oral QHS Toy Baker, MD   80 mg at 09/29/14 2202  . doxycycline (VIBRA-TABS) tablet 100 mg  100 mg Oral Q12H Toy Baker, MD   100 mg at 09/29/14 2201  . guaiFENesin (MUCINEX) 12 hr tablet 600 mg  600 mg Oral BID Toy Baker, MD   600 mg at 09/29/14 2200  . guaiFENesin-dextromethorphan (ROBITUSSIN DM)  100-10 MG/5ML syrup 5 mL  5 mL Oral Q4H PRN Toy Baker, MD   5 mL at 09/29/14 1927  . heparin injection 5,000 Units  5,000 Units Subcutaneous 3 times per day Toy Baker, MD   5,000 Units at 09/30/14 0523  . latanoprost (XALATAN) 0.005 % ophthalmic solution 1 drop  1 drop Both Eyes QHS Toy Baker, MD   1 drop at 09/29/14 2201  . metoprolol tartrate (LOPRESSOR) tablet 25 mg  25 mg Oral BID Toy Baker, MD   25 mg at 09/29/14 2203  . pantoprazole (PROTONIX) EC tablet 40 mg  40 mg Oral Q1200 Toy Baker, MD   40 mg at 09/29/14 1202  . regadenoson (LEXISCAN) 0.4 MG/5ML injection SOLN           . regadenoson (LEXISCAN) injection SOLN 0.4 mg  0.4 mg Intravenous Once Jolaine Artist, MD      . sodium chloride 0.9 % injection 3 mL  3 mL Intravenous Q12H Toy Baker, MD   3 mL at 09/29/14 2203    PE: General appearance: alert, cooperative and no distress Neck: no carotid bruit and no JVD Lungs: clear to auscultation bilaterally Heart: regular rate and rhythm and 2/6 SM Extremities: no LEE Pulses: 2+ and symmetric Skin: warm and dry Neurologic: Grossly normal  Lab Results:   Recent Labs  09/28/14 2217 09/29/14 0340 09/30/14 0447  WBC 3.6* 3.2* 2.7*  HGB 8.2* 8.1* 7.4*  HCT 25.1* 24.5* 22.7*  PLT 178 169 149*   BMET  Recent Labs  09/29/14 0340 09/29/14 1214 09/30/14 0447  NA 142 141 142  K 4.5 4.2 4.6  CL 109 109 110  CO2 22 21* 22  GLUCOSE 110* 142* 117*  BUN 46* 45* 50*  CREATININE 3.94* 3.82* 3.69*  CALCIUM 8.6* 8.3* 8.4*   PT/INR No results for input(s): LABPROT, INR in the last 72 hours. Cholesterol No results for input(s): CHOL in the last 72 hours. Cardiac Panel (last 3 results)  Recent Labs  09/29/14 0340 09/29/14 1214 09/29/14 1818  TROPONINI 0.03 0.03 <0.03    Studies/Results: NST pending    Assessment/Plan    Principal Problem:   Acute renal failure Active Problems:   DYSPNEA   Diastolic heart  failure   Chest pain   Anemia   Chronic diastolic heart failure   1. Chest Pain: currently CP free. Cardiac enzymes negative x 3. NST pending. [facility] to read.   2. ARF: felt to be 2/2 over diuresis after recent hospitalization. SCr improving with IVFs. Scr 4.1-->3.69. No nephrotoxic agents.   3. Chronic Diastolic CHF: EF 19-14% on echo 09/29/14. Use caution with IVF hydration. Monitor fluid status closely. Strict I/Os, daily weights and low sodium diet.     LOS: 1 day    Brittainy M. Ladoris Gene 09/30/2014 10:10 AM  Attending Note:   The patient was seen and examined.  Agree with assessment and plan as noted above.  Changes made to the above note as needed.   myoview was done today . Results are not back yet. Allowing her to rehydrate.  Breathing is ok   Thayer Headings, Brooke Bonito., MD, [facility] 09/30/2014, 1:17 PM [address],  [place] Pager [phone]

## 2014-09-30 NOTE — Evaluation (Signed)
Physical Therapy Evaluation Patient Details Name: [name] [name] MRN: [id number] DOB: [birth year] Today's Date: 09/30/2014   History of Present Illness  74 yo female presented to ED with SOB, chest tightness, and cough; has a past medical history of HTN (hypertension); Hyperlipidemia; Hypertrophic cardiomyopathy; CAD (coronary artery disease); COPD (chronic obstructive pulmonary disease); Obesity; Pre-syncope; and CHF (congestive heart failure).; recent admission to [facility] admitted with above diagnosis. Pt currently with functional limitations due to the deficits listed below (see PT Problem List).  Pt will benefit from skilled PT to increase their independence and safety with mobility to allow discharge to the venue listed below.    Overall pt is managing well, and VSS on Room Air; Pt reports she can have family assist with laundry, and IADLs initially upon return home; Must be independent and safe with mobility and ADLs; Will follow and monitor for progress; I do not feel strongly she needs SNF for rehab    Follow Up Recommendations No PT follow up;Other (comment) (perhaps HHRN for chronic disease management)    Equipment Recommendations  None recommended by PT    Recommendations for Other Services       Precautions / Restrictions Precautions Precautions: None      Mobility  Bed Mobility Overal bed mobility: Modified Independent             General bed mobility comments: used bedrails  Transfers Overall transfer level: Needs assistance Equipment used: 1 person hand held assist Transfers: Sit to/from Stand Sit to Stand: Min assist         General transfer comment: Cues to self-monitor for activity tolerance; min assist for steadiness  Ambulation/Gait Ambulation/Gait assistance: Min guard Ambulation Distance (Feet):  (pivot steps bed to chair) Assistive device: None       General Gait Details: amb distance limited by  nausea  Stairs            Wheelchair Mobility    Modified Rankin (Stroke Patients Only)       Balance Overall balance assessment: No apparent balance deficits (not formally assessed)                                           Pertinent Vitals/Pain Pain Assessment: No/denies pain (just quite nauseated after stress test)    Home Living Family/patient expects to be discharged to:: Private residence Living Arrangements: Alone Available Help at Discharge: Family;Available PRN/intermittently Type of Home: House Home Access: Stairs to enter   Entrance Stairs-Number of Steps: 2-3 Home Layout: Two level;Laundry or work area in [place]: Environmental consultant - 2 wheels (from previous LE fracture)      Prior Function Level of Independence: Independent               Hand Dominance        Extremity/Trunk Assessment   Upper Extremity Assessment: Overall WFL for tasks assessed           Lower Extremity Assessment: Generalized weakness         Communication   Communication: No difficulties  Cognition Arousal/Alertness: Awake/alert Behavior During Therapy: WFL for tasks assessed/performed Overall Cognitive Status: Within Functional Limits for tasks assessed                      General Comments General comments (skin integrity, edema, etc.): Session conducted on  Room Air and sats remained greater than or equal to 97%; HR stable    Exercises        Assessment/Plan    PT Assessment Patient needs continued PT services  PT Diagnosis Generalized weakness   PT Problem List Decreased strength;Decreased activity tolerance;Decreased mobility  PT Treatment Interventions DME instruction;Gait training;Stair training;Functional mobility training;Therapeutic activities;Therapeutic exercise;Patient/family education   PT Goals (Current goals can be found in the Care Plan section) Acute Rehab PT Goals Patient Stated Goal: hopes to feel  better PT Goal Formulation: With patient Time For Goal Achievement: 10/14/14 Potential to Achieve Goals: Good    Frequency Min 3X/week   Barriers to discharge Decreased caregiver support Must be modified independent to go home    Co-evaluation               End of Session Equipment Utilized During Treatment: Other (comment) (pulse oximeter) Activity Tolerance: Patient tolerated treatment well;Other (comment) (even though she was quite nauseated) Patient left: in chair;with call bell/phone within reach;with nursing/sitter in room Nurse Communication: Mobility status         Time: 0102-7253 PT Time Calculation (min) (ACUTE ONLY): 14 min   Charges:   PT Evaluation $Initial PT Evaluation Tier I: 1 Procedure     PT G CodesRoney Marion Hamff 09/30/2014, 2:04 PM  Roney Marion, [place] Pager [phone] Office [phone]

## 2014-09-30 NOTE — Progress Notes (Signed)
PT Cancellation Note  Patient Details Name: [name] [name] MRN: [id number] DOB: 1941/02/24   Cancelled Treatment:    Reason Eval/Treat Not Completed: Patient at procedure or test/unavailable   Attempted to see pt, however she was at stress test;  Will follow up later today as time allows;  Otherwise, will follow up for PT tomorrow;   Thank you,  Roney Marion, PT  Acute Rehabilitation Services Pager [phone] Office [phone]     Roney Marion [facility] 09/30/2014, 10:19 AM

## 2014-09-30 NOTE — Progress Notes (Signed)
  NST completed. Normal study with normal perfusion. Low risk test. EF 65%. Patient notified of results.   Lyda Jester 09/30/2014 2:34 PM

## 2014-09-30 NOTE — Progress Notes (Signed)
Utilization review complete. Houa Nie RN CCM Case Mgmt phone [phone] 

## 2014-10-01 LAB — CBC
HEMATOCRIT: 22.8 % — AB (ref 36.0–46.0)
HEMOGLOBIN: 7.3 g/dL — AB (ref 12.0–15.0)
MCH: 32.3 pg (ref 26.0–34.0)
MCHC: 32 g/dL (ref 30.0–36.0)
MCV: 100.9 fL — AB (ref 78.0–100.0)
Platelets: 148 10*3/uL — ABNORMAL LOW (ref 150–400)
RBC: 2.26 MIL/uL — ABNORMAL LOW (ref 3.87–5.11)
RDW: 14.9 % (ref 11.5–15.5)
WBC: 2.9 10*3/uL — ABNORMAL LOW (ref 4.0–10.5)

## 2014-10-01 LAB — POTASSIUM: Potassium: 4.6 mmol/L (ref 3.5–5.1)

## 2014-10-01 LAB — PREPARE RBC (CROSSMATCH)

## 2014-10-01 LAB — SAVE SMEAR

## 2014-10-01 LAB — BASIC METABOLIC PANEL
Anion gap: 11 (ref 5–15)
BUN: 46 mg/dL — ABNORMAL HIGH (ref 6–20)
CO2: 23 mmol/L (ref 22–32)
CREATININE: 3.64 mg/dL — AB (ref 0.44–1.00)
Calcium: 8.5 mg/dL — ABNORMAL LOW (ref 8.9–10.3)
Chloride: 110 mmol/L (ref 101–111)
GFR calc Af Amer: 13 mL/min — ABNORMAL LOW (ref >60)
GFR calc non Af Amer: 11 mL/min — ABNORMAL LOW (ref >60)
Glucose, Bld: 142 mg/dL — ABNORMAL HIGH (ref 65–99)
Potassium: 4.3 mmol/L (ref 3.5–5.1)
Sodium: 144 mmol/L (ref 135–145)

## 2014-10-01 LAB — OCCULT BLOOD X 1 CARD TO LAB, STOOL: FECAL OCCULT BLD: NEGATIVE

## 2014-10-01 LAB — MAGNESIUM: Magnesium: 2.2 mg/dL (ref 1.7–2.4)

## 2014-10-01 MED ORDER — FUROSEMIDE 10 MG/ML IJ SOLN
20.0000 mg | Freq: Once | INTRAMUSCULAR | Status: AC
  Administered 2014-10-01: 20 mg via INTRAVENOUS
  Filled 2014-10-01: qty 2

## 2014-10-01 MED ORDER — SODIUM CHLORIDE 0.9 % IV SOLN
Freq: Once | INTRAVENOUS | Status: AC
  Administered 2014-10-01: 14:00:00 via INTRAVENOUS

## 2014-10-01 MED ORDER — POLYETHYLENE GLYCOL 3350 17 G PO PACK
17.0000 g | PACK | Freq: Two times a day (BID) | ORAL | Status: DC
  Administered 2014-10-01 – 2014-10-02 (×2): 17 g via ORAL
  Filled 2014-10-01 (×19): qty 1

## 2014-10-01 MED ORDER — DOCUSATE SODIUM 100 MG PO CAPS
100.0000 mg | ORAL_CAPSULE | Freq: Two times a day (BID) | ORAL | Status: DC
  Administered 2014-10-01 – 2014-10-09 (×16): 100 mg via ORAL
  Filled 2014-10-01 (×19): qty 1

## 2014-10-01 NOTE — Progress Notes (Addendum)
Patient ID: [name] [name], female   DOB: 1940/07/07, 74 y.o.   MRN: [id number] [facility] PROGRESS NOTE  [name] [name] OHY:[id number] DOB: 16-Oct-1940 DOA: 09/28/2014 PCP: Neale Burly, MD  Brief narrative:    74 year old female with past medical history of chronic diastolic CHF, hypertrophy cardiomyopathy, extensive cardiac work up in past including right and left heart catheterization in 2010 (with 90% LAD lesion which was stented), COPD, hypertension, recent hospitalization in [facility] hospital for acute diastolic CHF where she underwent pretty aggressive diureses. Patient now presented to [facility] ED with intermittent chest tightness, cough ever since discharge from [facility].   On this admission, she was found to have acute renal failure with creatinine level in 4 range (her baseline is normal creatinine). Initial troponin was WNL. Cardio has seen in her consultation and plans for stress test. Of note, CXR on admission showed possible early pulmonary edema.    Assessment/Plan:    Chest pain - In pt with extensive cardiac history and work up, concern is always for ACS - Her troponin levels remain WNL - She will go for stress test today  - Continue aspirin, Lipitor and metoprolol  - Cardiology is following, appreciate their recommendations   Acute renal failure - Unclear etiology - Normal Cr is baseline value and on this admission 4.1 - cr stable at 3.6 IV fluids stopped.  -check SPEP, UPEP.  -Renal US; No evidence of hydronephrosis or bladder distention. Right nephrolithiasis.   Anemia, pancytopenia:  Patient denies melena, hematochezia. She had colonoscopy 2014.  Last hb 5 years ago was at 4. Anemia panel: normal B-12, iron ferritin.  Will check peripheral smear. UPEP, SPEP. Concern with MM in setting of renal failure.  Will Consult oncology.  Check for occult blood.  Will transfuse 2 PRBC.   Acute on chronic diastolic heart failure - Pt with history of  diastolic CHF - 2 D ECHO on this admission showed normal EF but septal hypertrophy - BNP 597 on this admission - Weight since admission, 77 kg --> 78.9 kg - Will follow up with cardio about CHF management. Pt currently not on lasix due to acute renal failure. - Continue daily weight and strict intake and output  -holding lasix due to renal failure.    Essential hypertension - Continue metoprolol 25 mg PO BID  Dyslipidemia - Continue statin therapy   DVT Prophylaxis  - Heprain subQ ordered   Code Status: Full.  Family Communication:  plan of care discussed with the patient Disposition Plan: Home once renal function, and anemia sable. Work up in process.   IV access:  Peripheral IV  Procedures and diagnostic studies:    Ct Chest Wo Contrast Oct 04, 2014 Patchy ground-glass airspace opacity with interlobular septal thickening and bronchial wall thickening most compatible with early pulmonary edema.  Moderate cardiomegaly.   Electronically Signed   By: Conchita Paris M.D.   On: 10-04-2014 10:42   US Renal Oct 04, 2014  Bilateral simple renal cysts. Right nephrolithiasis. Exam is otherwise normal. No evidence of hydronephrosis or bladder distention.   Electronically Signed   By: [place]   On: 2014/10/04 10:14   Nm Myocar Multi W/spect Tamela Oddi Motion / Ef 09/30/2014  Dg Chest Port 1 View 09/28/2014  1.  No acute cardiopulmonary disease. 2. Grossly unchanged bibasilar atelectasis/scar, left greater than right.     Medical Consultants:  Cardiology  Oncology.   Other Consultants:  None   IAnti-Infectives:      Sharif Rendell  A, MD  [facility] Pager [phone]  Time spent in minutes: 25 minutes  If 7PM-7AM, please contact night-coverage [email or web address] Password [facility] 10/01/2014, 11:51 AM   LOS: 2 days    HPI/Subjective: She is still complaining of nausea. She had a colonoscopy 2014. She denies any history of cancer. Denies significant weight loss.    Objective: Filed Vitals:   09/30/14 1715 09/30/14 2059 10/01/14 0454 10/01/14 1000  BP: 126/48 117/42 100/51 127/46  Pulse: 82 81 70 77  Temp: 98.4 F (36.9 C) 98.9 F (37.2 C) 98 F (36.7 C) 98.5 F (36.9 C)  TempSrc: Oral Oral Oral Oral  Resp: 17 18 17 18   Weight:   78.8 kg (173 lb 11.6 oz)   SpO2: 100% 96% 99% 98%    Intake/Output Summary (Last 24 hours) at 10/01/14 1151 Last data filed at 10/01/14 0900  Gross per 24 hour  Intake    480 ml  Output   1150 ml  Net   -670 ml    Exam:   General:  Pt is alert, follows commands appropriately, not in acute distress  Cardiovascular: Regular rate and rhythm, S1/S2 (+)  Respiratory: diminished breath sounds bilaterally, no wheezing, no crackles, no rhonchi  Abdomen: Soft, non tender, non distended, bowel sounds present  Extremities: No edema, pulses DP and PT palpable bilaterally  Neuro: Grossly nonfocal  Data Reviewed: Basic Metabolic Panel:  Recent Labs Lab 09/28/14 2217 09/29/14 0340 09/29/14 1214 09/30/14 0447  NA 139 142 141 142  K 4.1 4.5 4.2 4.6  CL 106 109 109 110  CO2 24 22 21* 22  GLUCOSE 153* 110* 142* 117*  BUN 45* 46* 45* 50*  CREATININE 4.10* 3.94* 3.82* 3.69*  CALCIUM 8.7* 8.6* 8.3* 8.4*  MG  --  2.2  --   --   PHOS  --  5.4*  --   --    Liver Function Tests:  Recent Labs Lab 09/29/14 0340  AST 20  ALT 23  ALKPHOS 76  BILITOT 0.8  PROT 6.0*  ALBUMIN 3.3*   No results for input(s): LIPASE, AMYLASE in the last 168 hours. No results for input(s): AMMONIA in the last 168 hours. CBC:  Recent Labs Lab 09/28/14 2217 09/29/14 0340 09/30/14 0447 10/01/14 0551  WBC 3.6* 3.2* 2.7* 2.9*  HGB 8.2* 8.1* 7.4* 7.3*  HCT 25.1* 24.5* 22.7* 22.8*  MCV 99.2 99.2 100.4* 100.9*  PLT 178 169 149* 148*   Cardiac Enzymes:  Recent Labs Lab 09/28/14 2332 09/29/14 0020 09/29/14 0340 09/29/14 1214 09/29/14 1818  TROPONINI 0.03 0.03 0.03 0.03 <0.03   BNP: Invalid input(s):  POCBNP CBG: No results for input(s): GLUCAP in the last 168 hours.  No results found for this or any previous visit (from the past 240 hour(s)).   Scheduled Meds: . aspirin EC  81 mg Oral Daily  . atorvastatin  80 mg Oral QHS  . doxycycline  100 mg Oral Q12H  . heparin  5,000 Units Subcutaneous 3 times per day  . metoprolol tartrate  25 mg Oral BID  . pantoprazole  40 mg Oral Q1200

## 2014-10-01 NOTE — Progress Notes (Signed)
Patient Name: [name] [name] Date of Encounter: 10/01/2014  Primary Cardiologist: Dr. Jeronimo Greaves. [place]   Principal Problem:   Acute renal failure Active Problems:   DYSPNEA   Diastolic heart failure   Chest pain   Anemia   Chronic diastolic heart failure   Acute on chronic combined systolic and diastolic heart failure   Essential hypertension    SUBJECTIVE  No more chest pain. Nauseated this morning. Denies any SOB. However has cough.  CURRENT MEDS . aspirin EC  81 mg Oral Daily  . atorvastatin  80 mg Oral QHS  . guaiFENesin  600 mg Oral BID  . heparin  5,000 Units Subcutaneous 3 times per day  . latanoprost  1 drop Both Eyes QHS  . metoprolol tartrate  25 mg Oral BID  . pantoprazole  40 mg Oral Q1200  . regadenoson  0.4 mg Intravenous Once  . sodium chloride  3 mL Intravenous Q12H    OBJECTIVE  Filed Vitals:   09/30/14 1715 09/30/14 2059 10/01/14 0454 10/01/14 1000  BP: 126/48 117/42 100/51 127/46  Pulse: 82 81 70 77  Temp: 98.4 F (36.9 C) 98.9 F (37.2 C) 98 F (36.7 C) 98.5 F (36.9 C)  TempSrc: Oral Oral Oral Oral  Resp: 17 18 17 18   Weight:   173 lb 11.6 oz (78.8 kg)   SpO2: 100% 96% 99% 98%    Intake/Output Summary (Last 24 hours) at 10/01/14 1101 Last data filed at 10/01/14 0900  Gross per 24 hour  Intake    480 ml  Output   1150 ml  Net   -670 ml   Filed Weights   09/28/14 2145 09/29/14 2159 10/01/14 0454  Weight: 169 lb 12.8 oz (77.021 kg) 174 lb (78.926 kg) 173 lb 11.6 oz (78.8 kg)    PHYSICAL EXAM  General: Pleasant, NAD. Neuro: Alert and oriented X 3. Moves all extremities spontaneously. Psych: Normal affect. HEENT:  Normal  Neck: Supple without bruits or JVD. Lungs:  Resp regular and unlabored, CTA. Heart: RRR no s3, s4. 2/6 systolic murmur at apex Abdomen: Soft, non-tender, non-distended, BS + x 4.  Extremities: No clubbing, cyanosis or edema. DP/PT/Radials 2+ and equal bilaterally.  Accessory Clinical  Findings  CBC  Recent Labs  09/30/14 0447 10/01/14 0551  WBC 2.7* 2.9*  HGB 7.4* 7.3*  HCT 22.7* 22.8*  MCV 100.4* 100.9*  PLT 149* 564*   Basic Metabolic Panel  Recent Labs  09/29/14 0340 09/29/14 1214 09/30/14 0447  NA 142 141 142  K 4.5 4.2 4.6  CL 109 109 110  CO2 22 21* 22  GLUCOSE 110* 142* 117*  BUN 46* 45* 50*  CREATININE 3.94* 3.82* 3.69*  CALCIUM 8.6* 8.3* 8.4*  MG 2.2  --   --   PHOS 5.4*  --   --    Liver Function Tests  Recent Labs  09/29/14 0340  AST 20  ALT 23  ALKPHOS 76  BILITOT 0.8  PROT 6.0*  ALBUMIN 3.3*   Cardiac Enzymes  Recent Labs  09/29/14 0340 09/29/14 1214 09/29/14 1818  TROPONINI 0.03 0.03 <0.03   Hemoglobin A1C  Recent Labs  09/29/14 0340  HGBA1C 6.3*   Thyroid Function Tests  Recent Labs  09/29/14 0340  TSH 3.099    ECG  No new EKG  Echocardiogram  LV EF: 65% -  70%  ------------------------------------------------------------------- Indications:   Dyspnea 786.09.  ------------------------------------------------------------------- History:  PMH:  Chest pain. Risk factors: Hypertension.  ------------------------------------------------------------------- Study Conclusions  -  Left ventricle: The cavity size was normal. There was severe focal basal and moderate concentric hypertrophy of the left ventricle. Systolic function was vigorous. The estimated ejection fraction was in the range of 65% to 70%. Wall motion was normal; there were no regional wall motion abnormalities. Features are consistent with a pseudonormal left ventricular filling pattern, with concomitant abnormal relaxation and increased filling pressure (grade 2 diastolic dysfunction). Doppler parameters are consistent with elevated ventricular end-diastolic filling pressure. - Mitral valve: Calcified annulus. Moderately thickened, moderately calcified leaflets . The findings are consistent with  moderate stenosis. There was moderate regurgitation directed centrally. - Left atrium: The atrium was moderately dilated. - Right atrium: The atrium was moderately dilated. - Tricuspid valve: There was moderate-severe regurgitation. - Pulmonary arteries: PA peak pressure: 68 mm Hg (S).  Impressions:  - There is severe basal septal hypertrophy. LVEF is hyperdanamic. There is chordal systolic anterior motion of the mitral valve. LVOT gradient is 28 mmHg at rest and 128 mmHg with Valsalva maneuver. Findings are consistent with hypertrophic cardiomyopathy. CMR is recommended for risk stratification.    Radiology/Studies  Ct Chest Wo Contrast  09/29/2014   CLINICAL DATA:  Shortness of breath and nonproductive cough  EXAM: CT CHEST WITHOUT CONTRAST  TECHNIQUE: Multidetector CT imaging of the chest was performed following the standard protocol without IV contrast.  COMPARISON:  Chest radiograph 09/28/2014, thoracic MRI 05/28/2014  FINDINGS: Mediastinum/Nodes: Moderate atheromatous aortic and coronary arterial calcification without aneurysm. Moderate cardiac enlargement is noted. No pericardial effusion. No lymphadenopathy. Probable vascular at ectasia at the level of the right brachiocephalic artery/subclavian takeoff image 4.  Lungs/Pleura: Areas of patchy ground-glass airspace opacity are identified with interlobular septal thickening and central bronchial wall thickening. Central airways are patent. Trace pleural fluid.  Upper abdomen: The upper abdominal viscera unremarkable where visualized.  Musculoskeletal: T10 vertebra plana reidentified. No new acute osseous abnormality. The bones are subjectively osteopenic.  IMPRESSION: Patchy ground-glass airspace opacity with interlobular septal thickening and bronchial wall thickening most compatible with early pulmonary edema.  Moderate cardiomegaly.   Electronically Signed   By: Conchita Paris M.D.   On: 09/29/2014 10:42   US  Renal  09/29/2014   CLINICAL DATA:  Acute renal failure.  EXAM: RENAL / URINARY TRACT ULTRASOUND COMPLETE  COMPARISON:  None.  FINDINGS: Right Kidney:  Length: 11.8 cm. Echogenicity within normal limits. No hydronephrosis visualized. 8 mm cyst right upper renal pole. Right nephrolithiasis noted.  Left Kidney:  Length: 12.1 cm. Echogenicity within normal limits. No hydronephrosis visualized. 8 mm cyst left lower renal pole.  Bladder:  Appears normal for degree of bladder distention.  IMPRESSION: Bilateral simple renal cysts. Right nephrolithiasis. Exam is otherwise normal. No evidence of hydronephrosis or bladder distention.   Electronically Signed   By: Marcello Moores  Register   On: 09/29/2014 10:14   Nm Myocar Multi W/spect W/wall Motion / Ef  09/30/2014    The study is normal.  This is a low risk study.  The left ventricular ejection fraction is hyperdynamic (>65%).  There was no ST segment deviation noted during stress.   Normal Lexiscan myovue with no ischemia or infarction EF 69%   Dg Chest Port 1 View  09/28/2014   CLINICAL DATA:  Shortness of breath and chest pressure with dry cough for the past 3 weeks. Recent diagnosis of congestive heart failure.  EXAM: PORTABLE CHEST - 1 VIEW  COMPARISON:  09/23/2014; 09/22/2012  FINDINGS: Grossly unchanged enlarged cardiac silhouette and mediastinal contours with partially calcified  right suprahilar lymph nodes. Grossly unchanged bibasilar heterogeneous opacities, left greater than right, likely atelectasis or scar. No new focal airspace opacities. No pleural effusion or pneumothorax. No evidence of edema. No acute osseus abnormalities.  IMPRESSION: 1.  No acute cardiopulmonary disease. 2. Grossly unchanged bibasilar atelectasis/scar, left greater than right.   Electronically Signed   By: Sandi Mariscal M.D.   On: 09/28/2014 22:15    ASSESSMENT AND PLAN  74 yo female with h/o obesity, HTN, diastolic HF, hypertrophic CM, CAD and COPD present to [facility] on 09/29/2014 with  coughing and intermittent chest tightness. Had significant jump in Cr after being discharged from [facility] for HF on 5/19. Cr increased from 1.11 on discharge to 4.1 on arrival.   1. Chest pain  - Echo shows preserved EF, grade 2 diastolic dysfunction  - low risk stress test 09/30/2014  - no further cardiac workup, outpatient followup already arranged  - treat anemia, with severe hypertrophic cardiomyopathy, should be evaluated by Dr. Omar Person as outpatient once renal function improve to assess if a candidate for [facility] ablation vs myomectomy if her sx persist  - some bursts of SVT on telemetry overnight, currently on 25mg  BID metoprolol, BP is not ideal to uptitrate. Would continue to monitor for now.  2. ARF: likely related to overdiuresis  - s/p IV hydration with some improvement in Cr  3. Chronic diastolic HF  4. Anemia: hgb 7.3. Consider iron therapy, improving hgb may help with symptom  5. Moderate MR on echo 6. Moderate to severe TR on echo 7. Severe hypertrophic cardiomyopathy  Signed, Almyra Deforest PA-C Pager: [phone] `   Attending Note:   The patient was seen and examined.  Agree with assessment and plan as noted above.  Changes made to the above note as needed.  Pt is slowly improving.  BMP is not back yet - has been ordered.  She has HOCM with a dynamic outflow gradient. This obstruction would definitely worsen if she were to become volume depleted - which is what I think happened here Would avoid vasodilators if possible and use beta blockers and diltiazem for her HTN if needed.  - both of which can help reduce the dynamic obstruction.   She needs to be transfused.   Thayer Headings, Brooke Bonito., MD, [employer] 10/01/2014, 12:36 PM [address]  [place] Pager [phone]

## 2014-10-01 NOTE — Progress Notes (Signed)
While at the bedside of this patient, this RN received an alert from [employer] of a vtach incident. Called Central Telemetry to confirm. Central Telemetry verified 8 beat run of vtach. Patient denied any discomfort. VSS. Pt was about 11 minutes into 2nd blood transfusion. Patient was able to ambulate to bathroom without difficulty. MD notified. Orders received to check potassium and magnesium 1 hour post transfusion. Will continue to monitor. Bartholomew Crews, RN

## 2014-10-02 ENCOUNTER — Telehealth: Payer: Self-pay | Admitting: *Deleted

## 2014-10-02 ENCOUNTER — Inpatient Hospital Stay (HOSPITAL_COMMUNITY): Payer: Medicare Other

## 2014-10-02 DIAGNOSIS — D630 Anemia in neoplastic disease: Secondary | ICD-10-CM

## 2014-10-02 DIAGNOSIS — R05 Cough: Secondary | ICD-10-CM

## 2014-10-02 DIAGNOSIS — I421 Obstructive hypertrophic cardiomyopathy: Secondary | ICD-10-CM

## 2014-10-02 DIAGNOSIS — R0600 Dyspnea, unspecified: Secondary | ICD-10-CM

## 2014-10-02 DIAGNOSIS — D696 Thrombocytopenia, unspecified: Secondary | ICD-10-CM

## 2014-10-02 DIAGNOSIS — N189 Chronic kidney disease, unspecified: Secondary | ICD-10-CM

## 2014-10-02 DIAGNOSIS — I429 Cardiomyopathy, unspecified: Secondary | ICD-10-CM

## 2014-10-02 DIAGNOSIS — D72819 Decreased white blood cell count, unspecified: Secondary | ICD-10-CM

## 2014-10-02 DIAGNOSIS — R079 Chest pain, unspecified: Secondary | ICD-10-CM

## 2014-10-02 LAB — PROTEIN ELECTROPHORESIS, SERUM
A/G Ratio: 1.1 (ref 0.7–2.0)
ALPHA-2-GLOBULIN: 1 g/dL (ref 0.4–1.2)
Albumin ELP: 3.3 g/dL (ref 3.2–5.6)
Alpha-1-Globulin: 0.3 g/dL (ref 0.1–0.4)
Beta Globulin: 1 g/dL (ref 0.6–1.3)
GLOBULIN, TOTAL: 3 g/dL (ref 2.0–4.5)
Gamma Globulin: 0.6 g/dL (ref 0.5–1.6)
M-Spike, %: 0.3 g/dL — ABNORMAL HIGH
Total Protein ELP: 6.3 g/dL (ref 6.0–8.5)

## 2014-10-02 LAB — CBC
HCT: 32.8 % — ABNORMAL LOW (ref 36.0–46.0)
Hemoglobin: 10.8 g/dL — ABNORMAL LOW (ref 12.0–15.0)
MCH: 32.1 pg (ref 26.0–34.0)
MCHC: 32.9 g/dL (ref 30.0–36.0)
MCV: 97.6 fL (ref 78.0–100.0)
Platelets: 147 10*3/uL — ABNORMAL LOW (ref 150–400)
RBC: 3.36 MIL/uL — AB (ref 3.87–5.11)
RDW: 16.4 % — ABNORMAL HIGH (ref 11.5–15.5)
WBC: 4 10*3/uL (ref 4.0–10.5)

## 2014-10-02 LAB — TYPE AND SCREEN
ABO/RH(D): O POS
ANTIBODY SCREEN: NEGATIVE
UNIT DIVISION: 0
Unit division: 0

## 2014-10-02 LAB — BASIC METABOLIC PANEL
ANION GAP: 10 (ref 5–15)
BUN: 45 mg/dL — ABNORMAL HIGH (ref 6–20)
CO2: 23 mmol/L (ref 22–32)
Calcium: 8.6 mg/dL — ABNORMAL LOW (ref 8.9–10.3)
Chloride: 109 mmol/L (ref 101–111)
Creatinine, Ser: 3.74 mg/dL — ABNORMAL HIGH (ref 0.44–1.00)
GFR calc Af Amer: 13 mL/min — ABNORMAL LOW (ref >60)
GFR, EST NON AFRICAN AMERICAN: 11 mL/min — AB (ref >60)
Glucose, Bld: 121 mg/dL — ABNORMAL HIGH (ref 65–99)
POTASSIUM: 4.6 mmol/L (ref 3.5–5.1)
SODIUM: 142 mmol/L (ref 135–145)

## 2014-10-02 LAB — SAVE SMEAR

## 2014-10-02 LAB — OCCULT BLOOD X 1 CARD TO LAB, STOOL: Fecal Occult Bld: NEGATIVE

## 2014-10-02 MED ORDER — METOPROLOL TARTRATE 50 MG PO TABS
50.0000 mg | ORAL_TABLET | Freq: Two times a day (BID) | ORAL | Status: DC
  Administered 2014-10-02 – 2014-10-10 (×14): 50 mg via ORAL
  Filled 2014-10-02 (×17): qty 1

## 2014-10-02 NOTE — Telephone Encounter (Signed)
Dr. Pennie Banter called reporting patient in room [place] room 9 at [facility] needs consult for Pancytopenia r/o multiple myeloma.  H.I.M. Notified.

## 2014-10-02 NOTE — Consult Note (Signed)
[facility] [place]  Telephone:([phone]) [place] NOTE  [name] [name]                                MR#: [id number]  DOB: [birth year]                       CSN#: [id number]  Referring MD: [facility]     Patient Care Team: Neale Burly, MD as PCP - General (Internal Medicine)  Reason for Consult: Pancytopenia   [name] is 73 y.o.female admitted on 5/23 with chest pain and non productive cough in the setting of chronic dyspnea via ED, following a recent hospitalization at [facility] from 5/17-5/19 for CHF exacerbation. Denies gum bleed,epistaxis or hemoptysis. Denies easy bruising. Denies fevers,chills or  night sweats. Denies weight loss or decreased appetite. She uses baby ASA daily and occasional NSAIDs. No Lovenox was used in prior hospitalization, and in this one she was placed on heparin per pharmacy. Family history remarkable for father dying with multiple myeloma.  Patient had never been by a hematologist. Never had a transfusion in the past or had a bone marrow biopsy. CT of the chest was negative for PE, but did show mild pulmonary edema.  She had renal failure as well.  Her CBC on admission showed a Hb 8.2, dropping to 7.7 on 5/23 requiring 2 units of blood with good response, today at 10.8. Hemoccults was negative. (last colonoscopy was in 2014, normal) MCV is normal now, was up to 100.9 on 5/25. Her WBC was initially 3.6, droppint to 2.9 yesterday, today normal at 4.0,  and platelets are essentially unremarkable at 147k. Smear was ordered for review to rule out underlying bone marrow process. SPEP and UPEP are pending. Retic is normal at 24.2.Marland Kitchen Iron studies with Folic Acid were normal. B12 was 576, normal We were kindly requested to consult to help in the diagnosis and management of hematological abnormalities.  PMH:  Past Medical History  Diagnosis Date  . HTN (hypertension)   . Hyperlipidemia   . Hypertrophic  cardiomyopathy     Mild hypertrophic CM without outflow tract obstruction -- ETT/Myoview EF 74%. no ischemia (12/2006)  --stress echo 8/10: normal BP response to exercise. no presyncope  no wall motion abnormalties. No significant LVOT obstruction. non-diagnositc ST depression  --holter monitor normal   . CAD (coronary artery disease)     --s/p DES LAD and POBA diagonal 11/10  --RHC cath normal  . COPD (chronic obstructive pulmonary disease)   . Obesity   . Pre-syncope   . CHF (congestive heart failure)     Surgeries:  History reviewed. No pertinent past surgical history.  Allergies:  Allergies  Allergen Reactions  . Morphine Shortness Of Breath    Eyes became bloodshot  . Penicillins Rash    Medications:   Prior to Admission:  Prescriptions prior to admission  Medication Sig Dispense Refill Last Dose  . amLODipine (NORVASC) 5 MG tablet Take 5 mg by mouth daily.   09/28/2014 at Unknown time  . aspirin 81 MG EC tablet Take 81 mg by mouth daily.     09/28/2014 at Unknown time  . atorvastatin (LIPITOR) 80 MG tablet Take 80 mg by mouth at bedtime.    09/27/2014 at Unknown time  . [EXPIRED] azithromycin (ZITHROMAX) 250 MG tablet Take 250 mg by mouth daily. z-pak-Started 09/26/14  x5 days   09/28/2014 at Unknown time  . Calcium Carbonate-Vitamin D (CALCIUM 600 + D PO) Take 600 mg by mouth daily.   09/28/2014 at Unknown time  . denosumab (PROLIA) 60 MG/ML SOLN injection Inject 60 mg into the skin every 6 (six) months. Administer in upper arm, thigh, or abdomen   Past Month at Unknown time  . furosemide (LASIX) 40 MG tablet Take 40 mg by mouth daily.   09/28/2014 at Unknown time  . latanoprost (XALATAN) 0.005 % ophthalmic solution Place 1 drop into both eyes at bedtime.     Marland Kitchen losartan (COZAAR) 100 MG tablet Take 100 mg by mouth daily.     09/28/2014 at Unknown time  . metoprolol tartrate (LOPRESSOR) 25 MG tablet TAKE 1 TABLET BY MOUTH TWO TIMES A DAY 180 tablet 3 09/28/2014 at 0830    Scheduled  Meds: . aspirin EC  81 mg Oral Daily  . atorvastatin  80 mg Oral QHS  . docusate sodium  100 mg Oral BID  . guaiFENesin  600 mg Oral BID  . heparin  5,000 Units Subcutaneous 3 times per day  . latanoprost  1 drop Both Eyes QHS  . metoprolol tartrate  50 mg Oral BID  . pantoprazole  40 mg Oral Q1200  . polyethylene glycol  17 g Oral BID  . regadenoson  0.4 mg Intravenous Once  . sodium chloride  3 mL Intravenous Q12H   Continuous Infusions:  PRN Meds:.acetaminophen **OR** acetaminophen, albuterol, guaiFENesin-dextromethorphan, ondansetron (ZOFRAN) IV  ROS: Constitutional: Denies fevers, chills or abnormal night sweats Eyes: Denies blurriness of vision, double vision or watery eyes Ears, nose, mouth, throat, and face: Denies mucositis or sore throat Respiratory: Improved cough, dyspnea controlled . Cardiovascular: Denies palpitation, chest discomfort or lower extremity swelling at this time Gastrointestinal: Reports intermittent, mild nausea without vomiting, denies heartburn or change in bowel habits Skin: Denies abnormal skin rashes Lymphatics: Denies new lymphadenopathy or easy bruising Neurological:Denies numbness, tingling or new weaknesses Behavioral/Psych: Mood is stable, no new changes  All other systems were reviewed with the patient and are negative.   Family History:    Family History  Problem Relation Age of Onset  . CAD Mother   . Multiple myeloma Father   . Diabetes type II Sister   . CAD Brother   . Stroke Sister   . Uterine cancer Sister     Social History:  reports that she quit smoking about 14 years ago. Her smoking use included Cigarettes. She does not have any smokeless tobacco history on file. She reports that she does not drink alcohol or use illicit drugs. 2 sons in good health.  Physical Exam    Filed Vitals:   10/02/14 1000  BP: 121/57  Pulse: 77  Temp: 99.1 F (37.3 C)  Resp: 18   Filed Weights   09/29/14 2159 10/01/14 0454 10/02/14  0603  Weight: 174 lb (78.926 kg) 173 lb 11.6 oz (78.8 kg) 173 lb 15.1 oz (78.9 kg)    GENERAL:alert, no distress and comfortable SKIN: skin color, texture, turgor are normal, no rashes or significant lesions EYES: normal, conjunctiva are pink and non-injected, sclera clear OROPHARYNX:no exudate, no erythema and lips, buccal mucosa, and tongue normal  NECK: supple, thyroid normal size, non-tender, without nodularity LYMPH:  no palpable lymphadenopathy in the cervical, axillary or inguinal area LUNGS: decreased breath sounds at the bases with normal breathing effort HEART: regular rate & rhythm and no murmurs and no lower extremity edema ABDOMEN  soft, non-tender and normal bowel sounds Musculoskeletal:no cyanosis of digits and no clubbing. Mildly tender at the right costal area.  PSYCH: alert & oriented x 3 with fluent speech NEURO: no focal motor/sensory deficits  Labs:    Recent Labs Lab 09/28/14 2217 09/29/14 0340 09/30/14 0447 10/01/14 0551 10/02/14 0747  WBC 3.6* 3.2* 2.7* 2.9* 4.0  HGB 8.2* 8.1* 7.4* 7.3* 10.8*  HCT 25.1* 24.5* 22.7* 22.8* 32.8*  PLT 178 169 149* 148* 147*  MCV 99.2 99.2 100.4* 100.9* 97.6  MCH 32.4 32.8 32.7 32.3 32.1  MCHC 32.7 33.1 32.6 32.0 32.9  RDW 14.4 14.5 14.7 14.9 16.4*       Recent Labs Lab 09/29/14 0340 09/29/14 1214 09/30/14 0447 10/01/14 1300 10/01/14 2013 10/02/14 0747  NA 142 141 142 144  --  142  K 4.5 4.2 4.6 4.3 4.6 4.6  CL 109 109 110 110  --  109  CO2 22 21* 22 23  --  23  GLUCOSE 110* 142* 117* 142*  --  121*  BUN 46* 45* 50* 46*  --  45*  CREATININE 3.94* 3.82* 3.69* 3.64*  --  3.74*  CALCIUM 8.6* 8.3* 8.4* 8.5*  --  8.6*  MG 2.2  --   --   --  2.2  --   AST 20  --   --   --   --   --   ALT 23  --   --   --   --   --   ALKPHOS 76  --   --   --   --   --   BILITOT 0.8  --   --   --   --   --         Component Value Date/Time   BILITOT 0.8 09/29/2014 0340     No results for input(s): INR, PROTIME in the  last 168 hours.  No results for input(s): DDIMER in the last 72 hours.   Anemia panel: Iron/TIBC/Ferritin/ %Sat    Component Value Date/Time   IRON 59 09/29/2014 0020   TIBC 312 09/29/2014 0020   FERRITIN 154 09/29/2014 0020   IRONPCTSAT 19 09/29/2014 0020   Urinalysis    Component Value Date/Time   COLORURINE YELLOW 09/29/2014 0400   APPEARANCEUR CLEAR 09/29/2014 0400   LABSPEC 1.019 09/29/2014 0400   PHURINE 5.5 09/29/2014 0400   GLUCOSEU NEGATIVE 09/29/2014 0400   HGBUR SMALL* 09/29/2014 0400   BILIRUBINUR NEGATIVE 09/29/2014 0400   KETONESUR NEGATIVE 09/29/2014 0400   PROTEINUR 30* 09/29/2014 0400   UROBILINOGEN 0.2 09/29/2014 0400   NITRITE NEGATIVE 09/29/2014 0400   LEUKOCYTESUR NEGATIVE 09/29/2014 0400    Drugs of Abuse  No results found for: LABOPIA, COCAINSCRNUR, LABBENZ, AMPHETMU, THCU, LABBARB    Imaging Studies:  Ct Chest Wo Contrast  09/29/2014   CLINICAL DATA:  Shortness of breath and nonproductive cough  EXAM: CT CHEST WITHOUT CONTRAST  TECHNIQUE: Multidetector CT imaging of the chest was performed following the standard protocol without IV contrast.  COMPARISON:  Chest radiograph 09/28/2014, thoracic MRI 05/28/2014  FINDINGS: Mediastinum/Nodes: Moderate atheromatous aortic and coronary arterial calcification without aneurysm. Moderate cardiac enlargement is noted. No pericardial effusion. No lymphadenopathy. Probable vascular at ectasia at the level of the right brachiocephalic artery/subclavian takeoff image 4.  Lungs/Pleura: Areas of patchy ground-glass airspace opacity are identified with interlobular septal thickening and central bronchial wall thickening. Central airways are patent. Trace pleural fluid.  Upper abdomen: The upper abdominal viscera unremarkable where  visualized.  Musculoskeletal: T10 vertebra plana reidentified. No new acute osseous abnormality. The bones are subjectively osteopenic.  IMPRESSION: Patchy ground-glass airspace opacity with  interlobular septal thickening and bronchial wall thickening most compatible with early pulmonary edema.  Moderate cardiomegaly.   Electronically Signed   By: Conchita Paris M.D.   On: 09/29/2014 10:42   US Renal  09/29/2014   CLINICAL DATA:  Acute renal failure.  EXAM: RENAL / URINARY TRACT ULTRASOUND COMPLETE  COMPARISON:  None.  FINDINGS: Right Kidney:  Length: 11.8 cm. Echogenicity within normal limits. No hydronephrosis visualized. 8 mm cyst right upper renal pole. Right nephrolithiasis noted.  Left Kidney:  Length: 12.1 cm. Echogenicity within normal limits. No hydronephrosis visualized. 8 mm cyst left lower renal pole.  Bladder:  Appears normal for degree of bladder distention.  IMPRESSION: Bilateral simple renal cysts. Right nephrolithiasis. Exam is otherwise normal. No evidence of hydronephrosis or bladder distention.   Electronically Signed   By: Marcello Moores  Register   On: 09/29/2014 10:14   Nm Myocar Multi W/spect W/wall Motion / Ef  09/30/2014    The study is normal.  This is a low risk study.  The left ventricular ejection fraction is hyperdynamic (>65%).  There was no ST segment deviation noted during stress.   Normal Lexiscan myovue with no ischemia or infarction EF 69%   Dg Chest Port 1 View  09/28/2014   CLINICAL DATA:  Shortness of breath and chest pressure with dry cough for the past 3 weeks. Recent diagnosis of congestive heart failure.  EXAM: PORTABLE CHEST - 1 VIEW  COMPARISON:  09/23/2014; 09/22/2012  FINDINGS: Grossly unchanged enlarged cardiac silhouette and mediastinal contours with partially calcified right suprahilar lymph nodes. Grossly unchanged bibasilar heterogeneous opacities, left greater than right, likely atelectasis or scar. No new focal airspace opacities. No pleural effusion or pneumothorax. No evidence of edema. No acute osseus abnormalities.  IMPRESSION: 1.  No acute cardiopulmonary disease. 2. Grossly unchanged bibasilar atelectasis/scar, left greater than right.    Electronically Signed   By: Sandi Mariscal M.D.   On: 09/28/2014 22:15    Assessment and Plan 74 y.o. admitted with chest pain and non productive cough in the setting of chronic dyspnea via ED, following a recent hospitalization at [facility] from 5/17-5/19 for CHF exacerbation. Her CBC was remarkable for acute pancytopenia. Her WBC and platelets recoverd. She continues to have anemia, likely acute on chronic.  As such, patient's diagnosis is not consistent with pancytopenia picture. Pertinent labs and smear are pending to rule out a bone marrow process:  Anemia in chronic disease In the setting of CHF, CKD, Hypertrophic cardiomyopathy, recent hospitalization, antibiotics and dilution No bleeding issues reported. She received 2 units of blood for symptomatic anemia on 5/25, current Hb at 10.8 Iron studies with B12 and FA are normal. Hemoccults is negative Smear was sent for review.  UPEP and SPEP are pending.  Monitor counts closely. Would check Epo levels Transfuse blood to maintain a Hb of 8 g or if the patient is acutely bleeding  Thrombocytopenia This was acute, in the setting of ACS, IV diuresis and Protonix Current value is at 147k No bleeding issues reported.  Smear sent for review. Monitor counts closely No transfusion is indicated at this time Transfuse 1 unit of platelets if count is less or equal than 10,000 or 20,000 if the patient is acutely bleeding Hold heparin if platelets drop to less than 50,000   Leukopenia Likely acute, now normalized. She is afebrile.  Continue to closely monitor  Full Code   Other medical issues including CHF. ICM, COPD as per admitting team and speciialties.    **Disclaimer: This note was dictated with voice recognition software. Similar sounding words can inadvertently be transcribed and this note may contain transcription errors which may not have been corrected upon publication of note.Sharene Butters E, PA-C 10/02/2014 2:20 PM   Patient  seen and examined please see full note above. Pleasant 74 year old woman of the [place] presented with acute renal failure and worsening anemia. She did have mild leukocytopenia and thrombocytopenia which have actually resolved at this time. She has reported some back pain and flank pain which could be arthritic in nature.  Her peripheral smear was personally reviewed today and showed no evidence of schistocytes or red cell fragments. I could not appreciate any of rouleaux formation. Her white cells appeared normal without any dysplasia or immaturity.  Her serum protein electrophoresis is currently pending.  Impression and recommendation: 74 year old woman with acute renal failure and anemia. Plasma cell disorder certainly a possibility and her serum protein electrophoresis is pending. Her total protein is not elevated and her calcium is normal.  It is certainly possible that she has renal failure for other reasons and her anemia is secondary to her renal failure rather than than plasma cell disorder.  I recommend awaiting the results of her SPEP and obtaining a skeletal survey at her SPEP is abnormal.  Further recommendations to follow depending on the results of her SPEP.  Otherwise cell count and platelet count are reasonably within normal range and any abnormalities but really not hematological.  [name] Bouvier MD 10/02/2014

## 2014-10-02 NOTE — Progress Notes (Signed)
Chaplain respond to nurse [facility] request.  Patient was out of the room, spoke with pastor and best friend.  They requested that I check-in on her tomorrow.   10/02/14 1500  Clinical Encounter Type  Visited With Family;Health care provider  Visit Type Spiritual support  Referral From Nurse

## 2014-10-02 NOTE — Consult Note (Signed)
Requesting Physician:  Dr. Tyrell Antonio Reason for Consult:  AKI HPI: The patient is a 74 y.o. year-old woman with a background f HTN, obesity, diastolic CHF, hypertrophic CM, CAD (s/p PTA and stent of LAD lesion 2010), COPD, compression fractures. She was hospitalized at [facility] 5/17-5/19 with SOB, felt due to an exacerbation of CHF. Was on losartan, and notes indicate received IV lasix. Only creatinine in records sent is from day of admission there which was 1.11 (There are no other prior creatinines available to me other than from 2010).   She came back to the ED on 5/21 "feeling no better" in terms of her cough, and SOB. Creatinine was up to 4.10. Diuretics and losartan were stopped and she was given some slow IVF (NS 50 cc/hour) which have since been discontinued due to early edema on CXR. Creatinine has improved just slightly since admission, 3.74 today.  Urinalysis is bland, with 30 mg% protein, She is receiving no other nephrotoxic medications.  Cardiology increased metoprolol to 50 mg BID because of her severe hypertrophic CM and BP's are in low 100's. Has not received any contrast that I can see.  Renal ultrasound on admission 5/23 showed 11.8 and 12.1 cm kidneys with a small cyst in each kidney, normal echogenicity and "right nephrolithiasis". She has been non-oliguric (UOP has varied [phone] for any given 24 hour period.  Hematology has been consulted for evaluation of anemia (required 2 units of PRBC's this admission) and have ordered SPEP and UPEP. Their review of smear demonstrated no schistocytes. She is mildly thrombocytopenic (147K) and leukopenic (nadir 2700 today up to 4000)  CREATININE, SER  Date/Time Value Ref Range Status  10/02/2014 07:47 AM 3.74* 0.44 - 1.00 mg/dL Final  10/01/2014 01:00 PM 3.64* 0.44 - 1.00 mg/dL Final  09/30/2014 04:47 AM 3.69* 0.44 - 1.00 mg/dL Final  09/29/2014 12:14 PM 3.82* 0.44 - 1.00 mg/dL Final  09/29/2014 03:40 AM 3.94* 0.44 - 1.00 mg/dL Final   09/28/2014 10:17 PM 4.10* 0.44 - 1.00 mg/dL Final  09/23/14 1.11  [facility]) - admitted with acute CHF, diuresed with IV lasix while on losartan    03/17/2009 04:25 AM 1.08 0.4 - 1.2 mg/dL Final  03/09/2009 12:17 PM 0.9 0.4-1.2 mg/dL Final   Denies use of NSAIDS No FH of renal ds (but + FH myeloma) Lasix and azithromycin were her only new meds at d/c from [facility]  Past Medical History  Diagnosis Date  . HTN (hypertension)   . Hyperlipidemia   . Hypertrophic cardiomyopathy     Mild hypertrophic CM without outflow tract obstruction -- ETT/Myoview EF 74%. no ischemia (12/2006)  --stress echo 8/10: normal BP response to exercise. no presyncope  no wall motion abnormalties. No significant LVOT obstruction. non-diagnositc ST depression  --holter monitor normal   . CAD (coronary artery disease)     --s/p DES LAD and POBA diagonal 11/10  --RHC cath normal  . COPD (chronic obstructive pulmonary disease)   . Obesity   . Pre-syncope   . CHF (congestive heart failure)     Past Surgical History: History reviewed. No pertinent past surgical history.  Family History:  Family History  Problem Relation Age of Onset  . CAD Mother   . Multiple myeloma Father   . Diabetes type II Sister   . CAD Brother   . Stroke Sister   . Uterine cancer Sister    Social History:  reports that she quit smoking about 14 years ago. Her smoking use included Cigarettes.  She does not have any smokeless tobacco history on file. She reports that she does not drink alcohol or use illicit drugs.  Allergies  Allergen Reactions  . Morphine Shortness Of Breath    Eyes became bloodshot  . Penicillins Rash    Home medications: Prior to Admission medications   Medication Sig Start Date End Date Taking? Authorizing Provider  amLODipine (NORVASC) 5 MG tablet Take 5 mg by mouth daily.   Yes Historical Provider, MD  aspirin 81 MG EC tablet Take 81 mg by mouth daily.     Yes Historical Provider, MD  atorvastatin  (LIPITOR) 80 MG tablet Take 80 mg by mouth at bedtime.    Yes Historical Provider, MD  Calcium Carbonate-Vitamin D (CALCIUM 600 + D PO) Take 600 mg by mouth daily.   Yes Historical Provider, MD  denosumab (PROLIA) 60 MG/ML SOLN injection Inject 60 mg into the skin every 6 (six) months. Administer in upper arm, thigh, or abdomen   Yes Historical Provider, MD  furosemide (LASIX) 40 MG tablet Take 40 mg by mouth daily.   Yes Historical Provider, MD  latanoprost (XALATAN) 0.005 % ophthalmic solution Place 1 drop into both eyes at bedtime.   Yes Historical Provider, MD  losartan (COZAAR) 100 MG tablet Take 100 mg by mouth daily.     Yes Historical Provider, MD  metoprolol tartrate (LOPRESSOR) 25 MG tablet TAKE 1 TABLET BY MOUTH TWO TIMES A DAY 03/06/14  Yes Sherren Mocha, MD    Inpatient medications: . aspirin EC  81 mg Oral Daily  . atorvastatin  80 mg Oral QHS  . docusate sodium  100 mg Oral BID  . guaiFENesin  600 mg Oral BID  . heparin  5,000 Units Subcutaneous 3 times per day  . latanoprost  1 drop Both Eyes QHS  . metoprolol tartrate  50 mg Oral BID  . pantoprazole  40 mg Oral Q1200  . polyethylene glycol  17 g Oral BID  . regadenoson  0.4 mg Intravenous Once  . sodium chloride  3 mL Intravenous Q12H    Review of Systems No HA, chills or fevers Nausea and some anorexia for over 3 weeks (preceding her [facility] admission) Dry non-productive cough since March No epistaxis, sinus infection or sinusitus, hemoptysis, skin rash + DOE + profound fatigue Has had no edema at any point Reduction in UOP that she says was noticeable during [facility] admission   Physical Exam:  BP 121/57 mmHg  Pulse 77  Temp(Src) 99.1 F (37.3 C) (Oral)  Resp 18  Ht $R'5\' 2"'hF$  (1.575 m)  Wt 78.9 kg (173 lb 15.1 oz)  BMI 31.81 kg/m2  SpO2 95%  Gen: Pale appearing WF, pleasant, unusual speech (almost as if using abdominal muscles to get her words out...) Skin: no rash, cyanosis Neck: no JVD, no  bruits or LAN Chest: Lung fields are clear poteriorly Heart:  S2 S2 No S3 1-2/6 murmur left sternal border Abdomen: soft, + BS, no abdominal bruits. No masses or tenderness. Ext: No edema of the LE's. Neuro: alert, Ox3, no focal deficit Heme/Lymph: no bruising or LAN   Labs: Basic Metabolic Panel:  Recent Labs Lab 09/28/14 2217 09/29/14 0340 09/29/14 1214 09/30/14 0447 10/01/14 1300 10/01/14 2013 10/02/14 0747  NA 139 142 141 142 144  --  142  K 4.1 4.5 4.2 4.6 4.3 4.6 4.6  CL 106 109 109 110 110  --  109  CO2 24 22 21* 22 23  --  23  GLUCOSE 153* 110* 142* 117* 142*  --  121*  BUN 45* 46* 45* 50* 46*  --  45*  CREATININE 4.10* 3.94* 3.82* 3.69* 3.64*  --  3.74*  CALCIUM 8.7* 8.6* 8.3* 8.4* 8.5*  --  8.6*  PHOS  --  5.4*  --   --   --   --   --      Recent Labs Lab 09/29/14 0340  AST 20  ALT 23  ALKPHOS 76  BILITOT 0.8  PROT 6.0*  ALBUMIN 3.3*   Recent Labs Lab 09/29/14 0340 09/30/14 0447 10/01/14 0551 10/02/14 0747  WBC 3.2* 2.7* 2.9* 4.0  HGB 8.1* 7.4* 7.3* 10.8*  HCT 24.5* 22.7* 22.8* 32.8*  MCV 99.2 100.4* 100.9* 97.6  PLT 169 149* 148* 147*     Recent Labs Lab 09/28/14 2332 09/29/14 0020 09/29/14 0340 09/29/14 1214 09/29/14 1818  TROPONINI 0.03 0.03 0.03 0.03 <0.03    Iron Studies:  Recent Labs Lab 09/29/14 0020  IRON 88  TIBC 312  FERRITIN 154    Background 74 y.o. year-old woman with a background of HTN, obesity, diastolic CHF, severe hypertrophic CM, CAD (s/p PTA and stent of LAD lesion 2010), COPD, compression fractures. She was hospitalized at [facility] 5/17-5/19 with SOB, felt due to an exacerbation of CHF. Was on losartan, and received IV lasix. Only creatinine in records sent is from day of admission there which was 1.11 (There are no other prior creatinines available to me other than from 2010). Discharged on po lasix, usual losartan, and azithro for H Pylori  Returned to to the ED on 5/21 "feeling no better" in terms  of her cough, fatigue and SOB. Creatinine was up to 4.10 and despite gentle hydration, withholding ARB and lasix, kidney function has not appreciable improved and we are asked to see. Baseline creatinine 1.1 as recently as 09/23/14 [facility])  Impression/Plan AKI on CKD3 - Unclear etiology. Urinalysis is pretty bland. Ultrasound shows preserved renal sizes with normal echogenicity.   Possibilities include    -hemodynamically mediated AKI from IV diuresis with ARB on board (would have expected some improvement by now),   -contribution of cardiorenal syndrome (though no overt CHF at present)   -with presence of significant anemia, recent (November) compression fracture, obviously worry about dysproteinemia such as     myeloma (brought out by recent diuresis) (she is anemic without Fe def, and has mildly depressed plts and WBC as well   -Doubt a pulmonary renal syndrome but certainly cannot entirely exclude given prevalence of cough, SOB (though has enough     cardiac issues to explain this)    Off ARB, diuretic and other kidney unfriendly medications    SPEP and UPEP sent and pending. Serum free light chains added    Repeat UA, UPC and ANCA ordered    Would back of on BB if BP consistently low 100's to allow for better renal perfusion    If she fails to to improve and studies not informative, could conceivably come to renal biopsy.  Will hope she improves.  Dr. Marval Regal will f/u in the AM Thanks for the consult.  Jamal Maes,  MD [facility] [phone] pager 10/02/2014, 3:10 PM

## 2014-10-02 NOTE — Progress Notes (Addendum)
Patient ID: [name] [name], female   DOB: May 28, 1940, 74 y.o.   MRN: [id number] [facility] PROGRESS NOTE  [name] [name] [id number]:[id number] DOB: 06/05/1940 DOA: 09/28/2014 PCP: Neale Burly, MD  Brief narrative:    74 year old female with past medical history of chronic diastolic CHF, hypertrophy cardiomyopathy, extensive cardiac work up in past including right and left heart catheterization in 2010 (with 90% LAD lesion which was stented), COPD, hypertension, recent hospitalization in [facility] hospital for acute diastolic CHF where she underwent pretty aggressive diureses. Patient now presented to [facility] ED with intermittent chest tightness, cough ever since discharge from [facility].   On this admission, she was found to have acute renal failure with creatinine level in 4 range (her baseline is normal creatinine). Initial troponin was WNL. Cardio has seen in her consultation and plans for stress test. Of note, CXR on admission showed possible early pulmonary edema.    Assessment/Plan:    Chest pain - In pt with extensive cardiac history and work up, concern is always for ACS - Her troponin levels remain WNL - Continue aspirin, Lipitor and metoprolol  - Cardiology is following, appreciate their recommendations  -NST; Normal study with normal perfusion. Low risk test. EF 65%.  Acute renal failure - Unclear etiology -  value and on this admission 4.1 - cr fluctuates 3.6 to 3.7  -SPEP, UPEP pending.  -Renal US; No evidence of hydronephrosis or bladder distention. Right nephrolithiasis.  -UA 30 gr protein.  -Will consult renal.   Anemia, pancytopenia:  Patient denies melena, hematochezia. She had colonoscopy 2014.  Last hb 5 years ago was at 77. Anemia panel: normal B-12, iron ferritin.  peripheral smear ordered . UPEP, SPEP pending, . Concern with MM in setting of renal failure.  Hematology  Oncology consulted/  occult blood negative S/P  2 PRBC.   Acute on chronic  diastolic heart failure - Pt with history of diastolic CHF - 2 D ECHO on this admission showed normal EF but septal hypertrophy - BNP 597 on this admission - Weight since admission, 77 kg --> 78.9 kg - Will follow up with cardio about CHF management. Pt currently not on lasix due to acute renal failure. - Continue daily weight and strict intake and output  -holding lasix due to renal failure.    Essential hypertension - Continue metoprolol 25 mg PO BID  Dyslipidemia - Continue statin therapy   DVT Prophylaxis  - Heprain subQ ordered   Code Status: Full.  Family Communication:  plan of care discussed with the patient Disposition Plan: Home once renal function, and anemia sable. Work up in process.   IV access:  Peripheral IV  Procedures and diagnostic studies:    Ct Chest Wo Contrast 2014/10/20 Patchy ground-glass airspace opacity with interlobular septal thickening and bronchial wall thickening most compatible with early pulmonary edema.  Moderate cardiomegaly.   Electronically Signed   By: Conchita Paris M.D.   On: 2014/10/20 10:42   US Renal Oct 20, 2014  Bilateral simple renal cysts. Right nephrolithiasis. Exam is otherwise normal. No evidence of hydronephrosis or bladder distention.   Electronically Signed   By: [place]   On: 20-Oct-2014 10:14   Nm Myocar Multi W/spect Tamela Oddi Motion / Ef 09/30/2014  Dg Chest Port 1 View 09/28/2014  1.  No acute cardiopulmonary disease. 2. Grossly unchanged bibasilar atelectasis/scar, left greater than right.     Medical Consultants:  Cardiology  Oncology.   Other Consultants:  None   IAnti-Infectives:  Elmarie Shiley, MD  [facility] Pager [phone]  Time spent in minutes: 25 minutes  If 7PM-7AM, please contact night-coverage [email or web address] Password [email or web address] 10/02/2014, 1:03 PM   LOS: 3 days    HPI/Subjective: She is feeling better today, dyspnea has improved.   Objective: Filed Vitals:   10/01/14 1915  10/01/14 2119 10/02/14 0603 10/02/14 1000  BP: 130/46 126/45 106/33 121/57  Pulse: 83 80 70 77  Temp: 98.5 F (36.9 C) 99 F (37.2 C) 98.2 F (36.8 C) 99.1 F (37.3 C)  TempSrc: Oral Oral Oral Oral  Resp: 16 18 16 18   Height:      Weight:   78.9 kg (173 lb 15.1 oz)   SpO2: 97% 97% 95% 95%    Intake/Output Summary (Last 24 hours) at 10/02/14 1303 Last data filed at 10/02/14 1110  Gross per 24 hour  Intake   1615 ml  Output   1300 ml  Net    315 ml    Exam:   General:  Pt is alert, follows commands appropriately, not in acute distress  Cardiovascular: Regular rate and rhythm, S1/S2 (+)  Respiratory: diminished breath sounds bilaterally, no wheezing, no crackles, no rhonchi  Abdomen: Soft, non tender, non distended, bowel sounds present  Extremities: No edema, pulses DP and PT palpable bilaterally  Neuro: Grossly nonfocal  Data Reviewed: Basic Metabolic Panel:  Recent Labs Lab 09/29/14 0340 09/29/14 1214 09/30/14 0447 10/01/14 1300 10/01/14 2013 10/02/14 0747  NA 142 141 142 144  --  142  K 4.5 4.2 4.6 4.3 4.6 4.6  CL 109 109 110 110  --  109  CO2 22 21* 22 23  --  23  GLUCOSE 110* 142* 117* 142*  --  121*  BUN 46* 45* 50* 46*  --  45*  CREATININE 3.94* 3.82* 3.69* 3.64*  --  3.74*  CALCIUM 8.6* 8.3* 8.4* 8.5*  --  8.6*  MG 2.2  --   --   --  2.2  --   PHOS 5.4*  --   --   --   --   --    Liver Function Tests:  Recent Labs Lab 09/29/14 0340  AST 20  ALT 23  ALKPHOS 76  BILITOT 0.8  PROT 6.0*  ALBUMIN 3.3*   No results for input(s): LIPASE, AMYLASE in the last 168 hours. No results for input(s): AMMONIA in the last 168 hours. CBC:  Recent Labs Lab 09/28/14 2217 09/29/14 0340 09/30/14 0447 10/01/14 0551 10/02/14 0747  WBC 3.6* 3.2* 2.7* 2.9* 4.0  HGB 8.2* 8.1* 7.4* 7.3* 10.8*  HCT 25.1* 24.5* 22.7* 22.8* 32.8*  MCV 99.2 99.2 100.4* 100.9* 97.6  PLT 178 169 149* 148* 147*   Cardiac Enzymes:  Recent Labs Lab 09/28/14 2332  09/29/14 0020 09/29/14 0340 09/29/14 1214 09/29/14 1818  TROPONINI 0.03 0.03 0.03 0.03 <0.03   BNP: Invalid input(s): POCBNP CBG: No results for input(s): GLUCAP in the last 168 hours.  No results found for this or any previous visit (from the past 240 hour(s)).   Scheduled Meds: . aspirin EC  81 mg Oral Daily  . atorvastatin  80 mg Oral QHS  . doxycycline  100 mg Oral Q12H  . heparin  5,000 Units Subcutaneous 3 times per day  . metoprolol tartrate  25 mg Oral BID  . pantoprazole  40 mg Oral Q1200

## 2014-10-02 NOTE — Progress Notes (Addendum)
Patient Name: [name] [name] Date of Encounter: 10/02/2014  Primary Cardiologist: Dr. Jeronimo Greaves. Bensimhon   Principal Problem:   Acute renal failure Active Problems:   DYSPNEA   Diastolic heart failure   Chest pain   Anemia   Chronic diastolic heart failure   Acute on chronic combined systolic and diastolic heart failure   Essential hypertension    SUBJECTIVE  No more chest pain. Nauseated this morning. Denies any SOB. However has cough.  CURRENT MEDS . aspirin EC  81 mg Oral Daily  . atorvastatin  80 mg Oral QHS  . docusate sodium  100 mg Oral BID  . guaiFENesin  600 mg Oral BID  . heparin  5,000 Units Subcutaneous 3 times per day  . latanoprost  1 drop Both Eyes QHS  . metoprolol tartrate  25 mg Oral BID  . pantoprazole  40 mg Oral Q1200  . polyethylene glycol  17 g Oral BID  . regadenoson  0.4 mg Intravenous Once  . sodium chloride  3 mL Intravenous Q12H    OBJECTIVE  Filed Vitals:   10/01/14 1915 10/01/14 2119 10/02/14 0603 10/02/14 1000  BP: 130/46 126/45 106/33 121/57  Pulse: 83 80 70 77  Temp: 98.5 F (36.9 C) 99 F (37.2 C) 98.2 F (36.8 C) 99.1 F (37.3 C)  TempSrc: Oral Oral Oral Oral  Resp: 16 18 16 18   Height:      Weight:   78.9 kg (173 lb 15.1 oz)   SpO2: 97% 97% 95% 95%    Intake/Output Summary (Last 24 hours) at 10/02/14 1308 Last data filed at 10/02/14 1110  Gross per 24 hour  Intake   1615 ml  Output   1300 ml  Net    315 ml   Filed Weights   09/29/14 2159 10/01/14 0454 10/02/14 0603  Weight: 78.926 kg (174 lb) 78.8 kg (173 lb 11.6 oz) 78.9 kg (173 lb 15.1 oz)    PHYSICAL EXAM  General: Pleasant, NAD. Neuro: Alert and oriented X 3. Moves all extremities spontaneously. Psych: Normal affect. HEENT:  Normal  Neck: Supple without bruits or JVD. Lungs:  Resp regular and unlabored, CTA. Heart: RRR no s3, s4. 2/6 systolic murmur at apex Abdomen: Soft, non-tender, non-distended, BS + x 4.  Extremities: No clubbing, cyanosis or  edema. DP/PT/Radials 2+ and equal bilaterally.  Accessory Clinical Findings  CBC  Recent Labs  10/01/14 0551 10/02/14 0747  WBC 2.9* 4.0  HGB 7.3* 10.8*  HCT 22.8* 32.8*  MCV 100.9* 97.6  PLT 148* 196*   Basic Metabolic Panel  Recent Labs  10/01/14 1300 10/01/14 2013 10/02/14 0747  NA 144  --  142  K 4.3 4.6 4.6  CL 110  --  109  CO2 23  --  23  GLUCOSE 142*  --  121*  BUN 46*  --  45*  CREATININE 3.64*  --  3.74*  CALCIUM 8.5*  --  8.6*  MG  --  2.2  --    Liver Function Tests No results for input(s): AST, ALT, ALKPHOS, BILITOT, PROT, ALBUMIN in the last 72 hours. Cardiac Enzymes  Recent Labs  09/29/14 1818  TROPONINI <0.03   Hemoglobin A1C No results for input(s): HGBA1C in the last 72 hours. Thyroid Function Tests No results for input(s): TSH, T4TOTAL, T3FREE, THYROIDAB in the last 72 hours.  Invalid input(s): FREET3  ECG  No new EKG  Echocardiogram  LV EF: 65% -  70%  ------------------------------------------------------------------- Indications:  Dyspnea 786.09.  ------------------------------------------------------------------- History:  PMH:  Chest pain. Risk factors: Hypertension.  ------------------------------------------------------------------- Study Conclusions  - Left ventricle: The cavity size was normal. There was severe focal basal and moderate concentric hypertrophy of the left ventricle. Systolic function was vigorous. The estimated ejection fraction was in the range of 65% to 70%. Wall motion was normal; there were no regional wall motion abnormalities. Features are consistent with a pseudonormal left ventricular filling pattern, with concomitant abnormal relaxation and increased filling pressure (grade 2 diastolic dysfunction). Doppler parameters are consistent with elevated ventricular end-diastolic filling pressure. - Mitral valve: Calcified annulus. Moderately thickened,  moderately calcified leaflets . The findings are consistent with moderate stenosis. There was moderate regurgitation directed centrally. - Left atrium: The atrium was moderately dilated. - Right atrium: The atrium was moderately dilated. - Tricuspid valve: There was moderate-severe regurgitation. - Pulmonary arteries: PA peak pressure: 68 mm Hg (S).  Impressions:  - There is severe basal septal hypertrophy. LVEF is hyperdanamic. There is chordal systolic anterior motion of the mitral valve. LVOT gradient is 28 mmHg at rest and 128 mmHg with Valsalva maneuver. Findings are consistent with hypertrophic cardiomyopathy. CMR is recommended for risk stratification.    Radiology/Studies  Ct Chest Wo Contrast  09/29/2014   CLINICAL DATA:  Shortness of breath and nonproductive cough  EXAM: CT CHEST WITHOUT CONTRAST  TECHNIQUE: Multidetector CT imaging of the chest was performed following the standard protocol without IV contrast.  COMPARISON:  Chest radiograph 09/28/2014, thoracic MRI 05/28/2014  FINDINGS: Mediastinum/Nodes: Moderate atheromatous aortic and coronary arterial calcification without aneurysm. Moderate cardiac enlargement is noted. No pericardial effusion. No lymphadenopathy. Probable vascular at ectasia at the level of the right brachiocephalic artery/subclavian takeoff image 4.  Lungs/Pleura: Areas of patchy ground-glass airspace opacity are identified with interlobular septal thickening and central bronchial wall thickening. Central airways are patent. Trace pleural fluid.  Upper abdomen: The upper abdominal viscera unremarkable where visualized.  Musculoskeletal: T10 vertebra plana reidentified. No new acute osseous abnormality. The bones are subjectively osteopenic.  IMPRESSION: Patchy ground-glass airspace opacity with interlobular septal thickening and bronchial wall thickening most compatible with early pulmonary edema.  Moderate cardiomegaly.   Electronically Signed    By: Conchita Paris M.D.   On: 09/29/2014 10:42   US Renal  09/29/2014   CLINICAL DATA:  Acute renal failure.  EXAM: RENAL / URINARY TRACT ULTRASOUND COMPLETE  COMPARISON:  None.  FINDINGS: Right Kidney:  Length: 11.8 cm. Echogenicity within normal limits. No hydronephrosis visualized. 8 mm cyst right upper renal pole. Right nephrolithiasis noted.  Left Kidney:  Length: 12.1 cm. Echogenicity within normal limits. No hydronephrosis visualized. 8 mm cyst left lower renal pole.  Bladder:  Appears normal for degree of bladder distention.  IMPRESSION: Bilateral simple renal cysts. Right nephrolithiasis. Exam is otherwise normal. No evidence of hydronephrosis or bladder distention.   Electronically Signed   By: Marcello Moores  Register   On: 09/29/2014 10:14   Nm Myocar Multi W/spect W/wall Motion / Ef  09/30/2014    The study is normal.  This is a low risk study.  The left ventricular ejection fraction is hyperdynamic (>65%).  There was no ST segment deviation noted during stress.   Normal Lexiscan myovue with no ischemia or infarction EF 69%   Dg Chest Port 1 View  09/28/2014   CLINICAL DATA:  Shortness of breath and chest pressure with dry cough for the past 3 weeks. Recent diagnosis of congestive heart failure.  EXAM: PORTABLE CHEST - 1  VIEW  COMPARISON:  09/23/2014; 09/22/2012  FINDINGS: Grossly unchanged enlarged cardiac silhouette and mediastinal contours with partially calcified right suprahilar lymph nodes. Grossly unchanged bibasilar heterogeneous opacities, left greater than right, likely atelectasis or scar. No new focal airspace opacities. No pleural effusion or pneumothorax. No evidence of edema. No acute osseus abnormalities.  IMPRESSION: 1.  No acute cardiopulmonary disease. 2. Grossly unchanged bibasilar atelectasis/scar, left greater than right.   Electronically Signed   By: Sandi Mariscal M.D.   On: 09/28/2014 22:15    ASSESSMENT AND PLAN  74 yo female with h/o obesity, HTN, diastolic HF,  hypertrophic CM, CAD and COPD present to [facility] on 09/29/2014 with coughing and intermittent chest tightness. Had significant jump in Cr after being discharged from [facility] for HF on 5/19. Cr increased from 1.11 on discharge to 4.1 on arrival.   1. Chest pain  - Echo shows preserved EF, grade 2 diastolic dysfunction  - low risk stress test 09/30/2014  - no further cardiac workup, outpatient followup already arranged  - treat anemia, with severe hypertrophic cardiomyopathy, should be evaluated by Dr. Burt Knack as outpatient once renal function improves to assess if she is a candidate for [facility] ablation vs myomectomy if her sx persists  - some bursts of SVT on telemetry overnight, currently on 25mg  BID metoprolol, BP is not ideal to uptitrate. Would continue to monitor for now.  2. ARF: likely related to overdiuresis  - s/p IV hydration with some improvement in Cr  3. Chronic diastolic HF  4. Anemia: hgb 7.3. - now up to 10 .   Consider iron therapy, improving hgb may help with symptoms  5. Moderate MR on echo 6. Moderate to severe TR on echo 7. Severe hypertrophic cardiomyopathy Will increase metoprolol to 50 bid    No active cardiac issues.  Call for questions    Ramond Dial., MD, [facility] 10/02/2014, 1:08 PM [address]  [place] Pager [phone]

## 2014-10-02 NOTE — Plan of Care (Signed)
Problem: Phase I Progression Outcomes Goal: EF % per last Echo/documented,Core Reminder form on chart Outcome: Completed/Met Date Met:  10/02/14 65-70% this admission Goal: Other Phase I Outcomes/Goals Outcome: Progressing Request for renal records from patient's PCP. Fax sent to [facility]/Dr. Nadeen Landau, fax [phone].

## 2014-10-02 NOTE — Progress Notes (Signed)
Physical Therapy Treatment Patient Details Name: [name] [name] MRN: [id number] DOB: 1940-06-19 Today's Date: Oct 13, 2014    History of Present Illness 74 yo female presented to ED with SOB, chest tightness, and cough; has a past medical history of HTN (hypertension); Hyperlipidemia; Hypertrophic cardiomyopathy; CAD (coronary artery disease); COPD (chronic obstructive pulmonary disease); Obesity; Pre-syncope; and CHF (congestive heart failure).; recent admission to [facility]    PT Comments    Patient making good improvements with mobility and gait.  O2 sats remaining at 100% with ambulation on room air.  Will attempt stairs at next visit.  Follow Up Recommendations  No PT follow up;Supervision - Intermittent     Equipment Recommendations  None recommended by PT    Recommendations for Other Services       Precautions / Restrictions Precautions Precautions: None Restrictions Weight Bearing Restrictions: No    Mobility  Bed Mobility               General bed mobility comments: Patient sitting EOB as PT entered room  Transfers Overall transfer level: Needs assistance Equipment used: None Transfers: Sit to/from Stand Sit to Stand: Min guard         General transfer comment: Assist for safety only.  Patient with good balance in stance  Ambulation/Gait Ambulation/Gait assistance: Min guard Ambulation Distance (Feet): 160 Feet Assistive device: None Gait Pattern/deviations: Step-through pattern;Decreased stride length     General Gait Details: Slightly unsteady gait, slow and guarded.  Assist for safety.  Patient ambulated on room air with O2 sats remaining at 100%.   Stairs            Wheelchair Mobility    Modified Rankin (Stroke Patients Only)       Balance                                    Cognition Arousal/Alertness: Awake/alert Behavior During Therapy: WFL for tasks assessed/performed Overall Cognitive Status: Within  Functional Limits for tasks assessed                      Exercises      General Comments        Pertinent Vitals/Pain Pain Assessment: No/denies pain    Home Living                      Prior Function            PT Goals (current goals can now be found in the care plan section) Progress towards PT goals: Progressing toward goals    Frequency  Min 3X/week    PT Plan Current plan remains appropriate    Co-evaluation             End of Session Equipment Utilized During Treatment: Gait belt Activity Tolerance: Patient tolerated treatment well Patient left: in bed;with call bell/phone within reach;with family/visitor present (sitting EOB visiting with family)     Time: 1750-1801 PT Time Calculation (min) (ACUTE ONLY): 11 min  Charges:  $Gait Training: 8-22 mins                    G Codes:      Despina Pole 10/13/14, 8:18 PM Carita Pian. Sanjuana Kava, [place] Pager [phone]

## 2014-10-03 DIAGNOSIS — D538 Other specified nutritional anemias: Secondary | ICD-10-CM

## 2014-10-03 LAB — CBC
HCT: 30 % — ABNORMAL LOW (ref 36.0–46.0)
HEMOGLOBIN: 9.8 g/dL — AB (ref 12.0–15.0)
MCH: 32.3 pg (ref 26.0–34.0)
MCHC: 32.7 g/dL (ref 30.0–36.0)
MCV: 99 fL (ref 78.0–100.0)
PLATELETS: 130 10*3/uL — AB (ref 150–400)
RBC: 3.03 MIL/uL — ABNORMAL LOW (ref 3.87–5.11)
RDW: 15.9 % — ABNORMAL HIGH (ref 11.5–15.5)
WBC: 3.1 10*3/uL — ABNORMAL LOW (ref 4.0–10.5)

## 2014-10-03 LAB — PHOSPHORUS: Phosphorus: 4.9 mg/dL — ABNORMAL HIGH (ref 2.5–4.6)

## 2014-10-03 LAB — URINALYSIS, ROUTINE W REFLEX MICROSCOPIC
Bilirubin Urine: NEGATIVE
GLUCOSE, UA: NEGATIVE mg/dL
Ketones, ur: NEGATIVE mg/dL
LEUKOCYTES UA: NEGATIVE
NITRITE: NEGATIVE
Protein, ur: 100 mg/dL — AB
SPECIFIC GRAVITY, URINE: 1.024 (ref 1.005–1.030)
Urobilinogen, UA: 1 mg/dL (ref 0.0–1.0)
pH: 5.5 (ref 5.0–8.0)

## 2014-10-03 LAB — COMPREHENSIVE METABOLIC PANEL
ALBUMIN: 3.1 g/dL — AB (ref 3.5–5.0)
ALT: 85 U/L — AB (ref 14–54)
ANION GAP: 9 (ref 5–15)
AST: 65 U/L — ABNORMAL HIGH (ref 15–41)
Alkaline Phosphatase: 103 U/L (ref 38–126)
BUN: 46 mg/dL — AB (ref 6–20)
CALCIUM: 8.5 mg/dL — AB (ref 8.9–10.3)
CHLORIDE: 111 mmol/L (ref 101–111)
CO2: 23 mmol/L (ref 22–32)
Creatinine, Ser: 3.81 mg/dL — ABNORMAL HIGH (ref 0.44–1.00)
GFR calc Af Amer: 12 mL/min — ABNORMAL LOW (ref >60)
GFR, EST NON AFRICAN AMERICAN: 11 mL/min — AB (ref >60)
Glucose, Bld: 125 mg/dL — ABNORMAL HIGH (ref 65–99)
POTASSIUM: 4.9 mmol/L (ref 3.5–5.1)
Sodium: 143 mmol/L (ref 135–145)
Total Bilirubin: 1.1 mg/dL (ref 0.3–1.2)
Total Protein: 6 g/dL — ABNORMAL LOW (ref 6.5–8.1)

## 2014-10-03 LAB — URINE MICROSCOPIC-ADD ON

## 2014-10-03 LAB — PROTEIN / CREATININE RATIO, URINE
Creatinine, Urine: 115.93 mg/dL
PROTEIN CREATININE RATIO: 12.9 mg/mg{creat} — AB (ref 0.00–0.15)
TOTAL PROTEIN, URINE: 1495 mg/dL

## 2014-10-03 MED ORDER — BOOST / RESOURCE BREEZE PO LIQD
1.0000 | Freq: Three times a day (TID) | ORAL | Status: DC
  Administered 2014-10-03 – 2014-10-04 (×2): 1 via ORAL
  Administered 2014-10-04: 1 mL via ORAL
  Administered 2014-10-05 – 2014-10-08 (×6): 1 via ORAL

## 2014-10-03 MED ORDER — ONDANSETRON HCL 4 MG PO TABS: 4.0000 mg | ORAL_TABLET | Freq: Three times a day (TID) | ORAL | Status: DC | PRN

## 2014-10-03 MED ORDER — PROMETHAZINE HCL 25 MG/ML IJ SOLN
12.5000 mg | Freq: Once | INTRAMUSCULAR | Status: AC
  Administered 2014-10-03: 12.5 mg via INTRAVENOUS
  Filled 2014-10-03: qty 1

## 2014-10-03 MED ORDER — SIMETHICONE 80 MG PO CHEW
80.0000 mg | CHEWABLE_TABLET | Freq: Once | ORAL | Status: AC
  Administered 2014-10-03: 80 mg via ORAL
  Filled 2014-10-03: qty 1

## 2014-10-03 NOTE — Progress Notes (Signed)
Patient ID: [name] [name], female   DOB: 1941/03/26, 74 y.o.   MRN: [id number] [facility] PROGRESS NOTE  [name] [name] [id number] DOB: 24-Oct-1940 DOA: 09/28/2014 PCP: Neale Burly, MD  Brief narrative:    74 year old female with past medical history of chronic diastolic CHF, hypertrophy cardiomyopathy, extensive cardiac work up in past including right and left heart catheterization in 2010 (with 90% LAD lesion which was stented), COPD, hypertension, recent hospitalization in [facility] hospital for acute diastolic CHF where she underwent pretty aggressive diureses. Patient now [facility] with intermittent chest tightness, cough ever since discharge from [facility].   On this admission, she was found to have acute renal failure with creatinine level in 4 range (her baseline is normal creatinine). Initial troponin was WNL. Cardio has seen in her consultation and plans for stress test. Of note, CXR on admission showed possible early pulmonary edema.    Assessment/Plan:    Acute renal failure - Unclear etiology -  value and on this admission 4.1 - cr fluctuates 3.6 to 3.7  -SPEP, UPEP pending. Mild M spike rest of test pending.  -Renal US; No evidence of hydronephrosis or bladder distention. Right nephrolithiasis.  -UA 30 gr protein.  -Appreciate nephrology evaluation. hemodynamic vs dysproteinemia.  -Light chain and ANCA pending.   Anemia, pancytopenia:  Patient denies melena, hematochezia. She had colonoscopy 2014.  Last hb 5 years ago was at 54. Anemia panel: normal B-12, iron ferritin.  peripheral smear negative for schitocytes. UPEP, SPEP pending, Mild M spike.  Hematology  Oncology consulted, and appreciate help.  occult blood negative S/P  2 PRBC.  Bone survey only showed osteopenia. Prior compression fracture. Patient was aware of compression fracture.   Acute on chronic diastolic heart failure - Pt with history of diastolic CHF - 2 D ECHO on  this admission showed normal EF but septal hypertrophy - BNP 597 on this admission - Weight since admission, 77 kg --> 78.9 kg - Will follow up with cardio about CHF management. Pt currently not on lasix due to acute renal failure. -Continue daily weight and strict intake and output  -Holding lasix due to renal failure.    Chest pain - In pt with extensive cardiac history and work up, concern is always for ACS - Her troponin levels remain WNL - Continue aspirin, Lipitor and metoprolol  - Cardiology sign off,  appreciate their recommendations  -NST; Normal study with normal perfusion. Low risk test. EF 65%.  Essential hypertension - Continue metoprolol 25 mg PO BID  Dyslipidemia - Continue statin therapy   DVT Prophylaxis  - Heprain subQ ordered   Code Status: Full.  Family Communication:  plan of care discussed with the patient and daughter in law at bedside.  Disposition Plan: Home once renal function, and anemia sable. Work up in process.   IV access:  Peripheral IV  Procedures and diagnostic studies:    Ct Chest Wo Contrast 10/05/2014 Patchy ground-glass airspace opacity with interlobular septal thickening and bronchial wall thickening most compatible with early pulmonary edema.  Moderate cardiomegaly.   Electronically Signed   By: Conchita Paris M.D.   On: 10-05-14 10:42   US Renal 10/05/14  Bilateral simple renal cysts. Right nephrolithiasis. Exam is otherwise normal. No evidence of hydronephrosis or bladder distention.   Electronically Signed   By: [place]   On: 10-05-2014 10:14   Nm Myocar Multi W/spect Tamela Oddi Motion / Ef 09/30/2014  Dg Chest Port 1 [address]  09/28/2014  1.  No acute cardiopulmonary disease. 2. Grossly unchanged bibasilar atelectasis/scar, left greater than right.     Medical Consultants:  Cardiology  Oncology.   Other Consultants:  None   IAnti-Infectives:      Elmarie Shiley, MD  [facility] Pager [phone]  Time spent in  minutes: 25 minutes  If 7PM-7AM, please contact night-coverage [email or web address] Password [email or web address] 10/03/2014, 12:21 PM   LOS: 4 days    HPI/Subjective: She has been having nausea. Denies constipation.  she is breathing better.   Objective: Filed Vitals:   10/02/14 2204 10/03/14 0532 10/03/14 0700 10/03/14 0849  BP: 154/38 124/57  121/36  Pulse: 79 72  74  Temp: 97.9 F (36.6 C) 98.2 F (36.8 C)  98.2 F (36.8 C)  TempSrc: Oral Oral  Oral  Resp: 17 18  18   Height:      Weight:   77.474 kg (170 lb 12.8 oz)   SpO2: 99% 100%  100%    Intake/Output Summary (Last 24 hours) at 10/03/14 1221 Last data filed at 10/03/14 1100  Gross per 24 hour  Intake   1440 ml  Output   1125 ml  Net    315 ml    Exam:   General:  Pt is alert, follows commands appropriately, not in acute distress  Cardiovascular: Regular rate and rhythm, S1/S2 (+)  Respiratory: diminished breath sounds bilaterally, no wheezing, no crackles, no rhonchi  Abdomen: Soft, non tender, non distended, bowel sounds present  Extremities: No edema, pulses DP and PT palpable bilaterally  Neuro: Grossly nonfocal  Data Reviewed: Basic Metabolic Panel:  Recent Labs Lab 09/29/14 0340 09/29/14 1214 09/30/14 0447 10/01/14 1300 10/01/14 2013 10/02/14 0747 10/03/14 0623  NA 142 141 142 144  --  142 143  K 4.5 4.2 4.6 4.3 4.6 4.6 4.9  CL 109 109 110 110  --  109 111  CO2 22 21* 22 23  --  23 23  GLUCOSE 110* 142* 117* 142*  --  121* 125*  BUN 46* 45* 50* 46*  --  45* 46*  CREATININE 3.94* 3.82* 3.69* 3.64*  --  3.74* 3.81*  CALCIUM 8.6* 8.3* 8.4* 8.5*  --  8.6* 8.5*  MG 2.2  --   --   --  2.2  --   --   PHOS 5.4*  --   --   --   --   --  4.9*   Liver Function Tests:  Recent Labs Lab 09/29/14 0340 10/03/14 0623  AST 20 65*  ALT 23 85*  ALKPHOS 76 103  BILITOT 0.8 1.1  PROT 6.0* 6.0*  ALBUMIN 3.3* 3.1*   No results for input(s): LIPASE, AMYLASE in the last 168 hours. No results for input(s): AMMONIA  in the last 168 hours. CBC:  Recent Labs Lab 09/29/14 0340 09/30/14 0447 10/01/14 0551 10/02/14 0747 10/03/14 0623  WBC 3.2* 2.7* 2.9* 4.0 3.1*  HGB 8.1* 7.4* 7.3* 10.8* 9.8*  HCT 24.5* 22.7* 22.8* 32.8* 30.0*  MCV 99.2 100.4* 100.9* 97.6 99.0  PLT 169 149* 148* 147* 130*   Cardiac Enzymes:  Recent Labs Lab 09/28/14 2332 09/29/14 0020 09/29/14 0340 09/29/14 1214 09/29/14 1818  TROPONINI 0.03 0.03 0.03 0.03 <0.03   BNP: Invalid input(s): POCBNP CBG: No results for input(s): GLUCAP in the last 168 hours.  No results found for this or any previous visit (from the past 240 hour(s)).   Scheduled Meds: . aspirin EC  81 mg Oral Daily  .  atorvastatin  80 mg Oral QHS  . doxycycline  100 mg Oral Q12H  . heparin  5,000 Units Subcutaneous 3 times per day  . metoprolol tartrate  25 mg Oral BID  . pantoprazole  40 mg Oral Q1200

## 2014-10-03 NOTE — Progress Notes (Addendum)
Hematology follow-up: Her serum protein electrophoresis showed a very small spike of 0.3 g/dL. Her serum light chain and quantitative immunoglobulins are currently pending.  Her skeletal survey showed osteopenia which is not really very diagnostic at this time.  Depending on the results of her rest of her workup she might require a bone marrow biopsy if these results suggest plasma cell disorder.  Her serum protein electrophoresis does not support this diagnosis but certainly she could have a light chain myeloma that is presenting with renal failure and very little M spike.  I'll follow up on these results in the next few days. Please call us questions in the meantime.

## 2014-10-03 NOTE — Progress Notes (Signed)
[facility] ROUNDING NOTE   Subjective:   Interval History: feels weak and nauseated  Objective:  Vital signs in last 24 hours:  Temp:  [97.9 F (36.6 C)-98.6 F (37 C)] 98.2 F (36.8 C) (05/27 0849) Pulse Rate:  [72-79] 74 (05/27 0849) Resp:  [17-18] 18 (05/27 0849) BP: (112-154)/(36-57) 121/36 mmHg (05/27 0849) SpO2:  [96 %-100 %] 100 % (05/27 0849) Weight:  [77.474 kg (170 lb 12.8 oz)] 77.474 kg (170 lb 12.8 oz) (05/27 0700)  Weight change: -1.426 kg (-3 lb 2.3 oz) Filed Weights   10/01/14 0454 10/02/14 0603 10/03/14 0700  Weight: 78.8 kg (173 lb 11.6 oz) 78.9 kg (173 lb 15.1 oz) 77.474 kg (170 lb 12.8 oz)    Intake/Output: I/O last 3 completed shifts: In: 7096 [P.O.:1320; I.V.:3; Blood:327] Out: 2836 [Urine:1825]   Intake/Output this shift:  Total I/O In: 600 [P.O.:480; Other:120] Out: 150 [Urine:150]  CVS- RRR RS- CTA ABD- BS present soft non-distended EXT- no edema   Basic Metabolic Panel:  Recent Labs Lab 09/29/14 0340 09/29/14 1214 09/30/14 0447 10/01/14 1300 10/01/14 2013 10/02/14 0747 10/03/14 0623  NA 142 141 142 144  --  142 143  K 4.5 4.2 4.6 4.3 4.6 4.6 4.9  CL 109 109 110 110  --  109 111  CO2 22 21* 22 23  --  23 23  GLUCOSE 110* 142* 117* 142*  --  121* 125*  BUN 46* 45* 50* 46*  --  45* 46*  CREATININE 3.94* 3.82* 3.69* 3.64*  --  3.74* 3.81*  CALCIUM 8.6* 8.3* 8.4* 8.5*  --  8.6* 8.5*  MG 2.2  --   --   --  2.2  --   --   PHOS 5.4*  --   --   --   --   --  4.9*    Liver Function Tests:  Recent Labs Lab 09/29/14 0340 10/03/14 0623  AST 20 65*  ALT 23 85*  ALKPHOS 76 103  BILITOT 0.8 1.1  PROT 6.0* 6.0*  ALBUMIN 3.3* 3.1*   No results for input(s): LIPASE, AMYLASE in the last 168 hours. No results for input(s): AMMONIA in the last 168 hours.  CBC:  Recent Labs Lab 09/29/14 0340 09/30/14 0447 10/01/14 0551 10/02/14 0747 10/03/14 0623  WBC 3.2* 2.7* 2.9* 4.0 3.1*  HGB 8.1* 7.4* 7.3* 10.8* 9.8*   HCT 24.5* 22.7* 22.8* 32.8* 30.0*  MCV 99.2 100.4* 100.9* 97.6 99.0  PLT 169 149* 148* 147* 130*    Cardiac Enzymes:  Recent Labs Lab 09/28/14 2332 09/29/14 0020 09/29/14 0340 09/29/14 1214 09/29/14 1818  TROPONINI 0.03 0.03 0.03 0.03 <0.03    BNP: Invalid input(s): POCBNP  CBG: No results for input(s): GLUCAP in the last 168 hours.  Microbiology: No results found for this or any previous visit.  Coagulation Studies: No results for input(s): LABPROT, INR in the last 72 hours.  Urinalysis:  Recent Labs  10/03/14 0804  COLORURINE YELLOW  LABSPEC 1.024  PHURINE 5.5  GLUCOSEU NEGATIVE  HGBUR SMALL*  BILIRUBINUR NEGATIVE  KETONESUR NEGATIVE  PROTEINUR 100*  UROBILINOGEN 1.0  NITRITE NEGATIVE  LEUKOCYTESUR NEGATIVE      Imaging: Dg Bone Survey Met  10/02/2014   CLINICAL DATA:  Renal failure. Clinical concern for multiple myeloma.  EXAM: METASTATIC BONE SURVEY  COMPARISON:  None.  FINDINGS: Skull:  No lytic lesions.  Cervical spine: Degenerative changes, reversal of the normal lordosis, mild anterolisthesis at the C3-4 level and mild retrolisthesis at  the C4-5 level and mild anterolisthesis at the C6-7 level. No lytic lesions.  Thoracic spine: Approximately 60% T10 vertebral compression deformity with anterior and posterior sclerosis and visible fracture line anteriorly. Approximately 30% T11 vertebral compression deformity with no definite acute fracture lines seen. No lytic lesions.  Lumbar spine mild degenerative changes with no lytic lesions.  Bilateral shoulders:  No lytic lesions.  Bilateral upper arms:  No lytic lesions.  Bilateral forearms:  No lytic lesions.  Chest: Enlarged cardiac silhouette. Diffusely prominent interstitial markings with mild diffuse peribronchial thickening. No lytic lesions.  Pelvis: No lytic lesions.  Femurs: No lytic lesions.  Lower legs: No lytic lesions. Right knee degenerative changes and old lateral tibial plateau compression  deformity.  Diffuse osteopenia is noted involving all of the bones.  IMPRESSION: 1. Diffuse osteopenia with no lytic lesions seen. 2. Approximately 60% T10 vertebral compression deformity with chronic and possible acute components. 3. Approximately 40% T11 vertebral compression deformity. 4. Cardiomegaly. 5. Chronic bronchitic changes with chronic interstitial lung disease. 6. Right knee degenerative changes and old lateral tibial plateau compression deformity.   Electronically Signed   By: Claudie Revering M.D.   On: 10/02/2014 15:50     Medications:     . aspirin EC  81 mg Oral Daily  . atorvastatin  80 mg Oral QHS  . docusate sodium  100 mg Oral BID  . guaiFENesin  600 mg Oral BID  . heparin  5,000 Units Subcutaneous 3 times per day  . latanoprost  1 drop Both Eyes QHS  . metoprolol tartrate  50 mg Oral BID  . pantoprazole  40 mg Oral Q1200  . polyethylene glycol  17 g Oral BID  . regadenoson  0.4 mg Intravenous Once  . 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 Urinalysis is pretty bland. Ultrasound shows preserved renal sizes with normal echogenicity. Serologies pending. Appreciate heme consult   HTN    Controlled for now  Anemia   Seen by heme   Will follow appears Hypoperperfusion at this point although I agree with Dr Lorrene Reid that the differential is broad .    LOS: 4 Sande Pickert W $RemoveBefor'@[email or web address]$ '@11'$ :27 AM

## 2014-10-03 NOTE — Progress Notes (Signed)
Patient had received dose of Phenergan for nausea. Patient able to respond to staff, increased lethargy. Patient alert and oriented. C/o dizziness with restlessness and discomfort. Verbalizing "I don't feel right". Able to get OOB to [facility] with 2 assist. Family friend at bedside.   Noified rapid repsonse. VSS. Recommend lab work for any changes. Notified T. Rogue Bussing, NP. Orders received. Staff will continue to monitor.

## 2014-10-03 NOTE — Progress Notes (Signed)
Patient c/o bloating and stomach fullness. C/o nausea with small relief from Zofran. Notified T. Rogue Bussing, NP. New orders received.

## 2014-10-03 NOTE — Progress Notes (Signed)
Physical Therapy Treatment Patient Details Name: [name] [name] MRN: [id number] DOB: 12-07-1940 Today's Date: Oct 07, 2014    History of Present Illness 74 yo female presented to ED with SOB, chest tightness, and cough; has a past medical history of HTN (hypertension); Hyperlipidemia; Hypertrophic cardiomyopathy; CAD (coronary artery disease); COPD (chronic obstructive pulmonary disease); Obesity; Pre-syncope; and CHF (congestive heart failure).; recent admission to [facility]    PT Comments    Patient making good improvements with mobility and gait.  Able to negotiate stairs today with min guard assist.  Follow Up Recommendations  No PT follow up;Supervision - Intermittent     Equipment Recommendations  None recommended by PT    Recommendations for Other Services       Precautions / Restrictions Precautions Precautions: None Restrictions Weight Bearing Restrictions: No    Mobility  Bed Mobility               General bed mobility comments: Patient in chair as PT entered room  Transfers Overall transfer level: Modified independent Equipment used: None Transfers: Sit to/from Stand Sit to Stand: Modified independent (Device/Increase time)         General transfer comment: Able to move sit <> stand with increased time - no physical assist needed.  Patient standing at sink "washing up" when PT first entered room.  Ambulation/Gait Ambulation/Gait assistance: Supervision Ambulation Distance (Feet): 180 Feet Assistive device: None Gait Pattern/deviations: Step-through pattern;Decreased stride length Gait velocity: Decreased Gait velocity interpretation: Below normal speed for age/gender General Gait Details: Slightly unsteady gait, slow and guarded.  Assist for safety.     Stairs Stairs: Yes Stairs assistance: Min guard Stair Management: Two rails;Step to pattern;Forwards Number of Stairs: 4 General stair comments: Instructed patient to negotiate stairs using  step-to technique for safety.   Patient with increased dyspnea 3/4 with stairs.  Wheelchair Mobility    Modified Rankin (Stroke Patients Only)       Balance                                    Cognition Arousal/Alertness: Awake/alert Behavior During Therapy: WFL for tasks assessed/performed Overall Cognitive Status: Within Functional Limits for tasks assessed                      Exercises      General Comments        Pertinent Vitals/Pain Pain Assessment: No/denies pain    Home Living                      Prior Function            PT Goals (current goals can now be found in the care plan section) Progress towards PT goals: Progressing toward goals    Frequency  Min 3X/week    PT Plan Current plan remains appropriate    Co-evaluation             End of Session Equipment Utilized During Treatment: Gait belt Activity Tolerance: Patient tolerated treatment well Patient left: in chair;with call bell/phone within reach     Time: 7209-4709 PT Time Calculation (min) (ACUTE ONLY): 16 min  Charges:  $Gait Training: 8-22 mins                    G Codes:      Despina Pole 10/07/2014, 2:00 PM Carita Pian. Rosana Hoes, PT,  [place] Pager [phone]

## 2014-10-03 NOTE — Care Management Note (Signed)
Case Management Note  Patient Details  Name: [name] [name] MRN: [id number] Date of Birth: Aug 27, 1940  Subjective/Objective:                 CM following for progression and d/c planning.   Action/Plan: 10/03/2014 Met briefly with pt and daughter, plan is to d/c to home, no needs identified at this time, will continue to follow.10/06/2014  Expected Discharge Date:        10/06/2014          Expected Discharge Plan:  Home/Self Care  In-House Referral:  NA  Discharge planning Services  CM Consult  Post Acute Care Choice:  NA Choice offered to:  NA  DME Arranged:    DME Agency:     HH Arranged:    HH Agency:     Status of Service:  In process, will continue to follow  Medicare Important Message Given:  Yes Date Medicare IM Given:  10/03/14 Medicare IM give by:  Jasmine Pang RN MPH, case manager Date Additional Medicare IM Given:    Additional Medicare Important Message give by:     If discussed at [place] of Stay Meetings, dates discussed:    Additional Comments:  Adron Bene, RN 10/03/2014, 10:32 AM

## 2014-10-03 NOTE — Progress Notes (Signed)
Called to patient room d/t bright red blood noted on front of toilet seat. Assisted pt with wiping urethral area and also rectal area. No blood noted. MD notified. Order received to send UA. Patient stated that she has noted blood  Pt stated that she had noted blood on other days since having been in the hospital this admission. Advised pt to call nurse any time she sees blood. Pt agreed. Continue to monitor. Bartholomew Crews, RN

## 2014-10-03 NOTE — Progress Notes (Signed)
Chaplain Follow-up  Patient was sleeping will follow-up if needed or requested.   10/03/14 1400  Clinical Encounter Type  Visited With Health care provider  Visit Type Follow-up;Spiritual support  Referral From Nurse

## 2014-10-03 NOTE — Progress Notes (Signed)
Initial Nutrition Assessment  DOCUMENTATION CODES:  Obesity unspecified  INTERVENTION:  Boost Breeze (TID), each supplement provides 250 kcal and 9 grams of protein  Encourage adequate PO intake.   NUTRITION DIAGNOSIS:  Inadequate oral intake related to nausea as evidenced by  (varied meal completion of 0-100%).  GOAL:  Patient will meet greater than or equal to 90% of their needs  MONITOR:  PO intake, Supplement acceptance, Weight trends, Labs, I & O's  REASON FOR ASSESSMENT:  Consult Poor PO  ASSESSMENT: has a past medical history of HTN; Hyperlipidemia; Hypertrophic cardiomyopathy; CAD; COPD; Obesity; Pre-syncope; and CHF. Presented with shortness of breath and chest tightness. Pt found to have new onset of acute renal failure.  Pt reports having a decreased appetite which has been ongoing over the past 2-3 weeks. She reports at home she usually consumes at least 2 meals a day. Weight has been stable with usual body weight ~165 lbs. Current meal completion is varied from 0-100%. During time of visit, pt has been nauseated and refused breakfast this AM. Per MD, pt likes Resource Breeze. RD to order. Pt was encouraged to eat her food at meals and to drink her supplements. Pt with no observed significant fat or muscle mass loss.   Labs: Low calcium and GFR. High BUN, creatinine, AST, and ALT.   High   Height:  Ht Readings from Last 1 Encounters:  10/01/14 5\' 2"  (1.575 m)    Weight:  Wt Readings from Last 1 Encounters:  10/03/14 170 lb 12.8 oz (77.474 kg)    Ideal Body Weight:  50 kg  Wt Readings from Last 10 Encounters:  10/03/14 170 lb 12.8 oz (77.474 kg)  06/04/14 169 lb (76.658 kg)  05/22/13 183 lb (83.008 kg)  10/10/12 175 lb (79.379 kg)  09/26/12 167 lb (75.751 kg)  07/20/12 176 lb 6.4 oz (80.015 kg)  05/31/11 173 lb 12.8 oz (78.835 kg)  09/13/10 175 lb (79.379 kg)  02/15/10 173 lb (78.472 kg)  11/20/09 173 lb (78.472 kg)    BMI:  Body mass  index is 31.23 kg/(m^2). Class I obesity  Estimated Nutritional Needs:  Kcal:  1700-1900  Protein:  80-95 grams  Fluid:  1.7 - 1.9 L/day  Skin:  Reviewed, no issues  Diet Order:  Diet Heart Room service appropriate?: Yes; Fluid consistency:: Thin  EDUCATION NEEDS:  No education needs identified at this time   Intake/Output Summary (Last 24 hours) at 10/03/14 1221 Last data filed at 10/03/14 1100  Gross per 24 hour  Intake   1440 ml  Output   1125 ml  Net    315 ml    Last BM:  5/27  Kallie Locks, MS, RD, LDN Pager # [phone] After hours/ weekend pager # [phone]

## 2014-10-04 ENCOUNTER — Inpatient Hospital Stay (HOSPITAL_COMMUNITY): Payer: Medicare Other

## 2014-10-04 DIAGNOSIS — D649 Anemia, unspecified: Secondary | ICD-10-CM

## 2014-10-04 LAB — BASIC METABOLIC PANEL
Anion gap: 9 (ref 5–15)
BUN: 50 mg/dL — AB (ref 6–20)
CALCIUM: 8.2 mg/dL — AB (ref 8.9–10.3)
CO2: 22 mmol/L (ref 22–32)
CREATININE: 3.87 mg/dL — AB (ref 0.44–1.00)
Chloride: 112 mmol/L — ABNORMAL HIGH (ref 101–111)
GFR calc Af Amer: 12 mL/min — ABNORMAL LOW (ref >60)
GFR, EST NON AFRICAN AMERICAN: 10 mL/min — AB (ref >60)
GLUCOSE: 110 mg/dL — AB (ref 65–99)
POTASSIUM: 4.8 mmol/L (ref 3.5–5.1)
Sodium: 143 mmol/L (ref 135–145)

## 2014-10-04 LAB — CBC
HCT: 27.8 % — ABNORMAL LOW (ref 36.0–46.0)
HEMATOCRIT: 29.8 % — AB (ref 36.0–46.0)
Hemoglobin: 9.1 g/dL — ABNORMAL LOW (ref 12.0–15.0)
Hemoglobin: 9.9 g/dL — ABNORMAL LOW (ref 12.0–15.0)
MCH: 32.4 pg (ref 26.0–34.0)
MCH: 32.7 pg (ref 26.0–34.0)
MCHC: 32.7 g/dL (ref 30.0–36.0)
MCHC: 33.2 g/dL (ref 30.0–36.0)
MCV: 98.3 fL (ref 78.0–100.0)
MCV: 98.9 fL (ref 78.0–100.0)
PLATELETS: 123 10*3/uL — AB (ref 150–400)
Platelets: 124 10*3/uL — ABNORMAL LOW (ref 150–400)
RBC: 2.81 MIL/uL — ABNORMAL LOW (ref 3.87–5.11)
RBC: 3.03 MIL/uL — AB (ref 3.87–5.11)
RDW: 15.4 % (ref 11.5–15.5)
RDW: 15.5 % (ref 11.5–15.5)
WBC: 3.4 10*3/uL — ABNORMAL LOW (ref 4.0–10.5)
WBC: 3.9 10*3/uL — ABNORMAL LOW (ref 4.0–10.5)

## 2014-10-04 LAB — COMPREHENSIVE METABOLIC PANEL
ALT: 74 U/L — ABNORMAL HIGH (ref 14–54)
AST: 45 U/L — ABNORMAL HIGH (ref 15–41)
Albumin: 3 g/dL — ABNORMAL LOW (ref 3.5–5.0)
Alkaline Phosphatase: 101 U/L (ref 38–126)
Anion gap: 13 (ref 5–15)
BUN: 49 mg/dL — AB (ref 6–20)
CALCIUM: 8.2 mg/dL — AB (ref 8.9–10.3)
CO2: 19 mmol/L — ABNORMAL LOW (ref 22–32)
CREATININE: 4 mg/dL — AB (ref 0.44–1.00)
Chloride: 110 mmol/L (ref 101–111)
GFR calc Af Amer: 12 mL/min — ABNORMAL LOW (ref >60)
GFR calc non Af Amer: 10 mL/min — ABNORMAL LOW (ref >60)
GLUCOSE: 129 mg/dL — AB (ref 65–99)
POTASSIUM: 4.6 mmol/L (ref 3.5–5.1)
Sodium: 142 mmol/L (ref 135–145)
TOTAL PROTEIN: 5.8 g/dL — AB (ref 6.5–8.1)
Total Bilirubin: 1 mg/dL (ref 0.3–1.2)

## 2014-10-04 LAB — URINALYSIS, ROUTINE W REFLEX MICROSCOPIC
Bilirubin Urine: NEGATIVE
GLUCOSE, UA: NEGATIVE mg/dL
Hgb urine dipstick: NEGATIVE
KETONES UR: NEGATIVE mg/dL
Leukocytes, UA: NEGATIVE
NITRITE: NEGATIVE
PH: 5 (ref 5.0–8.0)
Protein, ur: 30 mg/dL — AB
Specific Gravity, Urine: 1.024 (ref 1.005–1.030)
Urobilinogen, UA: 0.2 mg/dL (ref 0.0–1.0)

## 2014-10-04 LAB — OCCULT BLOOD X 1 CARD TO LAB, STOOL: Fecal Occult Bld: NEGATIVE

## 2014-10-04 LAB — URINE MICROSCOPIC-ADD ON

## 2014-10-04 NOTE — Progress Notes (Signed)
Called per floor RN for Pt with periods of restlessness and lethargy following phenergan dose for nausea. Pt VSS, alert oriented x4, denies pain. Pt appears restless in my presence and request to use bathroom. Assisted to bedside commode with two assist, unsteady weak gait. Once back in be Pt appears more calm. Pt complains of a stomach "tightness". Stomach soft and non tender to touch, BS x 4. Lung sounds clear. RN advised to monitor Pt closely, if Pt condition worsens to notify my self and Provider to suggest possible labs and Provider assessment. Family at bedside update td on plan of care.

## 2014-10-04 NOTE — Progress Notes (Signed)
[facility] ROUNDING NOTE   Subjective:   Interval History:  Appears somewhat better today  Objective:  Vital signs in last 24 hours:  Temp:  [97.5 F (36.4 C)-98.5 F (36.9 C)] 98.1 F (36.7 C) (05/28 0541) Pulse Rate:  [69-78] 69 (05/28 0541) Resp:  [16-18] 16 (05/28 0541) BP: (106-145)/(36-66) 106/50 mmHg (05/28 0541) SpO2:  [95 %-100 %] 95 % (05/28 0541) Weight:  [76.34 kg (168 lb 4.8 oz)] 76.34 kg (168 lb 4.8 oz) (05/27 2029)  Weight change: -1.134 kg (-2 lb 8 oz) Filed Weights   10/02/14 0603 10/03/14 0700 10/03/14 2029  Weight: 78.9 kg (173 lb 15.1 oz) 77.474 kg (170 lb 12.8 oz) 76.34 kg (168 lb 4.8 oz)    Intake/Output: I/O last 3 completed shifts: In: 1800 [P.O.:1320; Other:480] Out: 1301 [Urine:1300; Stool:1]   Intake/Output this shift:     CVS- RRR RS- CTA ABD- BS present soft non-distended EXT- no edema   Basic Metabolic Panel:  Recent Labs Lab 09/29/14 0340  10/01/14 1300 10/01/14 2013 10/02/14 0747 10/03/14 0623 10/04/14 0002 10/04/14 0525  NA 142  < > 144  --  142 143 142 143  K 4.5  < > 4.3 4.6 4.6 4.9 4.6 4.8  CL 109  < > 110  --  109 111 110 112*  CO2 22  < > 23  --  23 23 19* 22  GLUCOSE 110*  < > 142*  --  121* 125* 129* 110*  BUN 46*  < > 46*  --  45* 46* 49* 50*  CREATININE 3.94*  < > 3.64*  --  3.74* 3.81* 4.00* 3.87*  CALCIUM 8.6*  < > 8.5*  --  8.6* 8.5* 8.2* 8.2*  MG 2.2  --   --  2.2  --   --   --   --   PHOS 5.4*  --   --   --   --  4.9*  --   --   < > = values in this interval not displayed.  Liver Function Tests:  Recent Labs Lab 09/29/14 0340 10/03/14 0623 10/04/14 0002  AST 20 65* 45*  ALT 23 85* 74*  ALKPHOS 76 103 101  BILITOT 0.8 1.1 1.0  PROT 6.0* 6.0* 5.8*  ALBUMIN 3.3* 3.1* 3.0*   No results for input(s): LIPASE, AMYLASE in the last 168 hours. No results for input(s): AMMONIA in the last 168 hours.  CBC:  Recent Labs Lab 10/01/14 0551 10/02/14 0747 10/03/14 0623 10/04/14 0002  10/04/14 0525  WBC 2.9* 4.0 3.1* 3.9* 3.4*  HGB 7.3* 10.8* 9.8* 9.9* 9.1*  HCT 22.8* 32.8* 30.0* 29.8* 27.8*  MCV 100.9* 97.6 99.0 98.3 98.9  PLT 148* 147* 130* 123* 124*    Cardiac Enzymes:  Recent Labs Lab 09/28/14 2332 09/29/14 0020 09/29/14 0340 09/29/14 1214 09/29/14 1818  TROPONINI 0.03 0.03 0.03 0.03 <0.03    BNP: Invalid input(s): POCBNP  CBG: No results for input(s): GLUCAP in the last 168 hours.  Microbiology: No results found for this or any previous visit.  Coagulation Studies: No results for input(s): LABPROT, INR in the last 72 hours.  Urinalysis:  Recent Labs  10/03/14 0804 10/04/14 0700  COLORURINE YELLOW YELLOW  LABSPEC 1.024 1.024  PHURINE 5.5 5.0  GLUCOSEU NEGATIVE NEGATIVE  HGBUR SMALL* NEGATIVE  BILIRUBINUR NEGATIVE NEGATIVE  KETONESUR NEGATIVE NEGATIVE  PROTEINUR 100* 30*  UROBILINOGEN 1.0 0.2  NITRITE NEGATIVE NEGATIVE  LEUKOCYTESUR NEGATIVE NEGATIVE      Imaging:  Dg Bone Survey Met  10/02/2014   CLINICAL DATA:  Renal failure. Clinical concern for multiple myeloma.  EXAM: METASTATIC BONE SURVEY  COMPARISON:  None.  FINDINGS: Skull:  No lytic lesions.  Cervical spine: Degenerative changes, reversal of the normal lordosis, mild anterolisthesis at the C3-4 level and mild retrolisthesis at the C4-5 level and mild anterolisthesis at the C6-7 level. No lytic lesions.  Thoracic spine: Approximately 60% T10 vertebral compression deformity with anterior and posterior sclerosis and visible fracture line anteriorly. Approximately 30% T11 vertebral compression deformity with no definite acute fracture lines seen. No lytic lesions.  Lumbar spine mild degenerative changes with no lytic lesions.  Bilateral shoulders:  No lytic lesions.  Bilateral upper arms:  No lytic lesions.  Bilateral forearms:  No lytic lesions.  Chest: Enlarged cardiac silhouette. Diffusely prominent interstitial markings with mild diffuse peribronchial thickening. No lytic lesions.   Pelvis: No lytic lesions.  Femurs: No lytic lesions.  Lower legs: No lytic lesions. Right knee degenerative changes and old lateral tibial plateau compression deformity.  Diffuse osteopenia is noted involving all of the bones.  IMPRESSION: 1. Diffuse osteopenia with no lytic lesions seen. 2. Approximately 60% T10 vertebral compression deformity with chronic and possible acute components. 3. Approximately 40% T11 vertebral compression deformity. 4. Cardiomegaly. 5. Chronic bronchitic changes with chronic interstitial lung disease. 6. Right knee degenerative changes and old lateral tibial plateau compression deformity.   Electronically Signed   By: Claudie Revering M.D.   On: 10/02/2014 15:50     Medications:     . atorvastatin  80 mg Oral QHS  . docusate sodium  100 mg Oral BID  . feeding supplement (RESOURCE BREEZE)  1 Container Oral TID BM  . latanoprost  1 drop Both Eyes QHS  . metoprolol tartrate  50 mg Oral BID  . pantoprazole  40 mg Oral Q1200  . polyethylene glycol  17 g Oral BID  . regadenoson  0.4 mg Intravenous Once  . 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 Urinalysis is pretty bland. Ultrasound shows preserved renal sizes with normal echogenicity. positive m spike suggests a paraproteinemia  Appreciate heme consult   HTN Controlled for now  Anemia Seen by heme   Positive M spike would make me suspicious for paraproteinemia  LOS: 5 Jeannette Maddy W $RemoveBefor'@[email or web address]$ '@8'$ :34 AM

## 2014-10-04 NOTE — Progress Notes (Signed)
Patient ID: [name] [name], female   DOB: 04-Jan-1941, 74 y.o.   MRN: [id number] [facility] PROGRESS NOTE  NAVA SONG JSH:[id number] DOB: [birth year] DOA: 09/28/2014 PCP: Neale Burly, MD  Brief narrative:    74 year old female with past medical history of chronic diastolic CHF, hypertrophy cardiomyopathy, extensive cardiac work up in past including right and left heart catheterization in 2010 (with 90% LAD lesion which was stented), COPD, hypertension, recent hospitalization in [facility] hospital for acute diastolic CHF where she underwent pretty aggressive diureses. Patient now presented to [facility] ED with intermittent chest tightness, cough ever since discharge from [facility].   On this admission, she was found to have acute renal failure with creatinine level in 4 range (her baseline is normal creatinine). Initial troponin was WNL. Cardio has seen in her consultation and plans for stress test. Of note, CXR on admission showed possible early pulmonary edema.    Assessment/Plan:    Acute renal failure - Unclear etiology -  Value and on this admission 4.1 - cr fluctuates 3.6 to 3.8 -SPEP, UPEP pending. Mild M spike rest of test pending.  -Renal US; No evidence of hydronephrosis or bladder distention. Right nephrolithiasis.  -UA 30 gr protein.  -Appreciate nephrology evaluation. hemodynamic vs dysproteinemia.  -Light chain and ANCA pending.   Anemia, pancytopenia:  Patient denies melena, hematochezia. She had colonoscopy 2014.  Last hb 5 years ago was at 16. Anemia panel: normal B-12, iron ferritin.  peripheral smear negative for schitocytes. UPEP, SPEP pending, Mild M spike.  Hematology  Oncology consulted, and appreciate help.  occult blood negative S/P  2 PRBC.  Bone survey only showed osteopenia. Prior compression fracture. Patient was aware of compression fracture.  Drops of blood in the toilet, patient think came from infection needle in her abdomen.  Transfuse  for hb less than 8  Nausea, Abdominal distension; Today denies abdominal pain.  Check KUB.   Encephalopathy:  Became confuse, lethargic after phenergan. Avoid Phenergan.   Acute on chronic diastolic heart failure - Pt with history of diastolic CHF - 2 D ECHO on this admission showed normal EF but septal hypertrophy - BNP 597 on this admission - Weight since admission, 77 kg --> 78.9 kg - Will follow up with cardio about CHF management. Pt currently not on lasix due to acute renal failure. -Continue daily weight and strict intake and output  -Holding lasix due to renal failure.    Chest pain - In pt with extensive cardiac history and work up, concern is always for ACS - Her troponin levels remain WNL - Continue aspirin, Lipitor and metoprolol  - Cardiology sign off,  appreciate their recommendations  -NST; Normal study with normal perfusion. Low risk test. EF 65%.  Essential hypertension - Continue metoprolol 25 mg PO BID  Dyslipidemia - Continue statin therapy   DVT Prophylaxis  -scds.   Code Status: Full.  Family Communication:  plan of care discussed with the patient and daughter in law at bedside.  Disposition Plan: Home once renal function, and anemia sable. Work up in process.   IV access:  Peripheral IV  Procedures and diagnostic studies:    Ct Chest Wo Contrast 10-21-14 Patchy ground-glass airspace opacity with interlobular septal thickening and bronchial wall thickening most compatible with early pulmonary edema.  Moderate cardiomegaly.   Electronically Signed   By: Conchita Paris M.D.   On: 10-21-2014 10:42   US Renal 10-21-14  Bilateral simple renal cysts. Right nephrolithiasis. Exam is  otherwise normal. No evidence of hydronephrosis or bladder distention.   Electronically Signed   By: [place]   On: 09/29/2014 10:14   Nm Myocar Multi W/spect Tamela Oddi Motion / Ef 09/30/2014  Dg Chest Port 1 View 09/28/2014  1.  No acute cardiopulmonary disease. 2.  Grossly unchanged bibasilar atelectasis/scar, left greater than right.     Medical Consultants:  Cardiology  Oncology.   Other Consultants:  None   IAnti-Infectives:      Elmarie Shiley, MD  [facility] Pager [phone]  Time spent in minutes: 25 minutes  If 7PM-7AM, please contact night-coverage [email or web address] Password [facility] 10/04/2014, 12:51 PM   LOS: 5 days    HPI/Subjective: She has been having nausea. Denies constipation.  she is breathing better.   Objective: Filed Vitals:   10/03/14 2239 10/04/14 0541 10/04/14 1032 10/04/14 1042  BP: 145/48 106/50 133/42 126/45  Pulse: 78 69 80 79  Temp: 97.5 F (36.4 C) 98.1 F (36.7 C)    TempSrc: Oral Oral    Resp: 18 16 16    Height:      Weight:      SpO2: 98% 95% 98%     Intake/Output Summary (Last 24 hours) at 10/04/14 1251 Last data filed at 10/04/14 1015  Gross per 24 hour  Intake    960 ml  Output    851 ml  Net    109 ml    Exam:   General:  Pt is alert, follows commands appropriately, not in acute distress  Cardiovascular: Regular rate and rhythm, S1/S2 (+)  Respiratory: diminished breath sounds bilaterally, no wheezing, no crackles, no rhonchi  Abdomen: Soft, non tender, non distended, bowel sounds present  Extremities: No edema, pulses DP and PT palpable bilaterally  Neuro: Grossly nonfocal  Data Reviewed: Basic Metabolic Panel:  Recent Labs Lab 09/29/14 0340  10/01/14 1300 10/01/14 2013 10/02/14 0747 10/03/14 0623 10/04/14 0002 10/04/14 0525  NA 142  < > 144  --  142 143 142 143  K 4.5  < > 4.3 4.6 4.6 4.9 4.6 4.8  CL 109  < > 110  --  109 111 110 112*  CO2 22  < > 23  --  23 23 19* 22  GLUCOSE 110*  < > 142*  --  121* 125* 129* 110*  BUN 46*  < > 46*  --  45* 46* 49* 50*  CREATININE 3.94*  < > 3.64*  --  3.74* 3.81* 4.00* 3.87*  CALCIUM 8.6*  < > 8.5*  --  8.6* 8.5* 8.2* 8.2*  MG 2.2  --   --  2.2  --   --   --   --   PHOS 5.4*  --   --   --   --  4.9*  --   --   <  > = values in this interval not displayed. Liver Function Tests:  Recent Labs Lab 09/29/14 0340 10/03/14 0623 10/04/14 0002  AST 20 65* 45*  ALT 23 85* 74*  ALKPHOS 76 103 101  BILITOT 0.8 1.1 1.0  PROT 6.0* 6.0* 5.8*  ALBUMIN 3.3* 3.1* 3.0*   No results for input(s): LIPASE, AMYLASE in the last 168 hours. No results for input(s): AMMONIA in the last 168 hours. CBC:  Recent Labs Lab 10/01/14 0551 10/02/14 0747 10/03/14 0623 10/04/14 0002 10/04/14 0525  WBC 2.9* 4.0 3.1* 3.9* 3.4*  HGB 7.3* 10.8* 9.8* 9.9* 9.1*  HCT 22.8* 32.8* 30.0* 29.8* 27.8*  MCV  100.9* 97.6 99.0 98.3 98.9  PLT 148* 147* 130* 123* 124*   Cardiac Enzymes:  Recent Labs Lab 09/28/14 2332 09/29/14 0020 09/29/14 0340 09/29/14 1214 09/29/14 1818  TROPONINI 0.03 0.03 0.03 0.03 <0.03   BNP: Invalid input(s): POCBNP CBG: No results for input(s): GLUCAP in the last 168 hours.  No results found for this or any previous visit (from the past 240 hour(s)).   Scheduled Meds: . aspirin EC  81 mg Oral Daily  . atorvastatin  80 mg Oral QHS  . doxycycline  100 mg Oral Q12H  . heparin  5,000 Units Subcutaneous 3 times per day  . metoprolol tartrate  25 mg Oral BID  . pantoprazole  40 mg Oral Q1200

## 2014-10-05 LAB — BASIC METABOLIC PANEL
ANION GAP: 10 (ref 5–15)
BUN: 52 mg/dL — AB (ref 6–20)
CO2: 22 mmol/L (ref 22–32)
Calcium: 8.3 mg/dL — ABNORMAL LOW (ref 8.9–10.3)
Chloride: 112 mmol/L — ABNORMAL HIGH (ref 101–111)
Creatinine, Ser: 3.78 mg/dL — ABNORMAL HIGH (ref 0.44–1.00)
GFR calc Af Amer: 12 mL/min — ABNORMAL LOW (ref >60)
GFR calc non Af Amer: 11 mL/min — ABNORMAL LOW (ref >60)
Glucose, Bld: 138 mg/dL — ABNORMAL HIGH (ref 65–99)
Potassium: 4.6 mmol/L (ref 3.5–5.1)
Sodium: 144 mmol/L (ref 135–145)

## 2014-10-05 LAB — CBC
HEMATOCRIT: 28.2 % — AB (ref 36.0–46.0)
Hemoglobin: 9.4 g/dL — ABNORMAL LOW (ref 12.0–15.0)
MCH: 33.2 pg (ref 26.0–34.0)
MCHC: 33.3 g/dL (ref 30.0–36.0)
MCV: 99.6 fL (ref 78.0–100.0)
PLATELETS: 119 10*3/uL — AB (ref 150–400)
RBC: 2.83 MIL/uL — AB (ref 3.87–5.11)
RDW: 15.2 % (ref 11.5–15.5)
WBC: 3.7 10*3/uL — AB (ref 4.0–10.5)

## 2014-10-05 LAB — HEPATIC FUNCTION PANEL
ALT: 68 U/L — ABNORMAL HIGH (ref 14–54)
AST: 39 U/L (ref 15–41)
Albumin: 3.2 g/dL — ABNORMAL LOW (ref 3.5–5.0)
Alkaline Phosphatase: 115 U/L (ref 38–126)
BILIRUBIN INDIRECT: 0.7 mg/dL (ref 0.3–0.9)
Bilirubin, Direct: 0.2 mg/dL (ref 0.1–0.5)
Total Bilirubin: 0.9 mg/dL (ref 0.3–1.2)
Total Protein: 6.4 g/dL — ABNORMAL LOW (ref 6.5–8.1)

## 2014-10-05 LAB — KAPPA/LAMBDA LIGHT CHAINS
KAPPA, LAMDA LIGHT CHAIN RATIO: 0 — AB (ref 0.26–1.65)
Kappa free light chain: 8.39 mg/L (ref 3.30–19.40)
Lambda free light chains: 9830 mg/L — ABNORMAL HIGH (ref 5.71–26.30)

## 2014-10-05 LAB — MPO/PR-3 (ANCA) ANTIBODIES

## 2014-10-05 MED ORDER — PANTOPRAZOLE SODIUM 40 MG PO TBEC
40.0000 mg | DELAYED_RELEASE_TABLET | Freq: Two times a day (BID) | ORAL | Status: DC
  Administered 2014-10-05 – 2014-10-10 (×11): 40 mg via ORAL
  Filled 2014-10-05 (×7): qty 1

## 2014-10-05 NOTE — Progress Notes (Signed)
[facility] ROUNDING NOTE   Subjective:   Interval History: no complaints  Family in room  Objective:  Vital signs in last 24 hours:  Temp:  [98 F (36.7 C)-98.9 F (37.2 C)] 98.2 F (36.8 C) (05/29 0826) Pulse Rate:  [76-85] 78 (05/29 0826) Resp:  [15-18] 18 (05/29 0826) BP: (115-137)/(36-65) 123/65 mmHg (05/29 0826) SpO2:  [97 %-100 %] 99 % (05/29 0826) Weight:  [76.21 kg (168 lb 0.2 oz)] 76.21 kg (168 lb 0.2 oz) (05/29 0500)  Weight change: -0.13 kg (-4.6 oz) Filed Weights   10/03/14 0700 10/03/14 2029 10/05/14 0500  Weight: 77.474 kg (170 lb 12.8 oz) 76.34 kg (168 lb 4.8 oz) 76.21 kg (168 lb 0.2 oz)    Intake/Output: I/O last 3 completed shifts: In: 1680 [P.O.:1680] Out: 1401 [Urine:1400; Stool:1]   Intake/Output this shift:  Total I/O In: 120 [P.O.:120] Out: 600 [Urine:600]  CVS- RRR RS- CTA ABD- BS present soft non-distended EXT- no edema   Basic Metabolic Panel:  Recent Labs Lab 09/29/14 0340  10/01/14 2013 10/02/14 0747 10/03/14 0623 10/04/14 0002 10/04/14 0525 10/05/14 0724  NA 142  < >  --  142 143 142 143 144  K 4.5  < > 4.6 4.6 4.9 4.6 4.8 4.6  CL 109  < >  --  109 111 110 112* 112*  CO2 22  < >  --  23 23 19* 22 22  GLUCOSE 110*  < >  --  121* 125* 129* 110* 138*  BUN 46*  < >  --  45* 46* 49* 50* 52*  CREATININE 3.94*  < >  --  3.74* 3.81* 4.00* 3.87* 3.78*  CALCIUM 8.6*  < >  --  8.6* 8.5* 8.2* 8.2* 8.3*  MG 2.2  --  2.2  --   --   --   --   --   PHOS 5.4*  --   --   --  4.9*  --   --   --   < > = values in this interval not displayed.  Liver Function Tests:  Recent Labs Lab 09/29/14 0340 10/03/14 0623 10/04/14 0002  AST 20 65* 45*  ALT 23 85* 74*  ALKPHOS 76 103 101  BILITOT 0.8 1.1 1.0  PROT 6.0* 6.0* 5.8*  ALBUMIN 3.3* 3.1* 3.0*   No results for input(s): LIPASE, AMYLASE in the last 168 hours. No results for input(s): AMMONIA in the last 168 hours.  CBC:  Recent Labs Lab 10/02/14 0747 10/03/14 0623  10/04/14 0002 10/04/14 0525 10/05/14 0724  WBC 4.0 3.1* 3.9* 3.4* 3.7*  HGB 10.8* 9.8* 9.9* 9.1* 9.4*  HCT 32.8* 30.0* 29.8* 27.8* 28.2*  MCV 97.6 99.0 98.3 98.9 99.6  PLT 147* 130* 123* 124* 119*    Cardiac Enzymes:  Recent Labs Lab 09/28/14 2332 09/29/14 0020 09/29/14 0340 09/29/14 1214 09/29/14 1818  TROPONINI 0.03 0.03 0.03 0.03 <0.03    BNP: Invalid input(s): POCBNP  CBG: No results for input(s): GLUCAP in the last 168 hours.  Microbiology: No results found for this or any previous visit.  Coagulation Studies: No results for input(s): LABPROT, INR in the last 72 hours.  Urinalysis:  Recent Labs  10/03/14 0804 10/04/14 0700  COLORURINE YELLOW YELLOW  LABSPEC 1.024 1.024  PHURINE 5.5 5.0  GLUCOSEU NEGATIVE NEGATIVE  HGBUR SMALL* NEGATIVE  BILIRUBINUR NEGATIVE NEGATIVE  KETONESUR NEGATIVE NEGATIVE  PROTEINUR 100* 30*  UROBILINOGEN 1.0 0.2  NITRITE NEGATIVE NEGATIVE  LEUKOCYTESUR NEGATIVE NEGATIVE  Imaging: Dg Abd 1 View  10/04/2014   CLINICAL DATA:  Abdominal distention  EXAM: ABDOMEN - 1 VIEW  COMPARISON:  None.  FINDINGS: Bilateral re- opacities over the kidneys are most compatible with renal calculi. Normal bowel gas pattern. Mild stool burden. No acute osseous abnormality.  IMPRESSION: Bilateral renal calculi.  Normal bowel gas pattern.   Electronically Signed   By: Conchita Paris M.D.   On: 10/04/2014 12:24     Medications:     . atorvastatin  80 mg Oral QHS  . docusate sodium  100 mg Oral BID  . feeding supplement (RESOURCE BREEZE)  1 Container Oral TID BM  . latanoprost  1 drop Both Eyes QHS  . metoprolol tartrate  50 mg Oral BID  . pantoprazole  40 mg Oral BID  . polyethylene glycol  17 g Oral BID  . regadenoson  0.4 mg Intravenous Once  . 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 Urinalysis is pretty bland. Ultrasound shows preserved renal sizes with normal echogenicity. positive m spike suggests a paraproteinemia Appreciate heme consult   HTN Controlled for now  Anemia Seen by heme   Positive M spike would make me suspicious for paraproteinemia    LOS: 6 [name] [name] W @TODAY @1 :04 PM

## 2014-10-05 NOTE — Progress Notes (Signed)
Patient ID: [name] [name], female   DOB: February 13, 1941, 74 y.o.   MRN: [id number] [facility] PROGRESS NOTE  [name] [name] [id number]:[id number] DOB: 04/25/1941 DOA: 09/28/2014 PCP: Neale Burly, MD  Brief narrative:    74 year old female with past medical history of chronic diastolic CHF, hypertrophy cardiomyopathy, extensive cardiac work up in past including right and left heart catheterization in 2010 (with 90% LAD lesion which was stented), COPD, hypertension, recent hospitalization in [facility] hospital for acute diastolic CHF where she underwent pretty aggressive diureses. Patient now presented to [facility] ED with intermittent chest tightness, cough ever since discharge from [facility].   On this admission, she was found to have acute renal failure with creatinine level in 4 range (her baseline is normal creatinine). Initial troponin was WNL. Cardio has seen in her consultation and plans for stress test. Of note, CXR on admission showed possible early pulmonary edema.    Assessment/Plan:    Acute renal failure - Unclear etiology - Value and on this admission 4.1 - cr fluctuates 3.6 to 3.8 -SPEP, UPEP pending. Mild M spike rest of test pending.  -Renal US; No evidence of hydronephrosis or bladder distention. Right nephrolithiasis.  -UA 30 gr protein.  -Appreciate nephrology evaluation. hemodynamic vs dysproteinemia.  -Light chain and ANCA pending.   Anemia, pancytopenia:  Patient denies melena, hematochezia. She had colonoscopy 2014.  Last hb 5 years ago was at 30. Anemia panel: normal B-12, iron ferritin.  peripheral smear negative for schitocytes. UPEP, SPEP pending, Mild M spike.  Hematology  Oncology consulted, and appreciate help.  occult blood negative S/P  2 PRBC.  Bone survey only showed osteopenia. Prior compression fracture. Patient was aware of compression fracture.  occult blood negative.  Transfuse for hb less than 8 Check erythropoietin level.  Nausea, KUB  negative.  Avoid phenergan. Due to adverse effect Continue with Zofran,.  Check LFT  Encephalopathy:  Became confuse, lethargic after phenergan. Avoid Phenergan.  Back to baseline.   Acute on chronic diastolic heart failure - Pt with history of diastolic CHF - 2 D ECHO on this admission showed normal EF but septal hypertrophy - BNP 597 on this admission - Weight since admission, 77 kg --> 78.9 kg - Will follow up with cardio about CHF management. Pt currently not on lasix due to acute renal failure. -Continue daily weight and strict intake and output  -Holding lasix due to renal failure.    Chest pain - In pt with extensive cardiac history and work up, concern is always for ACS - Her troponin levels remain WNL - Continue aspirin, Lipitor and metoprolol  - Cardiology sign off,  appreciate their recommendations  -NST; Normal study with normal perfusion. Low risk test. EF 65%.  Essential hypertension - Continue metoprolol 25 mg PO BID  Dyslipidemia - Continue statin therapy   DVT Prophylaxis  -scds.   Code Status: Full.  Family Communication:  plan of care discussed with the patient and daughter in law and son  at bedside.  Disposition Plan: awaiting evaluation for anemia and renal failure. Await SPEP UPEP./   IV access:  Peripheral IV  Procedures and diagnostic studies:    Ct Chest Wo Contrast 2014/10/04 Patchy ground-glass airspace opacity with interlobular septal thickening and bronchial wall thickening most compatible with early pulmonary edema.  Moderate cardiomegaly.   Electronically Signed   By: Conchita Paris M.D.   On: 2014-10-04 10:42   US Renal 04-Oct-2014  Bilateral simple renal cysts. Right nephrolithiasis. Exam  is otherwise normal. No evidence of hydronephrosis or bladder distention.   Electronically Signed   By: [place]   On: 09/29/2014 10:14   Nm Myocar Multi W/spect Tamela Oddi Motion / Ef 09/30/2014  Dg Chest Port 1 View 09/28/2014  1.  No acute  cardiopulmonary disease. 2. Grossly unchanged bibasilar atelectasis/scar, left greater than right.     Medical Consultants:  Cardiology  Oncology.   Other Consultants:  None   IAnti-Infectives:      Elmarie Shiley, MD  [facility] Pager [phone]  Time spent in minutes: 25 minutes  If 7PM-7AM, please contact night-coverage [email or web address] Password [facility] 10/05/2014, 11:36 AM   LOS: 6 days    HPI/Subjective: Still with nausea, had BM Denies abdominal pain. No eating well.   Objective: Filed Vitals:   10/04/14 2100 10/05/14 0500 10/05/14 0542 10/05/14 0826  BP: 137/47  132/36 123/65  Pulse: 85  77 78  Temp: 98.9 F (37.2 C)  98.2 F (36.8 C) 98.2 F (36.8 C)  TempSrc: Oral  Oral Oral  Resp: 16  18 18   Height:      Weight:  76.21 kg (168 lb 0.2 oz)    SpO2: 98%  100% 99%    Intake/Output Summary (Last 24 hours) at 10/05/14 1136 Last data filed at 10/05/14 1114  Gross per 24 hour  Intake   1320 ml  Output   1576 ml  Net   -256 ml    Exam:   General:  Pt is alert, follows commands appropriately, not in acute distress  Cardiovascular: Regular rate and rhythm, S1/S2 (+)  Respiratory: diminished breath sounds bilaterally, no wheezing, no crackles, no rhonchi  Abdomen: Soft, non tender, non distended, bowel sounds present  Extremities: No edema, pulses DP and PT palpable bilaterally  Data Reviewed: Basic Metabolic Panel:  Recent Labs Lab 09/29/14 0340  10/01/14 2013 10/02/14 0747 10/03/14 0623 10/04/14 0002 10/04/14 0525 10/05/14 0724  NA 142  < >  --  142 143 142 143 144  K 4.5  < > 4.6 4.6 4.9 4.6 4.8 4.6  CL 109  < >  --  109 111 110 112* 112*  CO2 22  < >  --  23 23 19* 22 22  GLUCOSE 110*  < >  --  121* 125* 129* 110* 138*  BUN 46*  < >  --  45* 46* 49* 50* 52*  CREATININE 3.94*  < >  --  3.74* 3.81* 4.00* 3.87* 3.78*  CALCIUM 8.6*  < >  --  8.6* 8.5* 8.2* 8.2* 8.3*  MG 2.2  --  2.2  --   --   --   --   --   PHOS 5.4*  --   --    --  4.9*  --   --   --   < > = values in this interval not displayed. Liver Function Tests:  Recent Labs Lab 09/29/14 0340 10/03/14 0623 10/04/14 0002  AST 20 65* 45*  ALT 23 85* 74*  ALKPHOS 76 103 101  BILITOT 0.8 1.1 1.0  PROT 6.0* 6.0* 5.8*  ALBUMIN 3.3* 3.1* 3.0*   No results for input(s): LIPASE, AMYLASE in the last 168 hours. No results for input(s): AMMONIA in the last 168 hours. CBC:  Recent Labs Lab 10/02/14 0747 10/03/14 0623 10/04/14 0002 10/04/14 0525 10/05/14 0724  WBC 4.0 3.1* 3.9* 3.4* 3.7*  HGB 10.8* 9.8* 9.9* 9.1* 9.4*  HCT 32.8* 30.0* 29.8* 27.8* 28.2*  MCV 97.6 99.0 98.3 98.9 99.6  PLT 147* 130* 123* 124* 119*   Cardiac Enzymes:  Recent Labs Lab 09/28/14 2332 09/29/14 0020 09/29/14 0340 09/29/14 1214 09/29/14 1818  TROPONINI 0.03 0.03 0.03 0.03 <0.03   BNP: Invalid input(s): POCBNP CBG: No results for input(s): GLUCAP in the last 168 hours.  No results found for this or any previous visit (from the past 240 hour(s)).   Scheduled Meds: . aspirin EC  81 mg Oral Daily  . atorvastatin  80 mg Oral QHS  . doxycycline  100 mg Oral Q12H  . heparin  5,000 Units Subcutaneous 3 times per day  . metoprolol tartrate  25 mg Oral BID  . pantoprazole  40 mg Oral Q1200

## 2014-10-06 DIAGNOSIS — C9000 Multiple myeloma not having achieved remission: Secondary | ICD-10-CM

## 2014-10-06 DIAGNOSIS — E859 Amyloidosis, unspecified: Secondary | ICD-10-CM

## 2014-10-06 LAB — BASIC METABOLIC PANEL
Anion gap: 11 (ref 5–15)
BUN: 51 mg/dL — ABNORMAL HIGH (ref 6–20)
CALCIUM: 8.5 mg/dL — AB (ref 8.9–10.3)
CHLORIDE: 113 mmol/L — AB (ref 101–111)
CO2: 22 mmol/L (ref 22–32)
Creatinine, Ser: 3.45 mg/dL — ABNORMAL HIGH (ref 0.44–1.00)
GFR calc Af Amer: 14 mL/min — ABNORMAL LOW (ref >60)
GFR calc non Af Amer: 12 mL/min — ABNORMAL LOW (ref >60)
Glucose, Bld: 162 mg/dL — ABNORMAL HIGH (ref 65–99)
Potassium: 4.8 mmol/L (ref 3.5–5.1)
Sodium: 146 mmol/L — ABNORMAL HIGH (ref 135–145)

## 2014-10-06 LAB — UIFE/LIGHT CHAINS/TP QN, 24-HR UR
% BETA, URINE: 2.8 %
ALPHA 1 URINE: 0.5 %
Albumin, U: 5.3 %
Alpha 2, Urine: 1.6 %
Free Kappa/Lambda Ratio: <0.05 — ABNORMAL LOW (ref 2.04–10.37)
Free Lt Chn Excr Rate: 34.9 mg/L — ABNORMAL HIGH (ref 1.35–24.19)
GAMMA GLOBULIN URINE: 89.9 %
M-SPIKE %, Urine: 86.9 % — ABNORMAL HIGH
TOTAL PROTEIN, URINE-UPE24: 2078.5 mg/dL

## 2014-10-06 LAB — CBC
HEMATOCRIT: 30.6 % — AB (ref 36.0–46.0)
Hemoglobin: 9.6 g/dL — ABNORMAL LOW (ref 12.0–15.0)
MCH: 31.7 pg (ref 26.0–34.0)
MCHC: 31.4 g/dL (ref 30.0–36.0)
MCV: 101 fL — ABNORMAL HIGH (ref 78.0–100.0)
Platelets: 119 10*3/uL — ABNORMAL LOW (ref 150–400)
RBC: 3.03 MIL/uL — AB (ref 3.87–5.11)
RDW: 15.3 % (ref 11.5–15.5)
WBC: 4.5 10*3/uL (ref 4.0–10.5)

## 2014-10-06 LAB — PROTIME-INR
INR: 1.26 (ref 0.00–1.49)
Prothrombin Time: 16 seconds — ABNORMAL HIGH (ref 11.6–15.2)

## 2014-10-06 LAB — APTT: aPTT: 36 seconds (ref 24–37)

## 2014-10-06 MED ORDER — SODIUM CHLORIDE 0.9 % IV SOLN
510.0000 mg | Freq: Once | INTRAVENOUS | Status: AC
  Administered 2014-10-06: 510 mg via INTRAVENOUS
  Filled 2014-10-06: qty 17

## 2014-10-06 NOTE — Progress Notes (Signed)
Patient ID: [name] [name], female   DOB: [birth year], 74 y.o.   MRN: [id number] [facility] PROGRESS NOTE  [name] [name] BPZ:[id number] DOB: 1940-07-20 DOA: 09/28/2014 PCP: [name] Burly, MD  Brief narrative:    74 year old female with past medical history of chronic diastolic CHF, hypertrophy cardiomyopathy, extensive cardiac work up in past including right and left heart catheterization in 2010 (with 90% LAD lesion which was stented), COPD, hypertension, recent hospitalization in [facility] hospital for acute diastolic CHF where she underwent pretty aggressive diureses. Patient now presented to [facility] ED with intermittent chest tightness, cough ever since discharge from [facility].   On this admission, she was found to have acute renal failure with creatinine level in 4 range (her baseline is normal creatinine). Initial troponin was WNL. Cardio has seen in her consultation and plans for stress test. Of note, CXR on admission showed possible early pulmonary edema.    Assessment/Plan:    Acute renal failure - Unclear etiology - Value and on this admission 4.1 - cr fluctuates 3.6 to 3.8 -SPEP, UPEP: Lambda light chain 9830. Mild M spike -Renal US; No evidence of hydronephrosis or bladder distention. Right nephrolithiasis.  -UA 30 gr protein.  -Appreciate nephrology evaluation. hemodynamic vs dysproteinemia.  -Light chain and ANCA pending.   Anemia, pancytopenia: Possible plasma cell Disorder.  Patient denies melena, hematochezia. She had colonoscopy 2014.  Last hb 5 years ago was at 67. Anemia panel: normal B-12, iron ferritin.  peripheral smear negative for schitocytes. UPEP, SPEP: Lambda light chain 9830.   Mild M spike.  Hematology  Oncology consulted, and appreciate help.  occult blood negative S/P  2 PRBC.  Bone survey only showed osteopenia. Prior compression fracture. Patient was aware of compression fracture.  occult blood negative.  Transfuse for hb less than  8 erythropoietin level pending.  Bone Marrow biopsy 5-31.  Nausea, improved KUB negative.  Avoid phenergan. Due to adverse effect Continue with Zofran,.  LFT trending down.   Encephalopathy:  Became confuse, lethargic after phenergan. Avoid Phenergan.  Back to baseline.   Acute on chronic diastolic heart failure - Pt with history of diastolic CHF - 2 D ECHO on this admission showed normal EF but septal hypertrophy - BNP 597 on this admission - Weight since admission, 77 kg --> 78.9 kg - Will follow up with cardio about CHF management. Pt currently not on lasix due to acute renal failure. -Continue daily weight and strict intake and output  -Holding lasix due to renal failure.    Chest pain - In pt with extensive cardiac history and work up, concern is always for ACS - Her troponin levels remain WNL - Continue aspirin, Lipitor and metoprolol  - Cardiology sign off,  appreciate their recommendations  -NST; Normal study with normal perfusion. Low risk test. EF 65%.  Essential hypertension - Continue metoprolol 25 mg PO BID  Dyslipidemia - Continue statin therapy   DVT Prophylaxis  -scds.   Code Status: Full.  Family Communication:  plan of care discussed with the patient and daughter in law and son  at bedside.  Disposition Plan: discharge after bone marrow biopsy.   IV access:  Peripheral IV  Procedures and diagnostic studies:    Ct Chest Wo Contrast 10-27-14 Patchy ground-glass airspace opacity with interlobular septal thickening and bronchial wall thickening most compatible with early pulmonary edema.  Moderate cardiomegaly.   Electronically Signed   By: [name] [name] M.D.   On: 10/27/14 10:42   US  Renal 09/29/2014  Bilateral simple renal cysts. Right nephrolithiasis. Exam is otherwise normal. No evidence of hydronephrosis or bladder distention.   Electronically Signed   By: [name] [name]   On: 09/29/2014 10:14   Nm Myocar Multi W/spect [name] [name] Motion / Ef  09/30/2014  Dg Chest Port 1 View 09/28/2014  1.  No acute cardiopulmonary disease. 2. Grossly unchanged bibasilar atelectasis/scar, left greater than right.     Medical Consultants:  Cardiology  Oncology.   Other Consultants:  None   IAnti-Infectives:      [name] Shiley, MD  [facility] Pager [phone]  Time spent in minutes: 25 minutes  If 7PM-7AM, please contact night-coverage [email or web address] Password [email or web address] 10/06/2014, 1:07 PM   LOS: 7 days    HPI/Subjective: Feeling ok, nausea has improved  Objective: Filed Vitals:   10/05/14 1718 10/05/14 2100 10/06/14 0500 10/06/14 0803  BP: 111/42 124/42 125/45 123/55  Pulse: 69 76 79 81  Temp: 99.1 F (37.3 C) 98.7 F (37.1 C) 97.7 F (36.5 C) 99 F (37.2 C)  TempSrc: Oral Oral Oral Oral  Resp: $Remo'18 18 18 17  'UJNuv$ Height:      Weight:  77.883 kg (171 lb 11.2 oz) 77.88 kg (171 lb 11.1 oz)   SpO2: 97% 95% 100% 96%    Intake/Output Summary (Last 24 hours) at 10/06/14 1307 Last data filed at 10/06/14 1209  Gross per 24 hour  Intake   1182 ml  Output   1250 ml  Net    -68 ml    Exam:   General:  Pt is alert, follows commands appropriately, not in acute distress  Cardiovascular: Regular rate and rhythm, S1/S2 (+)  Respiratory: diminished breath sounds bilaterally, no wheezing, no crackles, no rhonchi  Abdomen: Soft, non tender, non distended, bowel sounds present  Extremities: No edema, pulses DP and PT palpable bilaterally  Data Reviewed: Basic Metabolic Panel:  Recent Labs Lab 10/01/14 2013 10/02/14 0747 10/03/14 0623 10/04/14 0002 10/04/14 0525 10/05/14 0724  NA  --  142 143 142 143 144  K 4.6 4.6 4.9 4.6 4.8 4.6  CL  --  109 111 110 112* 112*  CO2  --  23 23 19* 22 22  GLUCOSE  --  121* 125* 129* 110* 138*  BUN  --  45* 46* 49* 50* 52*  CREATININE  --  3.74* 3.81* 4.00* 3.87* 3.78*  CALCIUM  --  8.6* 8.5* 8.2* 8.2* 8.3*  MG 2.2  --   --   --   --   --   PHOS  --   --  4.9*  --   --   --     Liver Function Tests:  Recent Labs Lab 10/03/14 0623 10/04/14 0002 10/05/14 1220  AST 65* 45* 39  ALT 85* 74* 68*  ALKPHOS 103 101 115  BILITOT 1.1 1.0 0.9  PROT 6.0* 5.8* 6.4*  ALBUMIN 3.1* 3.0* 3.2*   No results for input(s): LIPASE, AMYLASE in the last 168 hours. No results for input(s): AMMONIA in the last 168 hours. CBC:  Recent Labs Lab 10/03/14 0623 10/04/14 0002 10/04/14 0525 10/05/14 0724 10/06/14 0944  WBC 3.1* 3.9* 3.4* 3.7* 4.5  HGB 9.8* 9.9* 9.1* 9.4* 9.6*  HCT 30.0* 29.8* 27.8* 28.2* 30.6*  MCV 99.0 98.3 98.9 99.6 101.0*  PLT 130* 123* 124* 119* 119*   Cardiac Enzymes:  Recent Labs Lab 09/29/14 1818  TROPONINI <0.03   BNP: Invalid input(s): POCBNP CBG: No results for input(s): GLUCAP in  the last 168 hours.  No results found for this or any previous visit (from the past 240 hour(s)).   Scheduled Meds: . aspirin EC  81 mg Oral Daily  . atorvastatin  80 mg Oral QHS  . doxycycline  100 mg Oral Q12H  . heparin  5,000 Units Subcutaneous 3 times per day  . metoprolol tartrate  25 mg Oral BID  . pantoprazole  40 mg Oral Q1200

## 2014-10-06 NOTE — Progress Notes (Signed)
IP PROGRESS NOTE  Subjective:   Events noted in the last few days. Patient sitting in a chair without any distress. She was able to ambulate short distances. He reports some occasional nausea and dyspnea on exertion. Has not reported any bone pain or discomfort.  Objective:  Vital signs in last 24 hours: Temp:  [97.7 F (36.5 C)-99.1 F (37.3 C)] 97.7 F (36.5 C) (05/30 0500) Pulse Rate:  [69-79] 79 (05/30 0500) Resp:  [18] 18 (05/30 0500) BP: (111-125)/(42-65) 125/45 mmHg (05/30 0500) SpO2:  [95 %-100 %] 100 % (05/30 0500) Weight:  [171 lb 11.1 oz (77.88 kg)-171 lb 11.2 oz (77.883 kg)] 171 lb 11.1 oz (77.88 kg) (05/30 0500) Weight change: 3 lb 11 oz (1.673 kg) Last BM Date: 10/04/14  Intake/Output from previous day: 05/29 0701 - 05/30 0700 In: 840 [P.O.:840] Out: 800 [Urine:800]  Mouth: mucous membranes moist, pharynx normal without lesions Resp: clear to auscultation bilaterally Cardio: regular rate and rhythm, S1, S2 normal, no murmur, click, rub or gallop GI: soft, non-tender; bowel sounds normal; no masses,  no organomegaly Extremities: extremities normal, atraumatic, no cyanosis or edema    Lab Results:  Recent Labs  10/04/14 0525 10/05/14 0724  WBC 3.4* 3.7*  HGB 9.1* 9.4*  HCT 27.8* 28.2*  PLT 124* 119*    BMET  Recent Labs  10/04/14 0525 10/05/14 0724  NA 143 144  K 4.8 4.6  CL 112* 112*  CO2 22 22  GLUCOSE 110* 138*  BUN 50* 52*  CREATININE 3.87* 3.78*  CALCIUM 8.2* 8.3*     Results for [name], [name] (MRN [id number]) as of 10/06/2014 07:58  Ref. Range 10/03/2014 06:23  Kappa free light chain Latest Ref Range: 3.30-19.40 mg/L 8.39  Lamda free light chains Latest Ref Range: 5.71-26.30 mg/L 9830.00 (H)  Kappa, lamda light chain ratio Latest Ref Range: 0.26-1.65  0.00 (L)    Studies/Results: Dg Abd 1 View  10/04/2014   CLINICAL DATA:  Abdominal distention  EXAM: ABDOMEN - 1 VIEW  COMPARISON:  None.  FINDINGS: Bilateral re- opacities over  the kidneys are most compatible with renal calculi. Normal bowel gas pattern. Mild stool burden. No acute osseous abnormality.  IMPRESSION: Bilateral renal calculi.  Normal bowel gas pattern.   Electronically Signed   By: Conchita Paris M.D.   On: 10/04/2014 12:24    Medications: I have reviewed the patient's current medications.  Assessment/Plan:  74 year old woman with the following issues:  1. Possible plasma cell disorder: She presented with acute renal failure, anemia and an M spike of 0.3 g/dL. Her serum light chains showed an increase lambda free light chain of 9830 with a Kappa to  lambda ratio cannot be calculated. 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  These findings discussed with the patient and her family extensively this morning. I explained that the only way to make a definitive diagnosis would be a bone marrow biopsy. Risks and benefits of this procedure were discussed in detail. Complications include bleeding, infection, pain among others were discussed. I feel the risk is minimal at this time and it would be critical to making the diagnosis.  The differential diagnosis would include amyloidosis but I think a bone marrow biopsy should be able to differentiate between the two without a kidney biopsy.  I also discussed today in detail the implication of this diagnosis if it's indeed we are dealing with. She knows that she will require possible  chemotherapy and frequent oncology visits.  She understands that this is an incurable malignancy but without treatment her kidney function might deteriorate further and she might require further complications such as infection, worsening anemia and pathological fractures.  I will ask interventional radiology to evaluate her for a possible biopsy in the near future while she is discussing it with her family.  2. Renal failure: Could be related to plasma cell disorder as discussed  above. Appreciate pathology and pertinent regarding this issue.  3. Anemia: Could be secondary to renal failure and plasma cell disorder. Hemoglobin stable.  4. Follow-up/disposition: I have recommended that she undergo the bone marrow biopsy before her discharge, and follow up these results as an outpatient basis.   She is closer to [facility] which will provide a more convenient follow-up her upon discharge. I can arrange a follow-up for her there upon her discharge.   LOS: 7 days   [name] [name] 10/06/2014, 7:57 AM

## 2014-10-06 NOTE — Progress Notes (Signed)
PT Cancellation Note  Patient Details Name: [name] [name] MRN: [id number] DOB: November 23, 1940   Cancelled Treatment:    Reason Eval/Treat Not Completed: Other (comment)   Politely declining PT in order to finish up her bath;  Noted she is walking the hallways 2-3 times a day with family;   Will follow up later today as time allows;  Otherwise, will follow up for PT tomorrow;  Likely one more session then sign off;   Thank you,  Roney Marion, [place] Pager [phone] Office [phone]     Roney Marion [facility] 10/06/2014, 9:26 AM

## 2014-10-06 NOTE — Progress Notes (Signed)
Patient ID: [name] [name], female   DOB: January 21, 1941, 74 y.o.   MRN: [id number]  [facility] Progress Note    Assessment/ Plan:   1. AKI: Remains nonoliguric with sluggishly improving renal function. It is possible that her underlying plasma cell dyscrasia is compounding her current renal status and treatment of this will be the definitive measure of managing her renal disease. 2. M spike/suspicion for plasma cell dyscrasia: Plans noted for bone marrow biopsy possibly tomorrow and outpatient hematology follow-up thereafter 3. Hypertension: Blood pressures appear to be well controlled at this time on metoprolol monotherapy 4. Anemia: For ESA therapy until after bone marrow biopsy, borderline iron stores-will give intravenous iron  Subjective:   Reports to still having some nausea overnight. Interventional radiology extender at bedside explaining bone marrow biopsy/consent    Objective:   BP 123/55 mmHg  Pulse 81  Temp(Src) 99 F (37.2 C) (Oral)  Resp 17  Ht $R'5\' 2"'CZ$  (1.575 m)  Wt 77.88 kg (171 lb 11.1 oz)  BMI 31.40 kg/m2  SpO2 96%  Intake/Output Summary (Last 24 hours) at 10/06/14 0859 Last data filed at 10/06/14 0845  Gross per 24 hour  Intake    960 ml  Output   1050 ml  Net    -90 ml   Weight change: 1.673 kg (3 lb 11 oz)  Physical Exam: Gen: Comfortably resting on bedside commode CVS: Pulse regular in rate and rhythm Resp: Clear to auscultation, no rales Abd: Soft, obese, nontender Ext: No lower extremity edema appreciated  Imaging: Dg Abd 1 View  10/04/2014   CLINICAL DATA:  Abdominal distention  EXAM: ABDOMEN - 1 VIEW  COMPARISON:  None.  FINDINGS: Bilateral re- opacities over the kidneys are most compatible with renal calculi. Normal bowel gas pattern. Mild stool burden. No acute osseous abnormality.  IMPRESSION: Bilateral renal calculi.  Normal bowel gas pattern.   Electronically Signed   By: Conchita Paris M.D.   On: 10/04/2014 12:24     Labs: BMET  Recent Labs Lab 09/30/14 0447 10/01/14 1300 10/01/14 2013 10/02/14 0747 10/03/14 0623 10/04/14 0002 10/04/14 0525 10/05/14 0724  NA 142 144  --  142 143 142 143 144  K 4.6 4.3 4.6 4.6 4.9 4.6 4.8 4.6  CL 110 110  --  109 111 110 112* 112*  CO2 22 23  --  23 23 19* 22 22  GLUCOSE 117* 142*  --  121* 125* 129* 110* 138*  BUN 50* 46*  --  45* 46* 49* 50* 52*  CREATININE 3.69* 3.64*  --  3.74* 3.81* 4.00* 3.87* 3.78*  CALCIUM 8.4* 8.5*  --  8.6* 8.5* 8.2* 8.2* 8.3*  PHOS  --   --   --   --  4.9*  --   --   --    CBC  Recent Labs Lab 10/03/14 0623 10/04/14 0002 10/04/14 0525 10/05/14 0724  WBC 3.1* 3.9* 3.4* 3.7*  HGB 9.8* 9.9* 9.1* 9.4*  HCT 30.0* 29.8* 27.8* 28.2*  MCV 99.0 98.3 98.9 99.6  PLT 130* 123* 124* 119*    Medications:    . atorvastatin  80 mg Oral QHS  . docusate sodium  100 mg Oral BID  . feeding supplement (RESOURCE BREEZE)  1 Container Oral TID BM  . latanoprost  1 drop Both Eyes QHS  . metoprolol tartrate  50 mg Oral BID  . pantoprazole  40 mg Oral BID  . polyethylene glycol  17 g Oral BID  .  regadenoson  0.4 mg Intravenous Once  . sodium chloride  3 mL Intravenous Q12H    Elmarie Shiley, MD 10/06/2014, 8:59 AM

## 2014-10-06 NOTE — Consult Note (Signed)
Chief Complaint: Chief Complaint  Patient presents with  . Shortness of Breath  acute renal insufficiency Plasma cell disorder  Referring Physician(s): Dr Alen Blew  History of Present Illness: [name] [name] is a 74 y.o. female   Plasma cell disorder Anemia ARF Consulted/seen by Oncology Possible Multiple Myeloma Request for bone marrow biopsy I have seen and examined pt Will tentatively schedule procedure for 5/31---possible may be 6/1 secondary to IR and [facility] cytology schedule Pt aware and agreeable  Past Medical History  Diagnosis Date  . HTN (hypertension)   . Hyperlipidemia   . Hypertrophic cardiomyopathy     Mild hypertrophic CM without outflow tract obstruction -- ETT/Myoview EF 74%. no ischemia (12/2006)  --stress echo 8/10: normal BP response to exercise. no presyncope  no wall motion abnormalties. No significant LVOT obstruction. non-diagnositc ST depression  --holter monitor normal   . CAD (coronary artery disease)     --s/p DES LAD and POBA diagonal 11/10  --RHC cath normal  . COPD (chronic obstructive pulmonary disease)   . Obesity   . Pre-syncope   . CHF (congestive heart failure)     History reviewed. No pertinent past surgical history.  Allergies: Morphine and Penicillins  Medications: Prior to Admission medications   Medication Sig Start Date End Date Taking? Authorizing Provider  amLODipine (NORVASC) 5 MG tablet Take 5 mg by mouth daily.   Yes Historical Provider, MD  aspirin 81 MG EC tablet Take 81 mg by mouth daily.     Yes Historical Provider, MD  atorvastatin (LIPITOR) 80 MG tablet Take 80 mg by mouth at bedtime.    Yes Historical Provider, MD  Calcium Carbonate-Vitamin D (CALCIUM 600 + D PO) Take 600 mg by mouth daily.   Yes Historical Provider, MD  denosumab (PROLIA) 60 MG/ML SOLN injection Inject 60 mg into the skin every 6 (six) months. Administer in upper arm, thigh, or abdomen   Yes Historical Provider, MD  furosemide (LASIX) 40 MG  tablet Take 40 mg by mouth daily.   Yes Historical Provider, MD  latanoprost (XALATAN) 0.005 % ophthalmic solution Place 1 drop into both eyes at bedtime.   Yes Historical Provider, MD  losartan (COZAAR) 100 MG tablet Take 100 mg by mouth daily.     Yes Historical Provider, MD  metoprolol tartrate (LOPRESSOR) 25 MG tablet TAKE 1 TABLET BY MOUTH TWO TIMES A DAY 03/06/14  Yes Sherren Mocha, MD     Family History  Problem Relation Age of Onset  . CAD Mother   . Multiple myeloma Father   . Diabetes type II Sister   . CAD Brother   . Stroke Sister   . Uterine cancer Sister     History   Social History  . Marital Status: Widowed    Spouse Name: N/A  . Number of Children: N/A  . Years of Education: N/A   Social History Main Topics  . Smoking status: Former Smoker    Types: Cigarettes    Quit date: 05/09/2000  . Smokeless tobacco: Not on file  . Alcohol Use: No  . Drug Use: No  . Sexual Activity: Not on file   Other Topics Concern  . None   Social History Narrative    Review of Systems: A 12 point ROS discussed and pertinent positives are indicated in the HPI above.  All other systems are negative.  Review of Systems  Constitutional: Positive for activity change, appetite change and fatigue. Negative for fever.  Respiratory: Positive  for shortness of breath. Negative for cough.   Cardiovascular: Negative for chest pain.  Gastrointestinal: Negative for abdominal pain.  Neurological: Positive for weakness.  Psychiatric/Behavioral: Negative for behavioral problems and confusion.    Vital Signs: BP 123/55 mmHg  Pulse 81  Temp(Src) 99 F (37.2 C) (Oral)  Resp 17  Ht _0  (1.575 m)  Wt 77.88 kg (171 lb 11.1 oz)  BMI 31.40 kg/m2  SpO2 96%  Physical Exam  Constitutional: She is oriented to person, place, and time.  Cardiovascular: Normal rate and regular rhythm.   Pulmonary/Chest: Effort normal.  Abdominal: Soft. Bowel sounds are normal.  Musculoskeletal: Normal  range of motion.  Neurological: She is alert and oriented to person, place, and time.  Skin: Skin is warm and dry.  Psychiatric: She has a normal mood and affect. Her behavior is normal. Judgment and thought content normal.  Nursing note and vitals reviewed.   Mallampati Score:  MD Evaluation Airway: WNL Heart: WNL Abdomen: WNL Chest/ Lungs: WNL ASA  Classification: 3 Mallampati/Airway Score: One  Imaging: Dg Abd 1 View  10/04/2014   CLINICAL DATA:  Abdominal distention  EXAM: ABDOMEN - 1 VIEW  COMPARISON:  None.  FINDINGS: Bilateral re- opacities over the kidneys are most compatible with renal calculi. Normal bowel gas pattern. Mild stool burden. No acute osseous abnormality.  IMPRESSION: Bilateral renal calculi.  Normal bowel gas pattern.   Electronically Signed   By: Conchita Paris M.D.   On: 10/04/2014 12:24   Ct Chest Wo Contrast  09/29/2014   CLINICAL DATA:  Shortness of breath and nonproductive cough  EXAM: CT CHEST WITHOUT CONTRAST  TECHNIQUE: Multidetector CT imaging of the chest was performed following the standard protocol without IV contrast.  COMPARISON:  Chest radiograph 09/28/2014, thoracic MRI 05/28/2014  FINDINGS: Mediastinum/Nodes: Moderate atheromatous aortic and coronary arterial calcification without aneurysm. Moderate cardiac enlargement is noted. No pericardial effusion. No lymphadenopathy. Probable vascular at ectasia at the level of the right brachiocephalic artery/subclavian takeoff image 4.  Lungs/Pleura: Areas of patchy ground-glass airspace opacity are identified with interlobular septal thickening and central bronchial wall thickening. Central airways are patent. Trace pleural fluid.  Upper abdomen: The upper abdominal viscera unremarkable where visualized.  Musculoskeletal: T10 vertebra plana reidentified. No new acute osseous abnormality. The bones are subjectively osteopenic.  IMPRESSION: Patchy ground-glass airspace opacity with interlobular septal thickening  and bronchial wall thickening most compatible with early pulmonary edema.  Moderate cardiomegaly.   Electronically Signed   By: Conchita Paris M.D.   On: 09/29/2014 10:42   US Renal  09/29/2014   CLINICAL DATA:  Acute renal failure.  EXAM: RENAL / URINARY TRACT ULTRASOUND COMPLETE  COMPARISON:  None.  FINDINGS: Right Kidney:  Length: 11.8 cm. Echogenicity within normal limits. No hydronephrosis visualized. 8 mm cyst right upper renal pole. Right nephrolithiasis noted.  Left Kidney:  Length: 12.1 cm. Echogenicity within normal limits. No hydronephrosis visualized. 8 mm cyst left lower renal pole.  Bladder:  Appears normal for degree of bladder distention.  IMPRESSION: Bilateral simple renal cysts. Right nephrolithiasis. Exam is otherwise normal. No evidence of hydronephrosis or bladder distention.   Electronically Signed   By: Marcello Moores  Register   On: 09/29/2014 10:14   Nm Myocar Multi W/spect W/wall Motion / Ef  09/30/2014    The study is normal.  This is a low risk study.  The left ventricular ejection fraction is hyperdynamic (>65%).  There was no ST segment deviation noted during stress.   Normal  Lexiscan myovue with no ischemia or infarction EF 69%   Dg Chest Port 1 View  09/28/2014   CLINICAL DATA:  Shortness of breath and chest pressure with dry cough for the past 3 weeks. Recent diagnosis of congestive heart failure.  EXAM: PORTABLE CHEST - 1 VIEW  COMPARISON:  09/23/2014; 09/22/2012  FINDINGS: Grossly unchanged enlarged cardiac silhouette and mediastinal contours with partially calcified right suprahilar lymph nodes. Grossly unchanged bibasilar heterogeneous opacities, left greater than right, likely atelectasis or scar. No new focal airspace opacities. No pleural effusion or pneumothorax. No evidence of edema. No acute osseus abnormalities.  IMPRESSION: 1.  No acute cardiopulmonary disease. 2. Grossly unchanged bibasilar atelectasis/scar, left greater than right.   Electronically Signed   By:  Sandi Mariscal M.D.   On: 09/28/2014 22:15   Dg Bone Survey Met  10/02/2014   CLINICAL DATA:  Renal failure. Clinical concern for multiple myeloma.  EXAM: METASTATIC BONE SURVEY  COMPARISON:  None.  FINDINGS: Skull:  No lytic lesions.  Cervical spine: Degenerative changes, reversal of the normal lordosis, mild anterolisthesis at the C3-4 level and mild retrolisthesis at the C4-5 level and mild anterolisthesis at the C6-7 level. No lytic lesions.  Thoracic spine: Approximately 60% T10 vertebral compression deformity with anterior and posterior sclerosis and visible fracture line anteriorly. Approximately 30% T11 vertebral compression deformity with no definite acute fracture lines seen. No lytic lesions.  Lumbar spine mild degenerative changes with no lytic lesions.  Bilateral shoulders:  No lytic lesions.  Bilateral upper arms:  No lytic lesions.  Bilateral forearms:  No lytic lesions.  Chest: Enlarged cardiac silhouette. Diffusely prominent interstitial markings with mild diffuse peribronchial thickening. No lytic lesions.  Pelvis: No lytic lesions.  Femurs: No lytic lesions.  Lower legs: No lytic lesions. Right knee degenerative changes and old lateral tibial plateau compression deformity.  Diffuse osteopenia is noted involving all of the bones.  IMPRESSION: 1. Diffuse osteopenia with no lytic lesions seen. 2. Approximately 60% T10 vertebral compression deformity with chronic and possible acute components. 3. Approximately 40% T11 vertebral compression deformity. 4. Cardiomegaly. 5. Chronic bronchitic changes with chronic interstitial lung disease. 6. Right knee degenerative changes and old lateral tibial plateau compression deformity.   Electronically Signed   By: Claudie Revering M.D.   On: 10/02/2014 15:50    Labs:  CBC:  Recent Labs  10/04/14 0002 10/04/14 0525 10/05/14 0724 10/06/14 0944  WBC 3.9* 3.4* 3.7* 4.5  HGB 9.9* 9.1* 9.4* 9.6*  HCT 29.8* 27.8* 28.2* 30.6*  PLT 123* 124* 119* 119*     COAGS: No results for input(s): INR, APTT in the last 8760 hours.  BMP:  Recent Labs  10/03/14 0623 10/04/14 0002 10/04/14 0525 10/05/14 0724  NA 143 142 143 144  K 4.9 4.6 4.8 4.6  CL 111 110 112* 112*  CO2 23 19* 22 22  GLUCOSE 125* 129* 110* 138*  BUN 46* 49* 50* 52*  CALCIUM 8.5* 8.2* 8.2* 8.3*  CREATININE 3.81* 4.00* 3.87* 3.78*  GFRNONAA 11* 10* 10* 11*  GFRAA 12* 12* 12* 12*    LIVER FUNCTION TESTS:  Recent Labs  09/29/14 0340 10/03/14 0623 10/04/14 0002 10/05/14 1220  BILITOT 0.8 1.1 1.0 0.9  AST 20 65* 45* 39  ALT 23 85* 74* 68*  ALKPHOS 76 103 101 115  PROT 6.0* 6.0* 5.8* 6.4*  ALBUMIN 3.3* 3.1* 3.0* 3.2*    TUMOR MARKERS: No results for input(s): AFPTM, CEA, CA199, CHROMGRNA in the last 8760 hours.  Assessment and Plan:  Plasma cell disorder Anemia Possible MM Scheduled for BM bx in IR poss 5/31 May be 6/1 if [facility] cyto schedule cannot accomate 5/31 Risks and Benefits discussed with the patient including, but not limited to bleeding, infection, damage to adjacent structures or low yield requiring additional tests. All of the patient's questions were answered, patient is agreeable to proceed. Consent signed and in chart.   Thank you for this interesting consult.  I greatly enjoyed meeting CATHERYNE DEFORD and look forward to participating in their care.  Signed: Kathelyn Gombos A 10/06/2014, 11:46 AM   I spent a total of 40 Minutes    in face to face in clinical consultation, greater than 50% of which was counseling/coordinating care for BM bx

## 2014-10-07 ENCOUNTER — Ambulatory Visit: Payer: Medicare Other | Admitting: Physician Assistant

## 2014-10-07 DIAGNOSIS — R079 Chest pain, unspecified: Secondary | ICD-10-CM | POA: Insufficient documentation

## 2014-10-07 LAB — RENAL FUNCTION PANEL
Albumin: 2.9 g/dL — ABNORMAL LOW (ref 3.5–5.0)
Anion gap: 13 (ref 5–15)
BUN: 54 mg/dL — AB (ref 6–20)
CALCIUM: 8.7 mg/dL — AB (ref 8.9–10.3)
CHLORIDE: 113 mmol/L — AB (ref 101–111)
CO2: 20 mmol/L — AB (ref 22–32)
Creatinine, Ser: 3.63 mg/dL — ABNORMAL HIGH (ref 0.44–1.00)
GFR calc Af Amer: 13 mL/min — ABNORMAL LOW (ref >60)
GFR, EST NON AFRICAN AMERICAN: 11 mL/min — AB (ref >60)
GLUCOSE: 117 mg/dL — AB (ref 65–99)
POTASSIUM: 5.1 mmol/L (ref 3.5–5.1)
Phosphorus: 4.3 mg/dL (ref 2.5–4.6)
SODIUM: 146 mmol/L — AB (ref 135–145)

## 2014-10-07 LAB — ERYTHROPOIETIN: Erythropoietin: 51.9 m[IU]/mL — ABNORMAL HIGH (ref 2.6–18.5)

## 2014-10-07 NOTE — Progress Notes (Signed)
Physical Therapy Treatment and Discharge Patient Details Name: [name] [name] MRN: [id number] DOB: [birth year] Today's Date: 10/07/2014    History of Present Illness 74 yo female presented to ED with SOB, chest tightness, and cough; has a past medical history of HTN (hypertension); Hyperlipidemia; Hypertrophic cardiomyopathy; CAD (coronary artery disease); COPD (chronic obstructive pulmonary disease); Obesity; Pre-syncope; and CHF (congestive heart failure).; recent admission to [facility]    PT Comments    Pt feeling lousy this afternoon, but still willing to get up and walk; Ultimately she didn't look up to hallway amb, so we simply walked in room; she is regularly getting up with family and walking hallways; We discussed self-monitoring for activity tolerance; Pt hopes to dc tomorrow after biopsy;   No further acute PT needs noted; Will sign off;   If pt does not dc tomorrow, and functional deficits are noted, feel free to reconsult and we will be happy to return;    Follow Up Recommendations  No PT follow up;Supervision - Intermittent     Equipment Recommendations  None recommended by PT    Recommendations for Other Services       Precautions / Restrictions Precautions Precautions: None    Mobility  Bed Mobility                  Transfers Overall transfer level: Modified independent               General transfer comment: Able to move sit <> stand with increased time - no physical assist needed.  Patient coming out of bathroom when I arrived  Ambulation/Gait Ambulation/Gait assistance: Modified independent (Device/Increase time) Ambulation Distance (Feet): 15 Feet Assistive device: None Gait Pattern/deviations: Step-through pattern Gait velocity: Decreased   General Gait Details: Slow and guarded; pt not feeling well this afternoon; says she might walk in hallways with family later today   Stairs         General stair comments: Pt will be going  to family's home where they have 2 steps to enter  Wheelchair Mobility    Modified Rankin (Stroke Patients Only)       Balance                                    Cognition Arousal/Alertness: Awake/alert Behavior During Therapy: WFL for tasks assessed/performed Overall Cognitive Status: Within Functional Limits for tasks assessed                      Exercises      General Comments        Pertinent Vitals/Pain Pain Assessment: No/denies pain    Home Living                      Prior Function            PT Goals (current goals can now be found in the care plan section) Acute Rehab PT Goals Patient Stated Goal: hopes to feel better Progress towards PT goals: Goals met/education completed, patient discharged from PT    Frequency  Min 3X/week    PT Plan Current plan remains appropriate    Co-evaluation             End of Session   Activity Tolerance: Patient tolerated treatment well Patient left: in bed;with family/visitor present (sitting EOB)     Time: 9758-8325 PT Time Calculation (min) (ACUTE  ONLY): 12 min  Charges:  $Gait Training: 8-22 mins                    G Codes:      Quin Hoop 10/07/2014, 3:57 PM  Roney [place], [place] Pager [phone] Office [phone]

## 2014-10-07 NOTE — Care Management Note (Signed)
Case Management Note  Patient Details  Name: [name] [name] MRN: [id number] Date of Birth: [birth year]  Subjective/Objective:                 CM following for progression and d/c planning   Action/Plan: 10/03/2014 Met with pt and daughter, plan is to return to home. 10/07/2014 Plan for bx tomorrow 10/08/14 then d/c , pt and family in agreement, no d//c needs identified.   Expected Discharge Date:                  Expected Discharge Plan:  Home/Self Care  In-House Referral:  NA  Discharge planning Services  CM Consult  Post Acute Care Choice:  NA Choice offered to:  NA  DME Arranged:    DME Agency:     HH Arranged:    HH Agency:     Status of Service:  In process, will continue to follow  Medicare Important Message Given:  Yes Date Medicare IM Given:  10/03/14 Medicare IM give by:  Jasmine Pang RN MPH, case manager Date Additional Medicare IM Given:  10/07/14 Additional Medicare Important Message give by:  Jasmine Pang RN MPH, case manger  If discussed at Long Length of Stay Meetings, dates discussed:    Additional Comments:  Adron Bene, RN 10/07/2014, 11:48 AM

## 2014-10-07 NOTE — Progress Notes (Signed)
Patient ID: [name] [name], female   DOB: [birth year], 74 y.o.   MRN: [id number] [facility] PROGRESS NOTE  [name] [name] MBE:[id number] DOB: 10-13-1940 DOA: 09/28/2014 PCP: [name] Burly, MD  Brief narrative:    74 year old female with past medical history of chronic diastolic CHF, hypertrophy cardiomyopathy, extensive cardiac work up in past including right and left heart catheterization in 2010 (with 90% LAD lesion which was stented), COPD, hypertension, recent hospitalization in [facility] hospital for acute diastolic CHF where she underwent pretty aggressive diureses. Patient now presented to [facility] ED with intermittent chest tightness, cough ever since discharge from [facility].   On this admission, she was found to have acute renal failure with creatinine level in 4 range (her baseline is normal creatinine). Initial troponin was WNL. Cardio has seen in her consultation and plans for stress test. Of note, CXR on admission showed possible early pulmonary edema. Stress test was low risk. Chest pain was thought to be secondary to anemia in setting of cardiomyopathy.   Patient presents with anemia and renal failure. UPEP, SPEP consistent with possible plasma cell disorder. Plan to proceed to Bone Marrow biopsy 6-1. patient might be able to be discharge after bone marrow if renal function and Hb stable.     Assessment/Plan:    Acute renal failure - Might be related to plasma cell disorder.  - Value and on this admission 4.1 - cr fluctuates 3.6 to 3.8 -SPEP, UPEP: Lambda light chain 9830. Mild M spike -Renal US; No evidence of hydronephrosis or bladder distention. Right nephrolithiasis.  -UA 30 gr protein.  -Appreciate nephrology evaluation. hemodynamic vs dysproteinemia.   Anemia, pancytopenia: Possible plasma cell Disorder.  Patient denies melena, hematochezia. She had colonoscopy 2014.  Last hb 5 years ago was at 35. Anemia panel: normal B-12, iron ferritin.  peripheral  smear negative for schitocytes. UPEP, SPEP: Lambda light chain 9830.   Mild M spike.  Hematology  Oncology consulted, and appreciate help.  occult blood negative S/P  2 PRBC.  Bone survey only showed osteopenia. Prior compression fracture. Patient was aware of compression fracture.  occult blood negative.  Transfuse for hb less than 8 erythropoietin level pending.  Bone Marrow biopsy 5-31.  Nausea, improved KUB negative.  Avoid phenergan. Due to adverse effect Continue with Zofran,.  LFT trending down.   Encephalopathy:  Became confuse, lethargic after phenergan. Avoid Phenergan.  Back to baseline.   Acute on chronic diastolic heart failure - Pt with history of diastolic CHF - 2 D ECHO on this admission showed normal EF but septal hypertrophy - BNP 597 on this admission - Weight since admission, 77 kg --> 78.9 kg - Will follow up with cardio about CHF management. Pt currently not on lasix due to acute renal failure. -Continue daily weight and strict intake and output  -Holding lasix due to renal failure.    Chest pain - In pt with extensive cardiac history and work up, concern is always for ACS - Her troponin levels remain WNL - Continue aspirin, Lipitor and metoprolol  - Cardiology sign off,  appreciate their recommendations  -NST; Normal study with normal perfusion. Low risk test. EF 65%.  Essential hypertension - Continue metoprolol 25 mg PO BID  Dyslipidemia - Continue statin therapy   DVT Prophylaxis  -scds.   Code Status: Full.  Family Communication:  plan of care discussed with the patient  Disposition Plan: discharge after bone marrow biopsy.   IV access:  Peripheral IV  Procedures  and diagnostic studies:    Ct Chest Wo Contrast 10/25/2014 Patchy ground-glass airspace opacity with interlobular septal thickening and bronchial wall thickening most compatible with early pulmonary edema.  Moderate cardiomegaly.   Electronically Signed   By: [name] [name] M.D.    On: 25-Oct-2014 10:42   US Renal 10/25/2014  Bilateral simple renal cysts. Right nephrolithiasis. Exam is otherwise normal. No evidence of hydronephrosis or bladder distention.   Electronically Signed   By: [name] [name]   On: Oct 25, 2014 10:14   Nm Myocar Multi W/spect [name] [name] Motion / Ef 09/30/2014  Dg Chest Port 1 View 09/28/2014  1.  No acute cardiopulmonary disease. 2. Grossly unchanged bibasilar atelectasis/scar, left greater than right.     Medical Consultants:  Cardiology  Oncology.   Other Consultants:  None   IAnti-Infectives:      [name] Shiley, MD  [facility] Pager [phone]  Time spent in minutes: 25 minutes  If 7PM-7AM, please contact night-coverage [email or web address] Password [email or web address] 10/07/2014, 1:57 PM   LOS: 8 days    HPI/Subjective: Feeling ok, nausea has improved, no abdominal pain  Objective: Filed Vitals:   10/06/14 1712 10/06/14 2119 10/07/14 0434 10/07/14 0812  BP: 98/45 114/38 122/44 127/45  Pulse: 72 77 71 73  Temp: 98.6 F (37 C) 98.6 F (37 C) 98.4 F (36.9 C) 98.8 F (37.1 C)  TempSrc: Oral Oral Oral Oral  Resp: $Remo'16 18 18 17  'hgrTL$ Height:      Weight:  80.105 kg (176 lb 9.6 oz)    SpO2: 96% 95% 98% 99%    Intake/Output Summary (Last 24 hours) at 10/07/14 1357 Last data filed at 10/07/14 1159  Gross per 24 hour  Intake   1310 ml  Output   1000 ml  Net    310 ml    Exam:   General:  Pt is alert, follows commands appropriately, not in acute distress  Cardiovascular: Regular rate and rhythm, S1/S2 (+)  Respiratory: diminished breath sounds bilaterally, no wheezing, no crackles, no rhonchi  Abdomen: Soft, non tender, non distended, bowel sounds present  Extremities: No edema, pulses DP and PT palpable bilaterally  Data Reviewed: Basic Metabolic Panel:  Recent Labs Lab 10/01/14 2013  10/03/14 3662 10/04/14 0002 10/04/14 0525 10/05/14 0724 10/06/14 0944 10/07/14 1250  NA  --   < > 143 142 143 144 146* 146*  K 4.6   < > 4.9 4.6 4.8 4.6 4.8 5.1  CL  --   < > 111 110 112* 112* 113* 113*  CO2  --   < > 23 19* $Remo'22 22 22 'ggvMa$ 20*  GLUCOSE  --   < > 125* 129* 110* 138* 162* 117*  BUN  --   < > 46* 49* 50* 52* 51* 54*  CREATININE  --   < > 3.81* 4.00* 3.87* 3.78* 3.45* 3.63*  CALCIUM  --   < > 8.5* 8.2* 8.2* 8.3* 8.5* 8.7*  MG 2.2  --   --   --   --   --   --   --   PHOS  --   --  4.9*  --   --   --   --  4.3  < > = values in this interval not displayed. Liver Function Tests:  Recent Labs Lab 10/03/14 0623 10/04/14 0002 10/05/14 1220 10/07/14 1250  AST 65* 45* 39  --   ALT 85* 74* 68*  --   ALKPHOS 103 101 115  --  BILITOT 1.1 1.0 0.9  --   PROT 6.0* 5.8* 6.4*  --   ALBUMIN 3.1* 3.0* 3.2* 2.9*   No results for input(s): LIPASE, AMYLASE in the last 168 hours. No results for input(s): AMMONIA in the last 168 hours. CBC:  Recent Labs Lab 10/03/14 0623 10/04/14 0002 10/04/14 0525 10/05/14 0724 10/06/14 0944  WBC 3.1* 3.9* 3.4* 3.7* 4.5  HGB 9.8* 9.9* 9.1* 9.4* 9.6*  HCT 30.0* 29.8* 27.8* 28.2* 30.6*  MCV 99.0 98.3 98.9 99.6 101.0*  PLT 130* 123* 124* 119* 119*   Cardiac Enzymes: No results for input(s): CKTOTAL, CKMB, CKMBINDEX, TROPONINI in the last 168 hours. BNP: Invalid input(s): POCBNP CBG: No results for input(s): GLUCAP in the last 168 hours.  No results found for this or any previous visit (from the past 240 hour(s)).   Scheduled Meds: . aspirin EC  81 mg Oral Daily  . atorvastatin  80 mg Oral QHS  . doxycycline  100 mg Oral Q12H  . heparin  5,000 Units Subcutaneous 3 times per day  . metoprolol tartrate  25 mg Oral BID  . pantoprazole  40 mg Oral Q1200

## 2014-10-07 NOTE — Progress Notes (Signed)
10/07/2014 7:37 AM  Spoke with IR.  Patient is scheduled for BM biopsy tomorrow morning (6/1) at 0900 here at [facility].  Princella Pellegrini

## 2014-10-07 NOTE — Progress Notes (Signed)
Patient ID: [name] [name], female   DOB: Jul 15, 1940, 74 y.o.   MRN: [id number]  [facility] Progress Note    Assessment/ Plan:   1. AKI: Remains nonoliguric with labs pending this morning to assess renal function. It is possible that her underlying plasma cell dyscrasia is compounding her current renal status and treatment of this will be the definitive measure of managing her renal disease. 2. M spike/suspicion for plasma cell dyscrasia: Initially scheduled for bone marrow biopsy this morning by interventional radiology however, the patient informs me that this has been now deferred until tomorrow 3. Hypertension: Blood pressures appear to be well controlled at this time on metoprolol monotherapy 4. Anemia: For ESA therapy until after bone marrow biopsy, borderline iron stores-will give intravenous iron  Subjective:   Reports to have had a restless night anxious about her possible bone marrow biopsy today    Objective:   BP 127/45 mmHg  Pulse 73  Temp(Src) 98.8 F (37.1 C) (Oral)  Resp 17  Ht $R'5\' 2"'mQ$  (1.575 m)  Wt 80.105 kg (176 lb 9.6 oz)  BMI 32.29 kg/m2  SpO2 99%  Intake/Output Summary (Last 24 hours) at 10/07/14 0853 Last data filed at 10/07/14 0846  Gross per 24 hour  Intake   1532 ml  Output   1300 ml  Net    232 ml   Weight change: 2.223 kg (4 lb 14.4 oz)  Physical Exam: Gen: Appears to be uncomfortable resting in recliner-son's fianc at bedside CVS: Pulse regular in rate and rhythm, S1 and S2 normal Resp: Decreased breath sounds over bases Abd: Soft, obese, nontender Ext: Trace ankle edema  Imaging: No results found.  Labs: BMET  Recent Labs Lab 10/01/14 1300 10/01/14 2013 10/02/14 0747 10/03/14 1448 10/04/14 0002 10/04/14 0525 10/05/14 0724 10/06/14 0944  NA 144  --  142 143 142 143 144 146*  K 4.3 4.6 4.6 4.9 4.6 4.8 4.6 4.8  CL 110  --  109 111 110 112* 112* 113*  CO2 23  --  23 23 19* $Remo'22 22 22  'qRDyz$ GLUCOSE 142*  --  121* 125* 129*  110* 138* 162*  BUN 46*  --  45* 46* 49* 50* 52* 51*  CREATININE 3.64*  --  3.74* 3.81* 4.00* 3.87* 3.78* 3.45*  CALCIUM 8.5*  --  8.6* 8.5* 8.2* 8.2* 8.3* 8.5*  PHOS  --   --   --  4.9*  --   --   --   --    CBC  Recent Labs Lab 10/04/14 0002 10/04/14 0525 10/05/14 0724 10/06/14 0944  WBC 3.9* 3.4* 3.7* 4.5  HGB 9.9* 9.1* 9.4* 9.6*  HCT 29.8* 27.8* 28.2* 30.6*  MCV 98.3 98.9 99.6 101.0*  PLT 123* 124* 119* 119*    Medications:    . atorvastatin  80 mg Oral QHS  . docusate sodium  100 mg Oral BID  . feeding supplement (RESOURCE BREEZE)  1 Container Oral TID BM  . latanoprost  1 drop Both Eyes QHS  . metoprolol tartrate  50 mg Oral BID  . pantoprazole  40 mg Oral BID  . polyethylene glycol  17 g Oral BID  . regadenoson  0.4 mg Intravenous Once  . sodium chloride  3 mL Intravenous Q12H    Elmarie Shiley, MD 10/07/2014, 8:53 AM

## 2014-10-08 ENCOUNTER — Other Ambulatory Visit: Payer: Self-pay | Admitting: Oncology

## 2014-10-08 ENCOUNTER — Inpatient Hospital Stay (HOSPITAL_COMMUNITY): Payer: Medicare Other

## 2014-10-08 DIAGNOSIS — I5043 Acute on chronic combined systolic (congestive) and diastolic (congestive) heart failure: Secondary | ICD-10-CM

## 2014-10-08 DIAGNOSIS — N179 Acute kidney failure, unspecified: Secondary | ICD-10-CM

## 2014-10-08 DIAGNOSIS — N189 Chronic kidney disease, unspecified: Principal | ICD-10-CM

## 2014-10-08 DIAGNOSIS — D729 Disorder of white blood cells, unspecified: Secondary | ICD-10-CM

## 2014-10-08 DIAGNOSIS — D631 Anemia in chronic kidney disease: Secondary | ICD-10-CM

## 2014-10-08 LAB — CBC
HCT: 29 % — ABNORMAL LOW (ref 36.0–46.0)
Hemoglobin: 9.2 g/dL — ABNORMAL LOW (ref 12.0–15.0)
MCH: 31.9 pg (ref 26.0–34.0)
MCHC: 31.7 g/dL (ref 30.0–36.0)
MCV: 100.7 fL — ABNORMAL HIGH (ref 78.0–100.0)
Platelets: 115 K/uL — ABNORMAL LOW (ref 150–400)
RBC: 2.88 MIL/uL — ABNORMAL LOW (ref 3.87–5.11)
RDW: 15.5 % (ref 11.5–15.5)
WBC: 5.1 K/uL (ref 4.0–10.5)

## 2014-10-08 LAB — RENAL FUNCTION PANEL
Albumin: 3.1 g/dL — ABNORMAL LOW (ref 3.5–5.0)
Anion gap: 13 (ref 5–15)
BUN: 54 mg/dL — ABNORMAL HIGH (ref 6–20)
CO2: 21 mmol/L — ABNORMAL LOW (ref 22–32)
CREATININE: 3.63 mg/dL — AB (ref 0.44–1.00)
Calcium: 9 mg/dL (ref 8.9–10.3)
Chloride: 110 mmol/L (ref 101–111)
GFR, EST AFRICAN AMERICAN: 13 mL/min — AB (ref >60)
GFR, EST NON AFRICAN AMERICAN: 11 mL/min — AB (ref >60)
GLUCOSE: 121 mg/dL — AB (ref 65–99)
Phosphorus: 4.9 mg/dL — ABNORMAL HIGH (ref 2.5–4.6)
Potassium: 4.8 mmol/L (ref 3.5–5.1)
Sodium: 144 mmol/L (ref 135–145)

## 2014-10-08 LAB — BONE MARROW EXAM

## 2014-10-08 MED ORDER — MIDAZOLAM HCL 2 MG/2ML IJ SOLN
INTRAMUSCULAR | Status: AC
  Filled 2014-10-08: qty 2

## 2014-10-08 MED ORDER — FENTANYL CITRATE (PF) 100 MCG/2ML IJ SOLN
INTRAMUSCULAR | Status: AC
  Filled 2014-10-08: qty 2

## 2014-10-08 MED ORDER — LIDOCAINE HCL 1 % IJ SOLN
INTRAMUSCULAR | Status: AC
  Filled 2014-10-08: qty 20

## 2014-10-08 MED ORDER — FUROSEMIDE 10 MG/ML IJ SOLN: 40.0000 mg | Freq: Two times a day (BID) | INTRAMUSCULAR | Status: DC

## 2014-10-08 MED ORDER — FUROSEMIDE 10 MG/ML IJ SOLN
20.0000 mg | Freq: Two times a day (BID) | INTRAMUSCULAR | Status: AC
  Administered 2014-10-08 (×2): 20 mg via INTRAVENOUS
  Filled 2014-10-08 (×2): qty 2

## 2014-10-08 MED ORDER — FENTANYL CITRATE (PF) 100 MCG/2ML IJ SOLN
INTRAMUSCULAR | Status: AC | PRN
  Administered 2014-10-08: 25 ug via INTRAVENOUS

## 2014-10-08 MED ORDER — MIDAZOLAM HCL 2 MG/2ML IJ SOLN
INTRAMUSCULAR | Status: AC | PRN
  Administered 2014-10-08: 0.5 mg via INTRAVENOUS
  Administered 2014-10-08: 1 mg via INTRAVENOUS

## 2014-10-08 MED ORDER — SODIUM CHLORIDE 0.9 % IV SOLN
INTRAVENOUS | Status: AC | PRN
  Administered 2014-10-08: 10 mL/h via INTRAVENOUS

## 2014-10-08 NOTE — Progress Notes (Signed)
Events noted. Patient clinically is stable and plans for discharge today after bone marrow biopsy.   Hemoglobin remains stable around 9.6. White cell count and his back to normal with a platelet count of 119 the continues to be stable. Her creatinine is also stable around 3.5-3.6.  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 Bone marrow biopsy is scheduled for today but the results will not be available probably total next week.  She will need outpatient hematology oncology follow up and she prefers that to be done closer to home and feels that [facility] is a benefit for her.  I will arrange a follow-up for her with Dr. Whitney Muse at [facility] next week by that time the results of the bone marrow will be available.  Please call with questions regarding this nice woman.

## 2014-10-08 NOTE — Procedures (Signed)
BM Bx No comp

## 2014-10-08 NOTE — Sedation Documentation (Signed)
Patient denies pain and is resting comfortably.  

## 2014-10-08 NOTE — Progress Notes (Signed)
Patient ID: TOMIA ENLOW, female   DOB: [birth year], 74 y.o.   MRN: [id number]  [facility] Progress Note    Assessment/ Plan:   1. AKI: Remains nonoliguric with stable renal function however, features of volume overload based on chest x-ray. I discussed with the family the possible risk of worsening her renal function (cast precipitation) with use of loop diuretics but this is warranted at this time due to her volume status/breathing problems. Suspect that management of her underlying myeloma will help her overall renal prognosis. 2. M spike/suspicion for plasma cell dyscrasia: Status post bone marrow biopsy earlier today and will be followed up as an outpatient in [place] by oncology/hematology for further management. 3. Hypertension: Blood pressures appear to be well controlled at this time on metoprolol monotherapy-monitor with diuresis 4. Anemia: For ESA therapy until after bone marrow biopsy, borderline iron stores-status post intravenous iron  Subjective:   Reports to be having some shortness of breath-family at bedside concerned about prognosis    Objective:   BP 132/48 mmHg  Pulse 88  Temp(Src) 97.8 F (36.6 C) (Oral)  Resp 16  Ht $R'5\' 2"'ZK$  (1.575 m)  Wt 79.5 kg (175 lb 4.3 oz)  BMI 32.05 kg/m2  SpO2 100%  Intake/Output Summary (Last 24 hours) at 10/08/14 6720 Last data filed at 10/08/14 0600  Gross per 24 hour  Intake   1302 ml  Output   1200 ml  Net    102 ml   Weight change: -0.605 kg (-1 lb 5.4 oz)  Physical Exam: Gen: Appears to be uncomfortable resting in bed laying flat CVS: Pulse regular in rate and rhythm, S1 and S2 normal Resp: Fine rales over both bases, no wheezing Abd: Soft, flat, nontender Ext: Trace lower extremity edema, 1+ edema over back  Imaging: No results found.  Labs: BMET  Recent Labs Lab 10/03/14 9470 10/04/14 0002 10/04/14 0525 10/05/14 0724 10/06/14 0944 10/07/14 1250 10/08/14 0611  NA 143 142 143 144 146* 146*  144  K 4.9 4.6 4.8 4.6 4.8 5.1 4.8  CL 111 110 112* 112* 113* 113* 110  CO2 23 19* $Remo'22 22 22 'dewKQ$ 20* 21*  GLUCOSE 125* 129* 110* 138* 162* 117* 121*  BUN 46* 49* 50* 52* 51* 54* 54*  CREATININE 3.81* 4.00* 3.87* 3.78* 3.45* 3.63* 3.63*  CALCIUM 8.5* 8.2* 8.2* 8.3* 8.5* 8.7* 9.0  PHOS 4.9*  --   --   --   --  4.3 4.9*   CBC  Recent Labs Lab 10/04/14 0525 10/05/14 0724 10/06/14 0944 10/08/14 0802  WBC 3.4* 3.7* 4.5 5.1  HGB 9.1* 9.4* 9.6* 9.2*  HCT 27.8* 28.2* 30.6* 29.0*  MCV 98.9 99.6 101.0* 100.7*  PLT 124* 119* 119* 115*    Medications:    . atorvastatin  80 mg Oral QHS  . docusate sodium  100 mg Oral BID  . feeding supplement (RESOURCE BREEZE)  1 Container Oral TID BM  . fentaNYL      . latanoprost  1 drop Both Eyes QHS  . lidocaine      . metoprolol tartrate  50 mg Oral BID  . midazolam      . pantoprazole  40 mg Oral BID  . polyethylene glycol  17 g Oral BID  . regadenoson  0.4 mg Intravenous Once  . sodium chloride  3 mL Intravenous Q12H   Elmarie Shiley, MD 10/08/2014, 9:23 AM

## 2014-10-08 NOTE — Progress Notes (Signed)
Patient ID: [name] [name], female   DOB: [birth year], 74 y.o.   MRN: [id number] [facility] PROGRESS NOTE  TREASE BREMNER EOF:[id number] DOB: [birth year] DOA: 09/28/2014 PCP: Neale Burly, MD  Brief narrative:    74 year old female with past medical history of chronic diastolic CHF, hypertrophy cardiomyopathy, extensive cardiac work up in past including right and left heart catheterization in 2010 (with 90% LAD lesion which was stented), COPD, hypertension, recent hospitalization in [facility] hospital for acute diastolic CHF where she underwent pretty aggressive diureses. Patient now presented to [facility] ED with intermittent chest tightness, cough ever since discharge from [facility].   On this admission, she was found to have acute renal failure with creatinine level in 4 range (her baseline is normal creatinine). Initial troponin was WNL. Cardio has seen in her consultation and plans for stress test. Of note, CXR on admission showed possible early pulmonary edema. Stress test was low risk. Chest pain was thought to be secondary to anemia in setting of cardiomyopathy.   Patient presents with anemia and renal failure. UPEP, SPEP consistent with possible plasma cell disorder. Plan to proceed to Bone Marrow biopsy 6-1. patient might be able to be discharge after bone marrow if renal function and Hb stable.     Assessment/Plan:    Acute renal failure - Likely related to plasma cell disorder.  - creatinine this admission 4.1 - cr fluctuates 3.6 to 3.8 -SPEP, UPEP: Lambda light chain 9830. Mild M spike -Renal US; No evidence of hydronephrosis or bladder distention. Right nephrolithiasis.  -UA 30 gr protein.  -BM biopsy today.   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 Anemia panel: normal B-12, iron ferritin, peripheral smear negative for schitocytes.  -UPEP, SPEP: Lambda light chain 9830.   Mild M spike.  Hematology  Oncology consulted, and  appreciate help.  S/P  2 PRBC.  Bone survey only showed osteopenia. Prior compression fracture. Patient was aware of compression fracture.  occult blood negative.  Transfuse for hb less than 8 erythropoietin level pending.  Bone Marrow biopsy 6/1, FU with Onc in [facility] Dr. Whitney Muse, FU arranged by Dr,Shadad  Encephalopathy:  Became confused, lethargic after phenergan. Avoid Phenergan.  Back to baseline.   Acute on chronic diastolic heart failure - Pt with history of diastolic CHF - 2 D ECHO on this admission showed normal EF , BNP 597 on this admission - Weight since admission, 77 kg --> 79.5 kg - with Pulm edema on CXR today, IV lasix ordered per Renal  Chest pain -resolved - In pt with extensive cardiac history and work up, troponin negtaive - Continue aspirin, Lipitor and metoprolol  - Cardiology signed off,  appreciate their recommendations  -NST; Normal study with normal perfusion. Low risk test. EF 65%.  Essential hypertension - Continue metoprolol 25 mg PO BID  Dyslipidemia - Continue statin therapy   DVT Prophylaxis  -scds.   Code Status: Full.  Family Communication:  plan of care discussed with the patient and extended family Disposition Plan: hom when stable.   IV access:  Peripheral IV  Procedures and diagnostic studies:    Ct Chest Wo Contrast Oct 07, 2014 Patchy ground-glass airspace opacity with interlobular septal thickening and bronchial wall thickening most compatible with early pulmonary edema.  Moderate cardiomegaly.   Electronically Signed   By: Conchita Paris M.D.   On: 07-Oct-2014 10:42   US Renal Oct 07, 2014  Bilateral simple renal cysts. Right nephrolithiasis. Exam is otherwise  normal. No evidence of hydronephrosis or bladder distention.   Electronically Signed   By: [place]   On: 09/29/2014 10:14   Nm Myocar Multi W/spect Tamela Oddi Motion / Ef 09/30/2014  Dg Chest Port 1 View 09/28/2014  1.  No acute cardiopulmonary disease. 2. Grossly unchanged  bibasilar atelectasis/scar, left greater than right.     Medical Consultants:  Cardiology  Oncology.   Other Consultants:  None   IAnti-Infectives:      Domenic Polite, MD  [facility] Pager [phone]  Time spent in minutes: 25 minutes  If 7PM-7AM, please contact night-coverage [email or web address] Password [email or web address] 10/08/2014, 2:25 PM   LOS: 9 days    HPI/Subjective: Complains of dyspnea this am  Objective: Filed Vitals:   10/08/14 1236 10/08/14 1310 10/08/14 1347 10/08/14 1409  BP: 119/42 109/40 129/42 116/41  Pulse: 80 73 73 71  Temp: 98.3 F (36.8 C) 98.2 F (36.8 C) 98.2 F (36.8 C) 98.1 F (36.7 C)  TempSrc: Oral Oral Oral Oral  Resp: $Remo'24 18 24 20  'zjtOU$ Height:      Weight:      SpO2: 98% 99% 99% 97%    Intake/Output Summary (Last 24 hours) at 10/08/14 1425 Last data filed at 10/08/14 1351  Gross per 24 hour  Intake    702 ml  Output   1500 ml  Net   -798 ml    Exam:   General:  Pt is alert, follows commands appropriately, not in acute distress  Cardiovascular: Regular rate and rhythm, S1/S2 (+)  Respiratory: diminished breath sounds bilaterally, no wheezing, no crackles, no rhonchi  Abdomen: Soft, non tender, non distended, bowel sounds present  Extremities: No edema, pulses DP and PT palpable bilaterally  Data Reviewed: Basic Metabolic Panel:  Recent Labs Lab 10/01/14 2013  10/03/14 6314  10/04/14 0525 10/05/14 0724 10/06/14 0944 10/07/14 1250 10/08/14 0611  NA  --   < > 143  < > 143 144 146* 146* 144  K 4.6  < > 4.9  < > 4.8 4.6 4.8 5.1 4.8  CL  --   < > 111  < > 112* 112* 113* 113* 110  CO2  --   < > 23  < > $R'22 22 22 'DM$ 20* 21*  GLUCOSE  --   < > 125*  < > 110* 138* 162* 117* 121*  BUN  --   < > 46*  < > 50* 52* 51* 54* 54*  CREATININE  --   < > 3.81*  < > 3.87* 3.78* 3.45* 3.63* 3.63*  CALCIUM  --   < > 8.5*  < > 8.2* 8.3* 8.5* 8.7* 9.0  MG 2.2  --   --   --   --   --   --   --   --   PHOS  --   --  4.9*  --   --   --   --  4.3 4.9*   < > = values in this interval not displayed. Liver Function Tests:  Recent Labs Lab 10/03/14 0623 10/04/14 0002 10/05/14 1220 10/07/14 1250 10/08/14 0611  AST 65* 45* 39  --   --   ALT 85* 74* 68*  --   --   ALKPHOS 103 101 115  --   --   BILITOT 1.1 1.0 0.9  --   --   PROT 6.0* 5.8* 6.4*  --   --   ALBUMIN 3.1* 3.0* 3.2* 2.9* 3.1*   No results for  input(s): LIPASE, AMYLASE in the last 168 hours. No results for input(s): AMMONIA in the last 168 hours. CBC:  Recent Labs Lab 10/04/14 0002 10/04/14 0525 10/05/14 0724 10/06/14 0944 10/08/14 0802  WBC 3.9* 3.4* 3.7* 4.5 5.1  HGB 9.9* 9.1* 9.4* 9.6* 9.2*  HCT 29.8* 27.8* 28.2* 30.6* 29.0*  MCV 98.3 98.9 99.6 101.0* 100.7*  PLT 123* 124* 119* 119* 115*   Cardiac Enzymes: No results for input(s): CKTOTAL, CKMB, CKMBINDEX, TROPONINI in the last 168 hours. BNP: Invalid input(s): POCBNP CBG: No results for input(s): GLUCAP in the last 168 hours.  No results found for this or any previous visit (from the past 240 hour(s)).   Scheduled Meds: . aspirin EC  81 mg Oral Daily  . atorvastatin  80 mg Oral QHS  . doxycycline  100 mg Oral Q12H  . heparin  5,000 Units Subcutaneous 3 times per day  . metoprolol tartrate  25 mg Oral BID  . pantoprazole  40 mg Oral Q1200

## 2014-10-09 LAB — BASIC METABOLIC PANEL
ANION GAP: 10 (ref 5–15)
BUN: 57 mg/dL — ABNORMAL HIGH (ref 6–20)
CHLORIDE: 112 mmol/L — AB (ref 101–111)
CO2: 22 mmol/L (ref 22–32)
CREATININE: 3.61 mg/dL — AB (ref 0.44–1.00)
Calcium: 8.7 mg/dL — ABNORMAL LOW (ref 8.9–10.3)
GFR calc Af Amer: 13 mL/min — ABNORMAL LOW (ref >60)
GFR calc non Af Amer: 11 mL/min — ABNORMAL LOW (ref >60)
Glucose, Bld: 125 mg/dL — ABNORMAL HIGH (ref 65–99)
Potassium: 5.2 mmol/L — ABNORMAL HIGH (ref 3.5–5.1)
Sodium: 144 mmol/L (ref 135–145)

## 2014-10-09 LAB — CBC
HCT: 26.3 % — ABNORMAL LOW (ref 36.0–46.0)
Hemoglobin: 8.4 g/dL — ABNORMAL LOW (ref 12.0–15.0)
MCH: 32.4 pg (ref 26.0–34.0)
MCHC: 31.9 g/dL (ref 30.0–36.0)
MCV: 101.5 fL — ABNORMAL HIGH (ref 78.0–100.0)
Platelets: 98 10*3/uL — ABNORMAL LOW (ref 150–400)
RBC: 2.59 MIL/uL — AB (ref 3.87–5.11)
RDW: 15.7 % — AB (ref 11.5–15.5)
WBC: 4.4 10*3/uL (ref 4.0–10.5)

## 2014-10-09 MED ORDER — FUROSEMIDE 10 MG/ML IJ SOLN
40.0000 mg | Freq: Once | INTRAMUSCULAR | Status: AC
  Administered 2014-10-09: 40 mg via INTRAVENOUS
  Filled 2014-10-09: qty 4

## 2014-10-09 MED ORDER — BOOST / RESOURCE BREEZE PO LIQD
1.0000 | Freq: Every day | ORAL | Status: DC
  Administered 2014-10-09: 1 via ORAL

## 2014-10-09 MED ORDER — NEPRO/CARBSTEADY PO LIQD
237.0000 mL | Freq: Two times a day (BID) | ORAL | Status: DC
  Administered 2014-10-10: 237 mL via ORAL

## 2014-10-09 NOTE — Progress Notes (Signed)
Patient ID: [name] [name], female   DOB: January 23, 1941, 74 y.o.   MRN: [id number]  [name] [name] Progress Note    Assessment/ Plan:   1. AKI: Remains nonoliguric with stable renal function however, with volume overload-I had originally prescribed furosemide 40 mg IV twice a day however, she was reluctant to receive this dose because this was what had initially predispose to her renal insufficiency/presentation. I reviewed the overnight events and informed her of the meager response to 20 mg IV twice a day and have convinced her to 40 mg twice a day-first dose already prescribed by Dr. Broadus John. Suspect that management of her underlying myeloma will help her overall renal prognosis. 2. M spike/suspicion for plasma cell dyscrasia: Status post bone marrow biopsy yesterday and will be followed up as an outpatient in [place] by oncology/hematology for further management. 3. Hypertension: Blood pressures appear to be well controlled at this time on metoprolol monotherapy-monitor with diuresis 4. Anemia: For ESA therapy until after bone marrow biopsy, borderline iron stores-status post intravenous iron   Subjective:   Reports to be feeling tired and with poor appetite-still having metallic taste in her mouth    Objective:   BP 121/41 mmHg  Pulse 76  Temp(Src) 98.4 F (36.9 C) (Oral)  Resp 18  Ht $R'5\' 2"'cs$  (1.575 m)  Wt 79.243 kg (174 lb 11.2 oz)  BMI 31.94 kg/m2  SpO2 98%  Intake/Output Summary (Last 24 hours) at 10/09/14 1123 Last data filed at 10/09/14 0600  Gross per 24 hour  Intake    480 ml  Output   1250 ml  Net   -770 ml   Weight change: -0.257 kg (-9.1 oz)  Physical Exam: Gen: Somewhat uncomfortable resting in bed, daughter at bedside CVS: Pulse regular in rate and rhythm, S1 and S2 normal Resp: Fine rales bibasally otherwise clear Abd: Soft, obese, nontender Ext: Trace lower extremity edema  Imaging: Dg Chest 2 View  10/08/2014   CLINICAL DATA:  Shortness of  breath, weakness, hypoxia.  EXAM: CHEST  2 VIEW  COMPARISON:  09/28/2014  FINDINGS: Mild cardiomegaly and vascular congestion. Mild interstitial prominence could reflect interstitial edema. Trace right pleural effusion noted. Bibasilar opacities could also reflect edema or atelectasis.  Moderate compression fracture at T10, stable since prior MRI 05/28/2014.  IMPRESSION: Vascular congestion and interstitial prominence, likely interstitial edema.  Bibasilar edema or atelectasis.  Trace right effusion.   Electronically Signed   By: Rolm Baptise M.D.   On: 10/08/2014 10:32   Ct Biopsy  10/08/2014   CLINICAL DATA:  Multiple myeloma  EXAM: CT-GUIDED BIOPSY BONE MARROW ASPIRATE AND CORE.  MEDICATIONS AND MEDICAL HISTORY: Versed 1.5 mg, Fentanyl 25 mcg.  Additional Medications: None.  ANESTHESIA/SEDATION: Moderate sedation time: 15 minutes  PROCEDURE: The procedure, risks, benefits, and alternatives were explained to the patient. Questions regarding the procedure were encouraged and answered. The patient understands and consents to the procedure.  The back was prepped with Betadine in a sterile fashion, and a sterile drape was applied covering the operative field. A sterile gown and sterile gloves were used for the procedure.  Under CT guidance, an 11 gauge needle was inserted into the right iliac bone via posterior approach. Aspirates and a core were obtain. Final imaging was performed.  Patient tolerated the procedure well without complication. Vital sign monitoring by nursing staff during the procedure will continue as patient is in the special procedures unit for post procedure observation.  FINDINGS: The images document guide needle  placement within the right iliac bone. Post biopsy images demonstrate no hemorrhage.  COMPLICATIONS: None  IMPRESSION: Successful CT-guided bone marrow aspirate and core.   Electronically Signed   By: Marybelle Killings M.D.   On: 10/08/2014 12:45    Labs: BMET  Recent Labs Lab  10/03/14 7218 10/04/14 0002 10/04/14 0525 10/05/14 0724 10/06/14 0944 10/07/14 1250 10/08/14 0611 10/09/14 0525  NA 143 142 143 144 146* 146* 144 144  K 4.9 4.6 4.8 4.6 4.8 5.1 4.8 5.2*  CL 111 110 112* 112* 113* 113* 110 112*  CO2 23 19* $Remo'22 22 22 'xQAfT$ 20* 21* 22  GLUCOSE 125* 129* 110* 138* 162* 117* 121* 125*  BUN 46* 49* 50* 52* 51* 54* 54* 57*  CREATININE 3.81* 4.00* 3.87* 3.78* 3.45* 3.63* 3.63* 3.61*  CALCIUM 8.5* 8.2* 8.2* 8.3* 8.5* 8.7* 9.0 8.7*  PHOS 4.9*  --   --   --   --  4.3 4.9*  --    CBC  Recent Labs Lab 10/05/14 0724 10/06/14 0944 10/08/14 0802 10/09/14 0525  WBC 3.7* 4.5 5.1 4.4  HGB 9.4* 9.6* 9.2* 8.4*  HCT 28.2* 30.6* 29.0* 26.3*  MCV 99.6 101.0* 100.7* 101.5*  PLT 119* 119* 115* 98*   Medications:    . atorvastatin  80 mg Oral QHS  . docusate sodium  100 mg Oral BID  . feeding supplement (RESOURCE BREEZE)  1 Container Oral TID BM  . furosemide  40 mg Intravenous Once  . latanoprost  1 drop Both Eyes QHS  . metoprolol tartrate  50 mg Oral BID  . pantoprazole  40 mg Oral BID  . polyethylene glycol  17 g Oral BID  . regadenoson  0.4 mg Intravenous Once  . sodium chloride  3 mL Intravenous Q12H   Elmarie Shiley, MD 10/09/2014, 11:23 AM

## 2014-10-09 NOTE — Progress Notes (Signed)
Patient ID: [name] [name], female   DOB: 1941/04/29, 74 y.o.   MRN: [id number] [facility] PROGRESS NOTE  DINISHA CAI WUJ:[id number] DOB: [birth year] DOA: 09/28/2014 PCP: Neale Burly, MD  Brief narrative:    74 year old female with past medical history of chronic diastolic CHF, hypertrophy cardiomyopathy, extensive cardiac work up in past including right and left heart catheterization in 2010 (with 90% LAD lesion which was stented), COPD, hypertension, recent hospitalization in [facility] for acute diastolic CHF where she underwent pretty aggressive diureses. Patient now presented to [facility] ED with intermittent chest tightness, cough ever since discharge from [facility].   On this admission, she was found to have acute renal failure with creatinine level in 4 range (her baseline is normal creatinine). Initial troponin was WNL. Cardio has seen in her consultation and plans for stress test. Of note, CXR on admission showed possible early pulmonary edema. Stress test was low risk. Chest pain was thought to be secondary to anemia in setting of cardiomyopathy.   Patient presents with anemia and renal failure. UPEP, SPEP consistent with possible plasma cell disorder. Plan to proceed to Bone Marrow biopsy 6-1. patient might be able to be discharge after bone marrow if renal function and Hb stable.     Assessment/Plan:    Acute renal failure - Likely related to plasma cell disorder.  - creatinine this admission 4.1 - cr fluctuates 3.6 to 3.8 -SPEP, UPEP: Lambda light chain 9830. Mild M spike -Renal US; No evidence of hydronephrosis or bladder distention. Right nephrolithiasis.  -UA 30 gr protein.  -s/p BM biopsy yesterday.   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 Anemia panel: normal B-12, iron ferritin, peripheral smear negative for schitocytes.  -UPEP, SPEP: Lambda light chain 9830.   Mild M spike.  Hematology  Oncology  consulted, and appreciate input S/P  2 PRBC, hb stable now.  Bone survey only showed osteopenia. Prior compression fracture. Patient was aware of compression fracture.  hemoccult blood negative.  Bone Marrow biopsy 6/1, FU with Onc in [facility] Dr. Whitney Muse, FU arranged by Dr,Shadad  Encephalopathy:  Became confused, lethargic after phenergan. Avoid Phenergan.  Back to baseline.   Acute on chronic diastolic heart failure - Pt with history of diastolic CHF - 2 D ECHO on this admission showed normal EF , BNP 597 on this admission - Weight since admission, 77 kg --> 79.5 kg - with Pulm edema on CXR 6/1, given iv lasix 6/1 repeat dose today  Chest pain -resolved - In pt with extensive cardiac history and work up, troponin negtaive - Continue aspirin, Lipitor and metoprolol  - Cardiology signed off,  appreciate their recommendations  -NST; Normal study with normal perfusion. Low risk test. EF 65%.  Essential hypertension - Continue metoprolol 25 mg PO BID  Dyslipidemia - Continue statin therapy   DVT Prophylaxis  -scds.   Code Status: Full.  Family Communication:  plan of care discussed with the patient and extended family Disposition Plan: home tomorrow if stable.   IV access:  Peripheral IV  Procedures and diagnostic studies:    Ct Chest Wo Contrast 10-18-14 Patchy ground-glass airspace opacity with interlobular septal thickening and bronchial wall thickening most compatible with early pulmonary edema.  Moderate cardiomegaly.   Electronically Signed   By: Conchita Paris M.D.   On: Oct 18, 2014 10:42   US Renal 2014/10/18  Bilateral simple renal cysts. Right nephrolithiasis. Exam is otherwise normal. No evidence of  hydronephrosis or bladder distention.   Electronically Signed   By: Perry   On: 09/29/2014 10:14   Nm Myocar Multi W/spect Tamela Oddi Motion / Ef 09/30/2014  Dg Chest Port 1 View 09/28/2014  1.  No acute cardiopulmonary disease. 2. Grossly unchanged bibasilar  atelectasis/scar, left greater than right.     Medical Consultants:  Cardiology  Oncology.   Other Consultants:  None   IAnti-Infectives:      Domenic Polite, MD  [facility] Pager [phone]  Time spent in minutes: 25 minutes  If 7PM-7AM, please contact night-coverage [email or web address] Password [email or web address] 10/09/2014, 1:16 PM   LOS: 10 days    HPI/Subjective: Complains of dyspnea this am  Objective: Filed Vitals:   10/08/14 1725 10/08/14 2100 10/09/14 0506 10/09/14 0855  BP: 118/42 136/55 100/33 121/41  Pulse: 78 80 68 76  Temp: 98.2 F (36.8 C) 97.9 F (36.6 C) 98.2 F (36.8 C) 98.4 F (36.9 C)  TempSrc: Oral Oral Oral Oral  Resp: $Remo'18 20 18 18  'uRKsa$ Height:      Weight:  79.243 kg (174 lb 11.2 oz)    SpO2: 98% 100% 98% 98%    Intake/Output Summary (Last 24 hours) at 10/09/14 1316 Last data filed at 10/09/14 0600  Gross per 24 hour  Intake    480 ml  Output   1250 ml  Net   -770 ml    Exam:   General:  Pt is alert, follows commands appropriately, not in acute distress  Cardiovascular: Regular rate and rhythm, S1/S2 (+)  Respiratory: diminished breath sounds bilaterally, no wheezing, no crackles, no rhonchi  Abdomen: Soft, non tender, non distended, bowel sounds present  Extremities: No edema, pulses DP and PT palpable bilaterally  Data Reviewed: Basic Metabolic Panel:  Recent Labs Lab 10/03/14 0623  10/05/14 0724 10/06/14 0944 10/07/14 1250 10/08/14 0611 10/09/14 0525  NA 143  < > 144 146* 146* 144 144  K 4.9  < > 4.6 4.8 5.1 4.8 5.2*  CL 111  < > 112* 113* 113* 110 112*  CO2 23  < > 22 22 20* 21* 22  GLUCOSE 125*  < > 138* 162* 117* 121* 125*  BUN 46*  < > 52* 51* 54* 54* 57*  CREATININE 3.81*  < > 3.78* 3.45* 3.63* 3.63* 3.61*  CALCIUM 8.5*  < > 8.3* 8.5* 8.7* 9.0 8.7*  PHOS 4.9*  --   --   --  4.3 4.9*  --   < > = values in this interval not displayed. Liver Function Tests:  Recent Labs Lab 10/03/14 0623 10/04/14 0002 10/05/14 1220  10/07/14 1250 10/08/14 0611  AST 65* 45* 39  --   --   ALT 85* 74* 68*  --   --   ALKPHOS 103 101 115  --   --   BILITOT 1.1 1.0 0.9  --   --   PROT 6.0* 5.8* 6.4*  --   --   ALBUMIN 3.1* 3.0* 3.2* 2.9* 3.1*   No results for input(s): LIPASE, AMYLASE in the last 168 hours. No results for input(s): AMMONIA in the last 168 hours. CBC:  Recent Labs Lab 10/04/14 0525 10/05/14 0724 10/06/14 0944 10/08/14 0802 10/09/14 0525  WBC 3.4* 3.7* 4.5 5.1 4.4  HGB 9.1* 9.4* 9.6* 9.2* 8.4*  HCT 27.8* 28.2* 30.6* 29.0* 26.3*  MCV 98.9 99.6 101.0* 100.7* 101.5*  PLT 124* 119* 119* 115* 98*   Cardiac Enzymes: No results for input(s): CKTOTAL, CKMB, CKMBINDEX, TROPONINI in  the last 168 hours. BNP: Invalid input(s): POCBNP CBG: No results for input(s): GLUCAP in the last 168 hours.  No results found for this or any previous visit (from the past 240 hour(s)).   Scheduled Meds: . aspirin EC  81 mg Oral Daily  . atorvastatin  80 mg Oral QHS  . doxycycline  100 mg Oral Q12H  . heparin  5,000 Units Subcutaneous 3 times per day  . metoprolol tartrate  25 mg Oral BID  . pantoprazole  40 mg Oral Q1200

## 2014-10-09 NOTE — Progress Notes (Signed)
Nutrition Follow-up  DOCUMENTATION CODES:  Obesity unspecified  INTERVENTION:  Nepro shake, Boost Breeze   Encourage adequate PO intake.   NUTRITION DIAGNOSIS:  Inadequate oral intake related to nausea as evidenced by  (varied meal completion of 0-100%); ongoing  GOAL:  Patient will meet greater than or equal to 90% of their needs; progressing  MONITOR:  PO intake, Supplement acceptance, Weight trends, Labs, I & O's  REASON FOR ASSESSMENT:  Consult Poor PO  ASSESSMENT: Pt has a past medical history of HTN; Hyperlipidemia; Hypertrophic cardiomyopathy; CAD; COPD; Obesity; Pre-syncope; and CHF. Presented with shortness of breath and chest tightness. Pt found to have new onset of acute renal failure. S/p bone marrow biopsy 6/1.  Pt reports having a lack of appetite. Meal completion has been 10-15%. Pt has been consuming her Resource Breeze at time however reports she would like to try alternative supplements. RD to order Nepro Shake. Pt was encouraged to eat her food at meals and to drink her supplements. RD to modify orders. Family at bedside, they report they will encourage pt increase her po intake.   Labs: Low calcium and GFR. High BUN, creatinine, potassium (5.2), and creatinine.   Height:  Ht Readings from Last 1 Encounters:  10/01/14 $RemoveB'5\' 2"'hTZWbJRW$  (1.575 m)    Weight:  Wt Readings from Last 1 Encounters:  10/08/14 174 lb 11.2 oz (79.243 kg)    Ideal Body Weight:  50 kg  Wt Readings from Last 10 Encounters:  10/08/14 174 lb 11.2 oz (79.243 kg)  06/04/14 169 lb (76.658 kg)  05/22/13 183 lb (83.008 kg)  10/10/12 175 lb (79.379 kg)  09/26/12 167 lb (75.751 kg)  07/20/12 176 lb 6.4 oz (80.015 kg)  05/31/11 173 lb 12.8 oz (78.835 kg)  09/13/10 175 lb (79.379 kg)  02/15/10 173 lb (78.472 kg)  11/20/09 173 lb (78.472 kg)    BMI:  Body mass index is 31.94 kg/(m^2).  Estimated Nutritional Needs:  Kcal:  1700-1900  Protein:  80-95 grams  Fluid:  1.7 - 1.9  L/day  Skin:   (Incision on R hip)  Diet Order:  Diet regular Room service appropriate?: Yes; Fluid consistency:: Thin  EDUCATION NEEDS:  No education needs identified at this time   Intake/Output Summary (Last 24 hours) at 10/09/14 1520 Last data filed at 10/09/14 1359  Gross per 24 hour  Intake    720 ml  Output   1200 ml  Net   -480 ml    Last BM:  6/1  Kallie Locks, MS, RD, LDN Pager # [phone] After hours/ weekend pager # [phone]

## 2014-10-10 LAB — CBC
HCT: 28.1 % — ABNORMAL LOW (ref 36.0–46.0)
Hemoglobin: 8.9 g/dL — ABNORMAL LOW (ref 12.0–15.0)
MCH: 32.1 pg (ref 26.0–34.0)
MCHC: 31.7 g/dL (ref 30.0–36.0)
MCV: 101.4 fL — AB (ref 78.0–100.0)
Platelets: 95 10*3/uL — ABNORMAL LOW (ref 150–400)
RBC: 2.77 MIL/uL — ABNORMAL LOW (ref 3.87–5.11)
RDW: 15.4 % (ref 11.5–15.5)
WBC: 5.3 10*3/uL (ref 4.0–10.5)

## 2014-10-10 LAB — BASIC METABOLIC PANEL
Anion gap: 12 (ref 5–15)
BUN: 62 mg/dL — ABNORMAL HIGH (ref 6–20)
CHLORIDE: 111 mmol/L (ref 101–111)
CO2: 23 mmol/L (ref 22–32)
CREATININE: 3.64 mg/dL — AB (ref 0.44–1.00)
Calcium: 9.3 mg/dL (ref 8.9–10.3)
GFR, EST AFRICAN AMERICAN: 13 mL/min — AB (ref >60)
GFR, EST NON AFRICAN AMERICAN: 11 mL/min — AB (ref >60)
GLUCOSE: 124 mg/dL — AB (ref 65–99)
POTASSIUM: 5 mmol/L (ref 3.5–5.1)
Sodium: 146 mmol/L — ABNORMAL HIGH (ref 135–145)

## 2014-10-10 MED ORDER — ONDANSETRON 4 MG PO TBDP: 4.0000 mg | ORAL_TABLET | Freq: Three times a day (TID) | ORAL | Status: DC | PRN

## 2014-10-10 MED ORDER — PANTOPRAZOLE SODIUM 40 MG PO TBEC: 40.0000 mg | DELAYED_RELEASE_TABLET | Freq: Every day | ORAL | Status: AC

## 2014-10-10 MED ORDER — POLYETHYLENE GLYCOL 3350 17 G PO PACK: 17.0000 g | PACK | Freq: Every day | ORAL | Status: DC | PRN

## 2014-10-10 MED ORDER — FUROSEMIDE 40 MG PO TABS: 40.0000 mg | ORAL_TABLET | Freq: Every day | ORAL | Status: DC

## 2014-10-10 NOTE — Progress Notes (Signed)
Patient Discharge: Disposition: Patient discharged to home with the family. Education: Educated about follow-up appointments, medications, prescriptions and discharge instructions, and also about daily weights. IV: Discontinued Iv before discharge. Telemetry: Discontinued before discharge.  CCMD notified. Transportation: Patient transported in w/c with the family and staff accompanying. Belongings: Patient took all her belongings with her.

## 2014-10-10 NOTE — Discharge Summary (Signed)
Physician Discharge Summary  [name] [name] XIH:[id number] DOB: 1940/10/18 DOA: 09/28/2014  PCP: Neale Burly, MD  Admit date: 09/28/2014 Discharge date: 10/10/2014  Time spent: 45 minutes  Recommendations for Outpatient Follow-up:  1. Dr.Webb 6/28, please titrate Lasix dose upon FU 2. Dr.Penland at [facility] to be arranged per Dr.Shadad 3. 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 female with PMH of HTN, Hypertrophic cardiomyopathy; CAD,  COPD , Obesity; Pre-syncope; and chronic diastolic CHF was admitted with  shortness of breath and chest tightness. She reported worsening cough for the past few days.She had been struggling to do basic activities due to shortness of breath. No wheezing. Patient had been recently admitted to [facility] from May 17 to May 19. She has been aggressively diuresed during her hospital stay. At time of discharge creatinine 1.11. BNP 2400. Echogram showed preserved EF of 65% with hypertrophy of left ventricle septum. Patient continued to have intermittent chest tightness. In emergency department she was found to have new onset of acute renal failure with creatinine of 4.1 chest extremities no evidence of infiltrate. Troponin unremarkable. EKG worrisome for some ST depressions.  Hospital Course:   Acute renal failure - Likely related to plasma cell disorder.  - creatinine this admission 4.1, slightly improved to 3.6 range at discharge, seen by Renal,  -SPEP, UPEP: with Lambda light chain 9830. Mild M spike ; possible light chain myeloma  -Renal US; No evidence of hydronephrosis or bladder distention. Right nephrolithiasis.  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  -UPEP, SPEP: Lambda light chain 9830. Mild M spike.  Hematology Oncology consulted,  S/P 2 PRBC, hb stable now  In 8-9 range Bone survey only showed osteopenia. Prior compression fracture. hemoccult blood negative.  Underwent Bone Marrow biopsy 6/1, results pending; FU with Onc in [facility] Dr. Whitney Muse, FU arranged by Dr,Shadad  Encephalopathy:  -Became confused, lethargic after phenergan.   -resolved, Back to baseline.   Acute on chronic diastolic heart failure - Pt with history of diastolic CHF - 2 D ECHO on this admission showed normal EF , BNP 597 on this admission - Weight since admission, 77 kg --> 79.5 kg - with Pulm edema on CXR 6/1, given iv lasix 6/1 and 6/2, now improved and lasix changed to $RemoveBe'40mg'JuGaxwdNU$  Po daily per Renal recommendations  Chest pain -resolved -  pt with extensive cardiac history and work up,  troponin negtaive - Continue aspirin, Lipitor and metoprolol  - was seen by Cardiology  -NST; Normal study with normal perfusion. Low risk test. EF 65%.  Essential hypertension - Continue metoprolol 25 mg PO BID, amlodipine and losartan stopped,   Dyslipidemia - Continue statin therapy    Procedures:  Bone  Marrow aspiration and biopsy  Myoview  Consultations:  Renal  Dr.Shadad  Discharge Exam: Filed Vitals:   10/10/14 0950  BP: 99/50  Pulse: 99  Temp: 98 F (36.7 C)  Resp: 18    General: AAOx3 no distress Cardiovascular: S1S2/RRR Respiratory: CTAB  Discharge Instructions   Discharge Instructions    Diet - low sodium heart healthy    Complete by:  As directed      Increase activity slowly    Complete by:  As directed           Current Discharge Medication List    START taking these medications   Details  pantoprazole (PROTONIX) 40 MG tablet Take 1 tablet (40 mg total) by mouth daily. Qty: 30 tablet, Refills: 0    polyethylene glycol (MIRALAX / GLYCOLAX) packet Take 17 g by mouth daily as needed for moderate constipation. Qty: 14 each, Refills: 0      CONTINUE these medications which have CHANGED   Details  furosemide (LASIX) 40 MG tablet Take 1 tablet (40 mg total) by mouth daily. Qty: 28 tablet, Refills: 0      CONTINUE these medications which have NOT CHANGED   Details  aspirin 81 MG EC tablet Take 81 mg by mouth daily.      atorvastatin (LIPITOR) 80 MG tablet Take 80 mg by mouth at bedtime.     Calcium Carbonate-Vitamin D (CALCIUM 600 + D PO) Take 600 mg by mouth daily.    denosumab (PROLIA) 60 MG/ML SOLN injection Inject 60 mg into the skin every 6 (six) months. Administer in upper arm, thigh, or abdomen    latanoprost (XALATAN) 0.005 % ophthalmic solution Place 1 drop into both eyes at bedtime.    metoprolol tartrate (LOPRESSOR) 25 MG tablet TAKE 1 TABLET BY MOUTH TWO TIMES A DAY Qty: 180 tablet, Refills: 3      STOP taking these medications     amLODipine (NORVASC) 5 MG tablet      azithromycin (ZITHROMAX) 250 MG tablet      losartan (COZAAR) 100 MG tablet        Allergies  Allergen Reactions  . Morphine Shortness Of Breath    Eyes became bloodshot  . Penicillins Rash   Follow-up Information    Follow up with Garnetta Buddy, MD On  10/29/2014.   Specialty:  Nephrology   Why:  CHECK IN AT 10:45   Contact information:   [address] [place] [address] [phone]        The results of significant diagnostics from this hospitalization (including imaging, microbiology, ancillary and laboratory) are listed below for reference.    Significant Diagnostic Studies: Dg Chest 2 View  10/08/2014   CLINICAL DATA:  Shortness of breath, weakness, hypoxia.  EXAM: CHEST  2 VIEW  COMPARISON:  09/28/2014  FINDINGS: Mild cardiomegaly and vascular congestion. Mild interstitial prominence could reflect interstitial edema. Trace right pleural effusion noted. Bibasilar opacities could also reflect edema or atelectasis.  Moderate compression fracture at T10, stable since prior MRI 05/28/2014.  IMPRESSION: Vascular congestion and interstitial prominence, likely interstitial edema.  Bibasilar edema or atelectasis.  Trace right effusion.   Electronically Signed  By: Rolm Baptise M.D.   On: 10/08/2014 10:32   Dg Abd 1 View  10/04/2014   CLINICAL DATA:  Abdominal distention  EXAM: ABDOMEN - 1 VIEW  COMPARISON:  None.  FINDINGS: Bilateral re- opacities over the kidneys are most compatible with renal calculi. Normal bowel gas pattern. Mild stool burden. No acute osseous abnormality.  IMPRESSION: Bilateral renal calculi.  Normal bowel gas pattern.   Electronically Signed   By: Conchita Paris M.D.   On: 10/04/2014 12:24   Ct Chest Wo Contrast  09/29/2014   CLINICAL DATA:  Shortness of breath and nonproductive cough  EXAM: CT CHEST WITHOUT CONTRAST  TECHNIQUE: Multidetector CT imaging of the chest was performed following the standard protocol without IV contrast.  COMPARISON:  Chest radiograph 09/28/2014, thoracic MRI 05/28/2014  FINDINGS: Mediastinum/Nodes: Moderate atheromatous aortic and coronary arterial calcification without aneurysm. Moderate cardiac enlargement is noted. No pericardial effusion. No lymphadenopathy. Probable vascular at ectasia at  the level of the right brachiocephalic artery/subclavian takeoff image 4.  Lungs/Pleura: Areas of patchy ground-glass airspace opacity are identified with interlobular septal thickening and central bronchial wall thickening. Central airways are patent. Trace pleural fluid.  Upper abdomen: The upper abdominal viscera unremarkable where visualized.  Musculoskeletal: T10 vertebra plana reidentified. No new acute osseous abnormality. The bones are subjectively osteopenic.  IMPRESSION: Patchy ground-glass airspace opacity with interlobular septal thickening and bronchial wall thickening most compatible with early pulmonary edema.  Moderate cardiomegaly.   Electronically Signed   By: Conchita Paris M.D.   On: 09/29/2014 10:42   US Renal  09/29/2014   CLINICAL DATA:  Acute renal failure.  EXAM: RENAL / URINARY TRACT ULTRASOUND COMPLETE  COMPARISON:  None.  FINDINGS: Right Kidney:  Length: 11.8 cm. Echogenicity within normal limits. No hydronephrosis visualized. 8 mm cyst right upper renal pole. Right nephrolithiasis noted.  Left Kidney:  Length: 12.1 cm. Echogenicity within normal limits. No hydronephrosis visualized. 8 mm cyst left lower renal pole.  Bladder:  Appears normal for degree of bladder distention.  IMPRESSION: Bilateral simple renal cysts. Right nephrolithiasis. Exam is otherwise normal. No evidence of hydronephrosis or bladder distention.   Electronically Signed   By: Marcello Moores  Register   On: 09/29/2014 10:14   Nm Myocar Multi W/spect W/wall Motion / Ef  09/30/2014    The study is normal.  This is a low risk study.  The left ventricular ejection fraction is hyperdynamic (>65%).  There was no ST segment deviation noted during stress.   Normal Lexiscan myovue with no ischemia or infarction EF 69%   Ct Biopsy  10/08/2014   CLINICAL DATA:  Multiple myeloma  EXAM: CT-GUIDED BIOPSY BONE MARROW ASPIRATE AND CORE.  MEDICATIONS AND MEDICAL HISTORY: Versed 1.5 mg, Fentanyl 25 mcg.  Additional Medications:  None.  ANESTHESIA/SEDATION: Moderate sedation time: 15 minutes  PROCEDURE: The procedure, risks, benefits, and alternatives were explained to the patient. Questions regarding the procedure were encouraged and answered. The patient understands and consents to the procedure.  The back was prepped with Betadine in a sterile fashion, and a sterile drape was applied covering the operative field. A sterile gown and sterile gloves were used for the procedure.  Under CT guidance, an 11 gauge needle was inserted into the right iliac bone via posterior approach. Aspirates and a core were obtain. Final imaging was performed.  Patient tolerated the procedure well without complication. Vital sign monitoring by nursing staff during the procedure will continue as patient is in the special procedures unit  for post procedure observation.  FINDINGS: The images document guide needle placement within the right iliac bone. Post biopsy images demonstrate no hemorrhage.  COMPLICATIONS: None  IMPRESSION: Successful CT-guided bone marrow aspirate and core.   Electronically Signed   By: Marybelle Killings M.D.   On: 10/08/2014 12:45   Dg Chest Port 1 View  09/28/2014   CLINICAL DATA:  Shortness of breath and chest pressure with dry cough for the past 3 weeks. Recent diagnosis of congestive heart failure.  EXAM: PORTABLE CHEST - 1 VIEW  COMPARISON:  09/23/2014; 09/22/2012  FINDINGS: Grossly unchanged enlarged cardiac silhouette and mediastinal contours with partially calcified right suprahilar lymph nodes. Grossly unchanged bibasilar heterogeneous opacities, left greater than right, likely atelectasis or scar. No new focal airspace opacities. No pleural effusion or pneumothorax. No evidence of edema. No acute osseus abnormalities.  IMPRESSION: 1.  No acute cardiopulmonary disease. 2. Grossly unchanged bibasilar atelectasis/scar, left greater than right.   Electronically Signed   By: Sandi Mariscal M.D.   On: 09/28/2014 22:15   Dg Bone Survey  Met  10/02/2014   CLINICAL DATA:  Renal failure. Clinical concern for multiple myeloma.  EXAM: METASTATIC BONE SURVEY  COMPARISON:  None.  FINDINGS: Skull:  No lytic lesions.  Cervical spine: Degenerative changes, reversal of the normal lordosis, mild anterolisthesis at the C3-4 level and mild retrolisthesis at the C4-5 level and mild anterolisthesis at the C6-7 level. No lytic lesions.  Thoracic spine: Approximately 60% T10 vertebral compression deformity with anterior and posterior sclerosis and visible fracture line anteriorly. Approximately 30% T11 vertebral compression deformity with no definite acute fracture lines seen. No lytic lesions.  Lumbar spine mild degenerative changes with no lytic lesions.  Bilateral shoulders:  No lytic lesions.  Bilateral upper arms:  No lytic lesions.  Bilateral forearms:  No lytic lesions.  Chest: Enlarged cardiac silhouette. Diffusely prominent interstitial markings with mild diffuse peribronchial thickening. No lytic lesions.  Pelvis: No lytic lesions.  Femurs: No lytic lesions.  Lower legs: No lytic lesions. Right knee degenerative changes and old lateral tibial plateau compression deformity.  Diffuse osteopenia is noted involving all of the bones.  IMPRESSION: 1. Diffuse osteopenia with no lytic lesions seen. 2. Approximately 60% T10 vertebral compression deformity with chronic and possible acute components. 3. Approximately 40% T11 vertebral compression deformity. 4. Cardiomegaly. 5. Chronic bronchitic changes with chronic interstitial lung disease. 6. Right knee degenerative changes and old lateral tibial plateau compression deformity.   Electronically Signed   By: Claudie Revering M.D.   On: 10/02/2014 15:50    Microbiology: No results found for this or any previous visit (from the past 240 hour(s)).   Labs: Basic Metabolic Panel:  Recent Labs Lab 10/06/14 0944 10/07/14 1250 10/08/14 0611 10/09/14 0525 10/10/14 0700  NA 146* 146* 144 144 146*  K 4.8 5.1 4.8  5.2* 5.0  CL 113* 113* 110 112* 111  CO2 22 20* 21* 22 23  GLUCOSE 162* 117* 121* 125* 124*  BUN 51* 54* 54* 57* 62*  CREATININE 3.45* 3.63* 3.63* 3.61* 3.64*  CALCIUM 8.5* 8.7* 9.0 8.7* 9.3  PHOS  --  4.3 4.9*  --   --    Liver Function Tests:  Recent Labs Lab 10/04/14 0002 10/05/14 1220 10/07/14 1250 10/08/14 0611  AST 45* 39  --   --   ALT 74* 68*  --   --   ALKPHOS 101 115  --   --   BILITOT 1.0 0.9  --   --  PROT 5.8* 6.4*  --   --   ALBUMIN 3.0* 3.2* 2.9* 3.1*   No results for input(s): LIPASE, AMYLASE in the last 168 hours. No results for input(s): AMMONIA in the last 168 hours. CBC:  Recent Labs Lab 10/05/14 0724 10/06/14 0944 10/08/14 0802 10/09/14 0525 10/10/14 0700  WBC 3.7* 4.5 5.1 4.4 5.3  HGB 9.4* 9.6* 9.2* 8.4* 8.9*  HCT 28.2* 30.6* 29.0* 26.3* 28.1*  MCV 99.6 101.0* 100.7* 101.5* 101.4*  PLT 119* 119* 115* 98* 95*   Cardiac Enzymes: No results for input(s): CKTOTAL, CKMB, CKMBINDEX, TROPONINI in the last 168 hours. BNP: BNP (last 3 results)  Recent Labs  09/28/14 2217  BNP 597.6*    ProBNP (last 3 results) No results for input(s): PROBNP in the last 8760 hours.  CBG: No results for input(s): GLUCAP in the last 168 hours.     SignedDomenic Polite  [facility] 10/10/2014, 11:43 AM

## 2014-10-10 NOTE — Progress Notes (Signed)
Patient ID: [name] [name], female   DOB: Aug 20, 1940, 74 y.o.   MRN: [id number]  [facility] Progress Note    Assessment/ Plan:   1. AKI: Remains nonoliguric with stable renal function however, with volume overload- Suspect that management of her underlying myeloma will help her overall renal prognosis. Renal function remained stable following escalation of diuresis that was prompted by her volume status/respiratory difficulties-clinically she is doing better and creatinine unchanged on Lasix. Would recommend discharging her on 40 mg Lasix once a day by mouth-she'll be seen in follow-up by Dr. Justin Mend who will assist with making further changes 2. M spike/suspicion for plasma cell dyscrasia: Status post bone marrow biopsy earlier in the week and will be followed up as an outpatient in [place] by oncology/hematology for further management. 3. Hypertension: Blood pressures appear to be well controlled at this time on metoprolol monotherapy-monitor with diuresis 4. Anemia: For ESA therapy until after bone marrow biopsy, borderline iron stores-status post intravenous iron  Subjective:   Nauseated this morning and received some Phenergan that has made her a little sedated    Objective:   BP 101/48 mmHg  Pulse 100  Temp(Src) 98.3 F (36.8 C) (Oral)  Resp 18  Ht _0  (1.575 m)  Wt 78.109 kg (172 lb 3.2 oz)  BMI 31.49 kg/m2  SpO2 94%  Intake/Output Summary (Last 24 hours) at 10/10/14 1125 Last data filed at 10/10/14 0600  Gross per 24 hour  Intake    900 ml  Output   2050 ml  Net  -1150 ml   Weight change: -1.134 kg (-2 lb 8 oz)  Physical Exam: Gen: Somnolent and resting comfortably in bed CVS: Pulse regular tachycardia, S1 and S2 normal Resp: Fine rales left base otherwise clear to auscultation Abd: Soft, obese, nontender Ext: Trace to 1+ lower extremity edema  Imaging: No results found.  Labs: BMET  Recent Labs Lab 10/04/14 0525 10/05/14 0724  10/06/14 0944 10/07/14 1250 10/08/14 0611 10/09/14 0525 10/10/14 0700  NA 143 144 146* 146* 144 144 146*  K 4.8 4.6 4.8 5.1 4.8 5.2* 5.0  CL 112* 112* 113* 113* 110 112* 111  CO2 _1 20* 21* 22 23  GLUCOSE 110* 138* 162* 117* 121* 125* 124*  BUN 50* 52* 51* 54* 54* 57* 62*  CREATININE 3.87* 3.78* 3.45* 3.63* 3.63* 3.61* 3.64*  CALCIUM 8.2* 8.3* 8.5* 8.7* 9.0 8.7* 9.3  PHOS  --   --   --  4.3 4.9*  --   --    CBC  Recent Labs Lab 10/06/14 0944 10/08/14 0802 10/09/14 0525 10/10/14 0700  WBC 4.5 5.1 4.4 5.3  HGB 9.6* 9.2* 8.4* 8.9*  HCT 30.6* 29.0* 26.3* 28.1*  MCV 101.0* 100.7* 101.5* 101.4*  PLT 119* 115* 98* 95*    Medications:    . atorvastatin  80 mg Oral QHS  . docusate sodium  100 mg Oral BID  . feeding supplement (NEPRO CARB STEADY)  237 mL Oral BID BM  . feeding supplement (RESOURCE BREEZE)  1 Container Oral Q1500  . latanoprost  1 drop Both Eyes QHS  . metoprolol tartrate  50 mg Oral BID  . pantoprazole  40 mg Oral BID  . polyethylene glycol  17 g Oral BID  . regadenoson  0.4 mg Intravenous Once  . sodium chloride  3 mL Intravenous Q12H   Elmarie Shiley, MD 10/10/2014, 11:25 AM

## 2014-10-16 ENCOUNTER — Encounter: Payer: Medicare Other | Admitting: Physician Assistant

## 2014-10-20 LAB — CHROMOSOME ANALYSIS, BONE MARROW

## 2014-10-20 LAB — TISSUE HYBRIDIZATION (BONE MARROW)-NCBH

## 2014-10-22 ENCOUNTER — Ambulatory Visit (HOSPITAL_COMMUNITY): Payer: Medicare Other | Admitting: Hematology & Oncology

## 2014-10-31 ENCOUNTER — Encounter (HOSPITAL_COMMUNITY): Payer: Self-pay

## 2014-11-04 NOTE — Progress Notes (Signed)
This encounter was created in error - please disregard.

## 2014-11-17 ENCOUNTER — Encounter: Payer: Self-pay | Admitting: Physician Assistant

## 2014-11-17 ENCOUNTER — Ambulatory Visit (INDEPENDENT_AMBULATORY_CARE_PROVIDER_SITE_OTHER): Payer: Medicare Other | Admitting: Physician Assistant

## 2014-11-17 VITALS — BP 110/50 | HR 81 | Ht 62.0 in | Wt 161.0 lb

## 2014-11-17 DIAGNOSIS — I480 Paroxysmal atrial fibrillation: Secondary | ICD-10-CM

## 2014-11-17 DIAGNOSIS — I10 Essential (primary) hypertension: Secondary | ICD-10-CM

## 2014-11-17 DIAGNOSIS — I5032 Chronic diastolic (congestive) heart failure: Secondary | ICD-10-CM | POA: Diagnosis not present

## 2014-11-17 DIAGNOSIS — I2510 Atherosclerotic heart disease of native coronary artery without angina pectoris: Secondary | ICD-10-CM

## 2014-11-17 DIAGNOSIS — C9000 Multiple myeloma not having achieved remission: Secondary | ICD-10-CM

## 2014-11-17 DIAGNOSIS — I421 Obstructive hypertrophic cardiomyopathy: Secondary | ICD-10-CM | POA: Diagnosis not present

## 2014-11-17 NOTE — Assessment & Plan Note (Signed)
Patient was recently diagnosed with this and is undergoing chemotherapy and followed by oncologist in [place]

## 2014-11-17 NOTE — Assessment & Plan Note (Signed)
Patient had recent 2-D echo showing grade 2 diastolic dysfunction with severe basal septal hypertrophy LVOT gradients 28 mmHg 128 mm with Valsalva. She has multiple other medical problems right now including acute renal failure requiring dialysis and multiple myeloma requiring chemotherapy. She would like to be followed in the [facility] for convenience and I will schedule her an appointment to see Dr.Koneswaran in [place]

## 2014-11-17 NOTE — Assessment & Plan Note (Signed)
Patient's had trouble with hypotension and her medications have been lowered. On low-dose metoprolol

## 2014-11-17 NOTE — Assessment & Plan Note (Signed)
Patient is now on dialysis.

## 2014-11-17 NOTE — Assessment & Plan Note (Signed)
Heart failure is well compensated since starting dialysis

## 2014-11-17 NOTE — Patient Instructions (Addendum)
Medication Instructions:   Your physician recommends that you continue on your current medications as directed. Please refer to the Current Medication list given to you today.    Lab  PT/INR   Testing/Procedures:   Follow-Up:  NEEDS A COUMADIN APPOINTMENT IN [place] OFFICE WITH LISA REID TOMORROW   WITH DR Royston Sinner IN  [place] IN ONE MONTH OR NEXT AVAILABLE AS SOON AS POSSIBLE      Any Other Special Instructions Will Be Listed Below (If Applicable).

## 2014-11-17 NOTE — Progress Notes (Signed)
Cardiology Office Note   Date:  11/17/2014   ID:  [name] [name], DOB May 16, 1940, MRN [id number]  PCP:  [name] Burly, MD  Cardiologist:  [name] [name], M.D./Dr. Bensimhon  Chief Complaint: fatigue    History of Present Illness: [name] [name] is a 74 y.o. female who presents for post hospital follow-up. She has history of hypertension, diastolic heart failure, hypertrophic cardiomyopathy, CAD(stent LAD 2010), COPD. [facility] with coughing and intermittent chest tightness. She had significant jump in her creatinine after discharge for [facility] for heart failure in 5/19. Her creatinine increased from 1.11-4.1. 2-D echo shows preserved sore EF grade 2 diastolic dysfunction. She had low risk stress test on 09/30/14. With her severe hypertrophic cardiomyopathy. Once her renal function improves she needs to  be assessed by Dr. Burt Knack to see if she's a candidate for ablation or myomectomy if her symptoms persist. She had some bursts of SVT on telemetry and her metoprolol was increased to 50 mg twice a day.Discharge weight 172 pounds .Patient was diagnosed with multiple myeloma. Hematology oncology following.  One day after discharge she became short of breath and was admitted to [facility] and family requested she be transferred to [facility] for a second opinion. She was admitted June 5. She underwent dialysis and went into atrial fibrillation with rates up to 200 and positive troponins-demand ishemia. She was placed on Coumadin and metoprolol 25 mg 1/2 BID. Patient is now receiving dialysis in the [place] 3 days a week. She will have a permanent fistula placed at [facility]. She wants to continue her cardiac care with [place]. It would be more convenient if she was seen in [place] and she will need [place] to follow her Coumadin as well. Her heart failure has been stable and she has not had any racing heart rates. Her blood pressure drops during dialysis and they sometimes have to stop for her to  recover. She denies any chest pain, palpitations, dyspnea. She has chronic fatigue and a chronic cough that has gotten a little better. She is also receiving chemotherapy in [place].    Past Medical History  Diagnosis Date  . HTN (hypertension)   . Hyperlipidemia   . Hypertrophic cardiomyopathy     Mild hypertrophic CM without outflow tract obstruction -- ETT/Myoview EF 74%. no ischemia (12/2006)  --stress echo 8/10: normal BP response to exercise. no presyncope  no wall motion abnormalties. No significant LVOT obstruction. non-diagnositc ST depression  --holter monitor normal   . CAD (coronary artery disease)     --s/p DES LAD and POBA diagonal 11/10  --RHC cath normal  . COPD (chronic obstructive pulmonary disease)   . Obesity   . Pre-syncope   . CHF (congestive heart failure)     No past surgical history on file.   Current Outpatient Prescriptions  Medication Sig Dispense Refill  . amiodarone (PACERONE) 200 MG tablet Take 200 mg by mouth daily.     Marland Kitchen aspirin 81 MG EC tablet Take 81 mg by mouth daily.      Marland Kitchen atorvastatin (LIPITOR) 80 MG tablet Take 80 mg by mouth at bedtime.     . benzonatate (TESSALON) 100 MG capsule Take 100 mg by mouth 3 (three) times daily.     Marland Kitchen denosumab (PROLIA) 60 MG/ML SOLN injection Inject 60 mg into the skin every 6 (six) months. Administer in upper arm, thigh, or abdomen    . latanoprost (XALATAN) 0.005 % ophthalmic solution Place 1 drop into both  eyes at bedtime.    . metoprolol tartrate (LOPRESSOR) 25 MG tablet TAKE 1 TABLET BY MOUTH TWO TIMES A DAY (Patient taking differently: Take 1/2 tablet by mouth nightly) 180 tablet 3  . ondansetron (ZOFRAN) 4 MG tablet Take 4 mg by mouth 2 (two) times daily.     . pantoprazole (PROTONIX) 40 MG tablet Take 1 tablet (40 mg total) by mouth daily. 30 tablet 0  . senna-docusate (SENOKOT-S) 8.6-50 MG per tablet Take 1 tablet by mouth 2 (two) times daily.     Marland Kitchen warfarin (COUMADIN) 4 MG tablet Take by mouth.     No  current facility-administered medications for this visit.    Allergies:   Morphine; Phenergan; and Penicillins    Social History:  The patient  reports that she quit smoking about 14 years ago. Her smoking use included Cigarettes. She does not have any smokeless tobacco history on file. She reports that she does not drink alcohol or use illicit drugs.   Family History:  The patient's family history includes CAD in her brother and mother; Diabetes type II in her sister; Multiple myeloma in her father; Stroke in her sister; Uterine cancer in her sister.    ROS:  Please see the history of present illness.   Otherwise, review of systems are positive for none.   All other systems are reviewed and negative.    PHYSICAL EXAM: VS:  BP 110/50 mmHg  Pulse 81  Ht $R'5\' 2"'Lg$  (1.575 m)  Wt 161 lb (73.029 kg)  BMI 29.44 kg/m2 , BMI Body mass index is 29.44 kg/(m^2). GEN: Well nourished, well developed, in no acute distress Neck: no JVD, HJR, carotid bruits, or masses Cardiac: RRR; 2 to 3/6 systolic murmur at the left sternal border, no gallop, rubs, thrill or heave,  Respiratory:  clear to auscultation bilaterally, normal work of breathing GI: soft, nontender, nondistended, + BS MS: no deformity or atrophy Extremities: without cyanosis, clubbing, edema, good distal pulses bilaterally.  Skin: warm and dry, no rash Neuro:  Strength and sensation are intact    EKG:  EKG is ordered today. The ekg ordered today demonstrates normal sinus rhythm with right bundle branch block, unchanged from prior tracing Recent Labs: 09/28/2014: B Natriuretic Peptide 597.6* 09/29/2014: TSH 3.099 10/01/2014: Magnesium 2.2 10/05/2014: ALT 68* 10/10/2014: BUN 62*; Creatinine, Ser 3.64*; Hemoglobin 8.9*; Platelets 95*; Potassium 5.0; Sodium 146*    Lipid Panel No results found for: CHOL, TRIG, HDL, CHOLHDL, VLDL, LDLCALC, LDLDIRECT    Wt Readings from Last 3 Encounters:  11/17/14 161 lb (73.029 kg)  10/09/14 172 lb 3.2  oz (78.109 kg)  06/04/14 169 lb (76.658 kg)      Other studies Reviewed: Additional studies/ records that were reviewed today include and review of the records demonstrates:   NST completed. Normal study with normal perfusion. Low risk test. EF 65%. Patient notified of results.   Study Conclusions  - Left ventricle: The cavity size was normal. There was severe   focal basal and moderate concentric hypertrophy of the left   ventricle. Systolic function was vigorous. The estimated ejection   fraction was in the range of 65% to 70%. Wall motion was normal;   there were no regional wall motion abnormalities. Features are   consistent with a pseudonormal left ventricular filling pattern,   with concomitant abnormal relaxation and increased filling   pressure (grade 2 diastolic dysfunction). Doppler parameters are   consistent with elevated ventricular end-diastolic filling   pressure. - Mitral  valve: Calcified annulus. Moderately thickened, moderately   calcified leaflets . The findings are consistent with moderate   stenosis. There was moderate regurgitation directed centrally. - Left atrium: The atrium was moderately dilated. - Right atrium: The atrium was moderately dilated. - Tricuspid valve: There was moderate-severe regurgitation. - Pulmonary arteries: PA peak pressure: 68 mm Hg (S).  Impressions:  - There is severe basal septal hypertrophy. LVEF is hyperdanamic.   There is chordal systolic anterior motion of the mitral valve.   LVOT gradient is 28 mmHg at rest and 128 mmHg with Valsalva   maneuver.   Findings are consistent with hypertrophic cardiomyopathy. CMR is   recommended for risk stratification.       ASSESSMENT AND PLAN:   [name] Boast, PA-C  11/17/2014 12:55 PM    [facility] [address], [place], [place]  [address] Phone: [phone]; Fax: [phone]

## 2014-11-17 NOTE — Assessment & Plan Note (Signed)
This is a new diagnosis for the patient. She had rapid atrial fib while hospitalized at [facility] with a acute renal failure and on dialysis. Her rates are controlled with low-dose metoprolol. Higher doses cause hypotension. She was also placed on Coumadin. Will set her up for Coumadin clinic in the [facility] with Edrick Oh.

## 2014-11-18 ENCOUNTER — Ambulatory Visit (INDEPENDENT_AMBULATORY_CARE_PROVIDER_SITE_OTHER): Payer: Medicare Other | Admitting: *Deleted

## 2014-11-18 ENCOUNTER — Ambulatory Visit (HOSPITAL_COMMUNITY): Payer: Medicare Other | Admitting: Hematology & Oncology

## 2014-11-18 DIAGNOSIS — I480 Paroxysmal atrial fibrillation: Secondary | ICD-10-CM

## 2014-11-18 DIAGNOSIS — Z5181 Encounter for therapeutic drug level monitoring: Secondary | ICD-10-CM | POA: Diagnosis not present

## 2014-11-18 LAB — POCT INR: INR: 3.3

## 2014-11-21 ENCOUNTER — Telehealth: Payer: Self-pay | Admitting: Cardiovascular Disease

## 2014-11-21 ENCOUNTER — Telehealth: Payer: Self-pay | Admitting: *Deleted

## 2014-11-21 NOTE — Telephone Encounter (Signed)
NEEDS APPROVAL to have SOLUMEDROL given during her blood Transfusion. She is currently at [facility] to have done now. Waiting on instructions before they can start

## 2014-11-21 NOTE — Telephone Encounter (Signed)
Dyann Ruddle from [facility] called the [facility] clinic on [facility] and stated the patient needed packed red blood cells and solu-medrol IV for treatment.  Advised to treat the patient as indicated by the Emergency Physician taking care of the patient.  She states the Physician wanted her to call us because the family member told them they needed to check with the [facility] clinic before treatment.  Advised we want them to treat the patient for any acute issues, she verbalized understanding and stated that they will continue to treat.  Advised to call with any new medication changes or issues.  She states she will call back with any other issues.

## 2014-11-21 NOTE — Telephone Encounter (Signed)
One time dose of solumedrol prior to blood transfusion, pt is on coumadin and family wanting approval prior to transfusion. Coumadin nurse off today and [place] office has deferred to [facility] cardiologist. Will forward to Dr. Bronson Ing

## 2014-11-21 NOTE — Telephone Encounter (Signed)
That would be fine 

## 2014-11-25 ENCOUNTER — Telehealth: Payer: Self-pay | Admitting: *Deleted

## 2014-11-25 NOTE — Telephone Encounter (Signed)
Spoke with pt's son, [name] [name].  Pt was given Rx yesterday 7/18 for Dexamethasone 20mg  1 x week and cipro 500mg  bid x 7 days. Has not started yet. Discussed interactions with son.  Cipro can elevate INR.  Coumadin dose was decreased last week and she has a f/u appt 7/26.  Will keep pt on current coumadin dose till INR check.  Dexamethasone is just 1 x  week with chemo.  She is not scheduled for chemo before next INR check.

## 2014-11-27 ENCOUNTER — Telehealth: Payer: Self-pay | Admitting: *Deleted

## 2014-11-27 NOTE — Telephone Encounter (Signed)
Called stating pt is being started on Lorazepam 0.5mg  prn and epogen injections for low Hgb.  Told pt neither med should interfere with coumadin.

## 2014-12-02 ENCOUNTER — Ambulatory Visit (INDEPENDENT_AMBULATORY_CARE_PROVIDER_SITE_OTHER): Payer: Medicare Other | Admitting: *Deleted

## 2014-12-02 DIAGNOSIS — I480 Paroxysmal atrial fibrillation: Secondary | ICD-10-CM

## 2014-12-02 DIAGNOSIS — Z5181 Encounter for therapeutic drug level monitoring: Secondary | ICD-10-CM | POA: Diagnosis not present

## 2014-12-02 LAB — POCT INR: INR: 4

## 2014-12-09 ENCOUNTER — Ambulatory Visit (INDEPENDENT_AMBULATORY_CARE_PROVIDER_SITE_OTHER): Payer: Medicare Other | Admitting: *Deleted

## 2014-12-09 DIAGNOSIS — I480 Paroxysmal atrial fibrillation: Secondary | ICD-10-CM

## 2014-12-09 DIAGNOSIS — Z5181 Encounter for therapeutic drug level monitoring: Secondary | ICD-10-CM | POA: Diagnosis not present

## 2014-12-09 LAB — POCT INR: INR: 1.6

## 2014-12-18 ENCOUNTER — Ambulatory Visit (INDEPENDENT_AMBULATORY_CARE_PROVIDER_SITE_OTHER): Payer: Medicare Other | Admitting: *Deleted

## 2014-12-18 DIAGNOSIS — I480 Paroxysmal atrial fibrillation: Secondary | ICD-10-CM | POA: Diagnosis not present

## 2014-12-18 DIAGNOSIS — Z5181 Encounter for therapeutic drug level monitoring: Secondary | ICD-10-CM | POA: Diagnosis not present

## 2014-12-18 LAB — POCT INR: INR: 2.7

## 2014-12-26 ENCOUNTER — Ambulatory Visit: Payer: Medicare Other | Admitting: Cardiovascular Disease

## 2014-12-30 ENCOUNTER — Other Ambulatory Visit: Payer: Self-pay | Admitting: *Deleted

## 2014-12-30 DIAGNOSIS — Z01810 Encounter for preprocedural cardiovascular examination: Secondary | ICD-10-CM

## 2014-12-30 DIAGNOSIS — N179 Acute kidney failure, unspecified: Secondary | ICD-10-CM

## 2014-12-31 ENCOUNTER — Ambulatory Visit (INDEPENDENT_AMBULATORY_CARE_PROVIDER_SITE_OTHER): Payer: Medicare Other | Admitting: *Deleted

## 2014-12-31 ENCOUNTER — Encounter: Payer: Self-pay | Admitting: Cardiovascular Disease

## 2014-12-31 ENCOUNTER — Ambulatory Visit (INDEPENDENT_AMBULATORY_CARE_PROVIDER_SITE_OTHER): Payer: Medicare Other | Admitting: Cardiovascular Disease

## 2014-12-31 VITALS — BP 181/76 | HR 86 | Ht 62.0 in | Wt 162.0 lb

## 2014-12-31 DIAGNOSIS — I471 Supraventricular tachycardia: Secondary | ICD-10-CM

## 2014-12-31 DIAGNOSIS — Z9289 Personal history of other medical treatment: Secondary | ICD-10-CM

## 2014-12-31 DIAGNOSIS — I2510 Atherosclerotic heart disease of native coronary artery without angina pectoris: Secondary | ICD-10-CM

## 2014-12-31 DIAGNOSIS — I480 Paroxysmal atrial fibrillation: Secondary | ICD-10-CM | POA: Diagnosis not present

## 2014-12-31 DIAGNOSIS — I421 Obstructive hypertrophic cardiomyopathy: Secondary | ICD-10-CM | POA: Diagnosis not present

## 2014-12-31 DIAGNOSIS — Z5181 Encounter for therapeutic drug level monitoring: Secondary | ICD-10-CM | POA: Diagnosis not present

## 2014-12-31 DIAGNOSIS — C9000 Multiple myeloma not having achieved remission: Secondary | ICD-10-CM

## 2014-12-31 DIAGNOSIS — I10 Essential (primary) hypertension: Secondary | ICD-10-CM

## 2014-12-31 DIAGNOSIS — I5032 Chronic diastolic (congestive) heart failure: Secondary | ICD-10-CM | POA: Diagnosis not present

## 2014-12-31 DIAGNOSIS — Z87898 Personal history of other specified conditions: Secondary | ICD-10-CM

## 2014-12-31 LAB — POCT INR: INR: 2.2

## 2014-12-31 MED ORDER — AMLODIPINE BESYLATE 5 MG PO TABS: 5.0000 mg | ORAL_TABLET | Freq: Every day | ORAL | Status: DC

## 2014-12-31 NOTE — Progress Notes (Signed)
Patient ID: [name] [name], female   DOB: [birth year], 74 y.o.   MRN: [id number]      SUBJECTIVE: The patient is a 74 year old woman who I'm meeting for the first time. She has a complex cardiovascular history which includes hypertrophic cardiomyopathy, chronic diastolic heart failure, hypertension, coronary artery disease, and renal failure and is on hemodialysis area did she also has multiple myeloma and is followed by oncology.  Echocardiogram on 09/29/14 demonstrated vigorous left ventricular systolic function, LVEF 00-71%, grade 2 diastolic dysfunction, moderate mitral stenosis and regurgitation, moderate biatrial dilatation, moderate to severe tricuspid regurgitation, and severely elevated pulmonary pressures, 68 mmHg. LVOT gradient was 28 mmHg at rest and 128 mmHg with Valsalva maneuver.  Nuclear stress testing on 09/30/14 was normal.  It was mentioned in Dr. Elmarie Shiley note on 10/02/14 that the patient should be evaluated by Dr. Burt Knack as an outpatient once renal function improved to assess whether or not she should be a candidate for alcohol septal ablation versus myomectomy if symptoms persisted.  She currently denies chest pain, palpitations, and shortness of breath. Her oncologist is in [place]. She undergoes dialysis at [facility] with Dr. Lowanda Foster in [place] as well three days per week.   She tells me her hemoglobin has dropped and is going to receive packed red blood cell transfusions this upcoming Monday. She is due for a chest x-ray this Friday. Systolic blood pressure yesterday was over 200. She is not certain of her metoprolol dose.   Review of Systems: As per "subjective", otherwise negative.  Allergies  Allergen Reactions  . Morphine Shortness Of Breath    Eyes became bloodshot  . Phenergan [Promethazine Hcl] Other (See Comments)    Restlss, loopy  . Penicillins Rash    Current Outpatient Prescriptions  Medication Sig Dispense Refill  . amiodarone (PACERONE) 200 MG tablet Take  200 mg by mouth daily.     Marland Kitchen aspirin 81 MG EC tablet Take 81 mg by mouth daily.      Marland Kitchen atorvastatin (LIPITOR) 80 MG tablet Take 80 mg by mouth at bedtime.     Marland Kitchen denosumab (PROLIA) 60 MG/ML SOLN injection Inject 60 mg into the skin every 6 (six) months. Administer in upper arm, thigh, or abdomen    . latanoprost (XALATAN) 0.005 % ophthalmic solution Place 1 drop into both eyes at bedtime.    . metoprolol tartrate (LOPRESSOR) 25 MG tablet TAKE 1 TABLET BY MOUTH TWO TIMES A DAY (Patient taking differently: Take 1/2 tablet by mouth nightly) 180 tablet 3  . ondansetron (ZOFRAN) 4 MG tablet Take 4 mg by mouth 2 (two) times daily.     . pantoprazole (PROTONIX) 40 MG tablet Take 1 tablet (40 mg total) by mouth daily. 30 tablet 0  . senna-docusate (SENOKOT-S) 8.6-50 MG per tablet Take 1 tablet by mouth 2 (two) times daily.     Marland Kitchen warfarin (COUMADIN) 4 MG tablet Take by mouth.     No current facility-administered medications for this visit.    Past Medical History  Diagnosis Date  . HTN (hypertension)   . Hyperlipidemia   . Hypertrophic cardiomyopathy     Mild hypertrophic CM without outflow tract obstruction -- ETT/Myoview EF 74%. no ischemia (12/2006)  --stress echo 8/10: normal BP response to exercise. no presyncope  no wall motion abnormalties. No significant LVOT obstruction. non-diagnositc ST depression  --holter monitor normal   . CAD (coronary artery disease)     --s/p DES LAD and POBA diagonal 11/10  --RHC cath normal  .  COPD (chronic obstructive pulmonary disease)   . Obesity   . Pre-syncope   . CHF (congestive heart failure)     No past surgical history on file.  Social History   Social History  . Marital Status: Widowed    Spouse Name: N/A  . Number of Children: N/A  . Years of Education: N/A   Occupational History  . Not on file.   Social History Main Topics  . Smoking status: Former Smoker -- 0.25 packs/day for 32 years    Types: Cigarettes    Start date: 07/05/1968     Quit date: 05/09/2000  . Smokeless tobacco: Never Used  . Alcohol Use: No  . Drug Use: No  . Sexual Activity: Not on file   Other Topics Concern  . Not on file   Social History Narrative     Filed Vitals:   12/31/14 1339  BP: 181/76  Pulse: 86  Height: $Remove'5\' 2"'iiEyPfh$  (1.575 m)  Weight: 162 lb (73.483 kg)    PHYSICAL EXAM General: NAD HEENT: Normal. Neck: No JVD, no thyromegaly. Lungs: Clear to auscultation bilaterally with normal respiratory effort. CV: Regular rate and rhythm, normal S1/S2, no J3/H5, 2/6 pansystolic murmur heard throughout precordium. No pretibial or periankle edema.  No carotid bruit.  Normal pedal pulses.  Abdomen: Soft, nontender, no hepatosplenomegaly, no distention.  Neurologic: Alert and oriented x 3.  Psych: Normal affect. Skin: Normal. Musculoskeletal: Normal range of motion, no gross deformities. Extremities: No clubbing or cyanosis.   ECG: Most recent ECG reviewed.      ASSESSMENT AND PLAN: 1. Hypertrophic cardiomyopathy: Symptomatically stable. Will make certain she is taking metoprolol 25 mg bid (she will call back to confirm dose).   2. CAD with LAD stent in 2010: Symptomatically stable. On ASA, metoprolol, and Lipitor.  3. Chronic diastolic heart failure: Euvolemic with volume management per dialysis. Will add amlodipine for BP control.  4. Paroxysmal atrial fibrillation: On amiodarone and warfarin. High risk for recurrence given mitral stenosis, regurgitation, HCM, and atrial enlargement. Currently in a regular rhythm.  5. Essential HTN: Markedly elevated. Will add amlodipine 5 mg.  6. PSVT: Symptomatically stable. Continue metoprolol 25 mg bid.  Dispo: f/u 3 months.   Time spent: 40 minutes, of which greater than 50% was spent reviewing symptoms, relevant blood tests and studies, and discussing management plan with the patient.  Kate Sable, M.D., F.A.C.C.

## 2014-12-31 NOTE — Patient Instructions (Signed)
   Begin Norvasc 5mg  daily - new sent to pharmacy today.  Please call the office regarding the dose of your Metoprolol (Lopressor). Continue all other medications.   Follow up in  3 months

## 2015-01-01 ENCOUNTER — Telehealth: Payer: Self-pay | Admitting: Cardiovascular Disease

## 2015-01-01 MED ORDER — METOPROLOL TARTRATE 25 MG PO TABS: 12.5000 mg | ORAL_TABLET | Freq: Two times a day (BID) | ORAL | Status: DC

## 2015-01-01 NOTE — Telephone Encounter (Signed)
Looks like pt is not taking metoprolol 25 mg BID,do you want dose increased ?

## 2015-01-01 NOTE — Telephone Encounter (Signed)
Pts vm full on phone.I spoke with son Louie Casa and he states he will instruct mother to take metoprolol 12.5 mg BID

## 2015-01-01 NOTE — Telephone Encounter (Signed)
Increase to 12.5 mg bid to start.

## 2015-01-01 NOTE — Telephone Encounter (Signed)
Patient called back to advise she was taking Metoprolol 25 mg 1/2 tab every night.  Wants to know if this change anything regarding her meds from visit on yesterday / tg

## 2015-01-20 ENCOUNTER — Ambulatory Visit (INDEPENDENT_AMBULATORY_CARE_PROVIDER_SITE_OTHER): Payer: Medicare Other | Admitting: *Deleted

## 2015-01-20 DIAGNOSIS — Z5181 Encounter for therapeutic drug level monitoring: Secondary | ICD-10-CM | POA: Diagnosis not present

## 2015-01-20 DIAGNOSIS — I480 Paroxysmal atrial fibrillation: Secondary | ICD-10-CM

## 2015-01-20 LAB — POCT INR: INR: 2.7

## 2015-01-23 ENCOUNTER — Encounter: Payer: Self-pay | Admitting: Surgery

## 2015-01-26 ENCOUNTER — Ambulatory Visit (INDEPENDENT_AMBULATORY_CARE_PROVIDER_SITE_OTHER)
Admission: RE | Admit: 2015-01-26 | Discharge: 2015-01-26 | Disposition: A | Discharge status: Home / Self Care | Payer: Medicare Other | Source: Ambulatory Visit | Attending: Surgery | Admitting: Surgery

## 2015-01-26 ENCOUNTER — Ambulatory Visit (INDEPENDENT_AMBULATORY_CARE_PROVIDER_SITE_OTHER): Payer: Medicare Other | Admitting: Surgery

## 2015-01-26 ENCOUNTER — Ambulatory Visit (HOSPITAL_COMMUNITY)
Admission: RE | Admit: 2015-01-26 | Discharge: 2015-01-26 | Disposition: A | Discharge status: Home / Self Care | Payer: Medicare Other | Source: Ambulatory Visit | Attending: Surgery | Admitting: Surgery

## 2015-01-26 ENCOUNTER — Encounter: Payer: Self-pay | Admitting: Surgery

## 2015-01-26 VITALS — BP 158/66 | HR 70 | Temp 98.1°F | Resp 16 | Ht 62.0 in | Wt 162.6 lb

## 2015-01-26 DIAGNOSIS — Z992 Dependence on renal dialysis: Secondary | ICD-10-CM | POA: Diagnosis not present

## 2015-01-26 DIAGNOSIS — I2510 Atherosclerotic heart disease of native coronary artery without angina pectoris: Secondary | ICD-10-CM | POA: Diagnosis not present

## 2015-01-26 DIAGNOSIS — Z01810 Encounter for preprocedural cardiovascular examination: Secondary | ICD-10-CM

## 2015-01-26 DIAGNOSIS — I10 Essential (primary) hypertension: Secondary | ICD-10-CM | POA: Diagnosis not present

## 2015-01-26 DIAGNOSIS — E785 Hyperlipidemia, unspecified: Secondary | ICD-10-CM | POA: Insufficient documentation

## 2015-01-26 DIAGNOSIS — N179 Acute kidney failure, unspecified: Secondary | ICD-10-CM | POA: Diagnosis not present

## 2015-01-26 DIAGNOSIS — N186 End stage renal disease: Secondary | ICD-10-CM | POA: Diagnosis not present

## 2015-01-26 NOTE — Progress Notes (Signed)
[facility] New Access  Referred by:  Neale Burly, MD [address] [place], [place] [address]  Reason for referral: New access  History of Present Illness  [name] [name] is a 74 y.o. (Nov 22, 1940) female who presents for evaluation for permanent access. She is currently dialyzing on Tuesdays, Thursdays and Fridays via a right IJ tunneled dialysis catheter which was placed in June 2016.  The patient is right hand dominant.  The patient has not had previous access procedures.  The patient does not have a pacemaker.   She has a past medical history of multiple myeloma currently undergoing chemotheraphy, paroxysmal atrial fibrillation on warfarin, hypertrophic cardiomyopathy, chronic diastolic heart failure, coronary artery disease s/p DES to LAD (2010) and hypertension managed on amlodipine. Her cardiologist is Dr. Burke Keels.   Past Medical History  Diagnosis Date  . HTN (hypertension)   . Hyperlipidemia   . Hypertrophic cardiomyopathy     Mild hypertrophic CM without outflow tract obstruction -- ETT/Myoview EF 74%. no ischemia (12/2006)  --stress echo 8/10: normal BP response to exercise. no presyncope  no wall motion abnormalties. No significant LVOT obstruction. non-diagnositc ST depression  --holter monitor normal   . CAD (coronary artery disease)     --s/p DES LAD and POBA diagonal 11/10  --RHC cath normal  . COPD (chronic obstructive pulmonary disease)   . Obesity   . Pre-syncope   . CHF (congestive heart failure)     History reviewed. No pertinent past surgical history.  Social History   Social History  . Marital Status: Widowed    Spouse Name: N/A  . Number of Children: N/A  . Years of Education: N/A   Occupational History  . Not on file.   Social History Main Topics  . Smoking status: Former Smoker -- 0.25 packs/day for 32 years    Types: Cigarettes    Start date: 07/05/1968    Quit date: 05/09/2000  . Smokeless tobacco: Never Used  .  Alcohol Use: No  . Drug Use: No  . Sexual Activity: Not on file   Other Topics Concern  . Not on file   Social History Narrative    Family History  Problem Relation Age of Onset  . CAD Mother   . Multiple myeloma Father   . Diabetes type II Sister   . CAD Brother   . Stroke Sister   . Uterine cancer Sister     Current Outpatient Prescriptions on File Prior to Visit  Medication Sig Dispense Refill  . amiodarone (PACERONE) 200 MG tablet Take 200 mg by mouth daily.     Marland Kitchen amLODipine (NORVASC) 5 MG tablet Take 1 tablet (5 mg total) by mouth daily. 30 tablet 6  . aspirin 81 MG EC tablet Take 81 mg by mouth daily.      Marland Kitchen atorvastatin (LIPITOR) 80 MG tablet Take 80 mg by mouth at bedtime.     Marland Kitchen denosumab (PROLIA) 60 MG/ML SOLN injection Inject 60 mg into the skin every 6 (six) months. Administer in upper arm, thigh, or abdomen    . latanoprost (XALATAN) 0.005 % ophthalmic solution Place 1 drop into both eyes at bedtime.    . metoprolol tartrate (LOPRESSOR) 25 MG tablet Take 0.5 tablets (12.5 mg total) by mouth 2 (two) times daily. 90 tablet 3  . ondansetron (ZOFRAN) 4 MG tablet Take 4 mg by mouth 2 (two) times daily.     . pantoprazole (PROTONIX) 40 MG tablet Take 1 tablet (40  mg total) by mouth daily. 30 tablet 0  . senna-docusate (SENOKOT-S) 8.6-50 MG per tablet Take 1 tablet by mouth 2 (two) times daily.     Marland Kitchen warfarin (COUMADIN) 4 MG tablet Take by mouth.     No current facility-administered medications on file prior to visit.    Allergies  Allergen Reactions  . Morphine Shortness Of Breath    Eyes became bloodshot  . Phenergan [Promethazine Hcl] Other (See Comments)    Restlss, loopy  . Penicillins Rash    REVIEW OF SYSTEMS:  (Positives checked otherwise negative)  CARDIOVASCULAR:  []  chest pain, []  chest pressure, []  palpitations, []  shortness of breath when laying flat, [x]  shortness of breath with exertion,  [x]  pain in legs when walking, []  pain in feet when laying  flat, []  history of blood clot in veins (DVT), []  history of phlebitis, []  swelling in legs, []  varicose veins  PULMONARY:  []  productive cough, []  asthma, []  wheezing  NEUROLOGIC:  [x]  weakness in legs, []  numbness in arms or legs, []  difficulty speaking or slurred speech, []  temporary loss of vision in one eye, []  dizziness  HEMATOLOGIC:  []  bleeding problems, []  problems with blood clotting too easily  MUSCULOSKEL:  []  joint pain, []  joint swelling  GASTROINTEST:  []  vomiting blood, []  blood in stool     GENITOURINARY:  []  burning with urination, []  blood in urine  PSYCHIATRIC:  []  history of major depression  INTEGUMENTARY:  []  rashes, []  ulcers  CONSTITUTIONAL:  []  fever, []  chills  Physical Examination  Filed Vitals:   01/26/15 1505 01/26/15 1510  BP: 165/71 158/66  Pulse: 70   Temp: 98.1 F (36.7 C)   TempSrc: Oral   Resp: 16   Height: 5\' 2"  (1.575 m)   Weight: 162 lb 9.6 oz (73.755 kg)   SpO2: 96%    Body mass index is 29.73 kg/(m^2).  General: A&O x 3, appears stated age in NAD  Head: [place]/AT  Neck: Supple  Pulmonary: Sym exp, good air movt, CTAB, no rales, rhonchi, & wheezing  Cardiac: RRR, Nl S1, S2, no Murmurs, rubs or gallops  Vascular: No carotid bruits, 2+ radial and ulnar pulses b/l  Musculoskeletal: No muscle wasting or atrophy  Neurologic: No focal deficits  Psychiatric: Judgment intact, Mood & affect appropriate for pt's clinical situation  Dermatologic: See M/S exam for extremity exam, no rashes otherwise noted  Non-Invasive Vascular Imaging  Vein Mapping  (Date: 01/26/2015):   R arm: acceptable vein conduits include cephalic   L arm: acceptable vein conduits include cephalic and basilic  BUE Doppler (Date: 01/26/2015):   R arm:   Brachial: triphasic  Radial: triphasic  Ulnar: triphasic  L arm:   Brachial: triphasic  Radial: triphasic  Ulnar: triphasic  Outside Studies/Documentation 4 pages of outside documents were  reviewed including: medical records from PCP.  Medical Decision Making  [name] [name] is a 74 y.o. female who presents with ESRD requiring hemodialysis. The patient is right-handed.   Based on vein mapping and examination, this patient's permanent access options include: left radial-cephalic versus brachial-cephalic AV fistula.   I had an extensive discussion with this patient in regards to the nature of access surgery, including risk, benefits, and alternatives.   The patient is aware that the risks of access surgery include but are not limited to: bleeding, infection, steal syndrome, nerve damage,failure of access to mature, and possible need for additional access procedures in the future.  The patient has agreed  to proceed with the above procedure which will be scheduled 02/06/15 with Dr. Trula Slade.  Currently on warfarin for paroxysmal atrial fibrillation. Will need to discontinue warfarin 5 days prior to surgery. Will discuss with cardiologist Dr. Bronson Ing regarding lovenox bridging prior to surgery.   Virgina Jock, PA-C [facility] [place] Office: [phone] Pager: [phone]  01/26/2015, 4:16 PM  This patient was seen in conjunction with Dr. Trula Slade   I agree with the above.  I have seen and evaluated the patient.  This is a right-handed female with end-stage renal disease who is currently receiving dialysis through a right-sided catheter.  She is here today for permanent access.  I have reviewed her vein mapping which shows an excellent left cephalic vein.  She has a palpable left radial pulse.  I recommended proceeding with a left radiocephalic fistula, possibly brachiocephalic if the vein diameter is not appropriate at the wrist.  The patient is on Coumadin.  I will speak with cardiology regarding holding her Coumadin before surgery and whether or not she needs a Lovenox bridge.  I have scheduled her operation which will be a left radiocephalic  fistula on Friday, September 30 is [place] and benefits of the procedure including the risk of steal, the risk of non-maturity, and the need for future interventions.  Annamarie Major

## 2015-01-27 ENCOUNTER — Telehealth: Payer: Self-pay | Admitting: Pharmacist

## 2015-01-27 ENCOUNTER — Other Ambulatory Visit: Payer: Self-pay

## 2015-01-27 NOTE — Telephone Encounter (Signed)
Pt has a CHADS score of 3.  No history of stroke/TIA.  Okay to hold Coumadin x 5 days prior to fistula placement.

## 2015-01-27 NOTE — Telephone Encounter (Signed)
Forwarded to Dr Stephens Shire office.

## 2015-01-27 NOTE — Telephone Encounter (Signed)
-----   Message from Malen Gauze, RN sent at 01/27/2015 12:00 PM EDT ----- Can pt hold coumadin 5 days for fistula placement?

## 2015-02-05 ENCOUNTER — Encounter (HOSPITAL_COMMUNITY): Payer: Self-pay | Admitting: *Deleted

## 2015-02-05 MED ORDER — VANCOMYCIN HCL IN DEXTROSE 1-5 GM/200ML-% IV SOLN
1000.0000 mg | INTRAVENOUS | Status: AC
  Administered 2015-02-06: 1000 mg via INTRAVENOUS
  Filled 2015-02-05: qty 200

## 2015-02-05 MED ORDER — CHLORHEXIDINE GLUCONATE CLOTH 2 % EX PADS: 6.0000 | MEDICATED_PAD | Freq: Once | CUTANEOUS | Status: DC

## 2015-02-05 MED ORDER — SODIUM CHLORIDE 0.9 % IV SOLN
INTRAVENOUS | Status: DC
  Administered 2015-02-06 (×2): via INTRAVENOUS

## 2015-02-05 NOTE — Progress Notes (Signed)
Hx of 1 cardiac stent in 2010, denies any recent chest pain or sob.

## 2015-02-06 ENCOUNTER — Ambulatory Visit (HOSPITAL_COMMUNITY): Payer: Medicare Other | Admitting: Certified Registered Nurse Anesthetist

## 2015-02-06 ENCOUNTER — Other Ambulatory Visit: Payer: Self-pay | Admitting: *Deleted

## 2015-02-06 ENCOUNTER — Encounter (HOSPITAL_COMMUNITY): Payer: Self-pay | Admitting: *Deleted

## 2015-02-06 ENCOUNTER — Ambulatory Visit (HOSPITAL_COMMUNITY)
Admission: RE | Admit: 2015-02-06 | Discharge: 2015-02-06 | Disposition: A | Discharge status: Home / Self Care | Payer: Medicare Other | Source: Ambulatory Visit | Attending: Surgery | Admitting: Surgery

## 2015-02-06 ENCOUNTER — Encounter (HOSPITAL_COMMUNITY)
Admission: RE | Disposition: A | Discharge status: Home / Self Care | Payer: Self-pay | Source: Ambulatory Visit | Attending: Surgery

## 2015-02-06 DIAGNOSIS — Z79899 Other long term (current) drug therapy: Secondary | ICD-10-CM | POA: Insufficient documentation

## 2015-02-06 DIAGNOSIS — I5032 Chronic diastolic (congestive) heart failure: Secondary | ICD-10-CM | POA: Insufficient documentation

## 2015-02-06 DIAGNOSIS — Z992 Dependence on renal dialysis: Secondary | ICD-10-CM | POA: Insufficient documentation

## 2015-02-06 DIAGNOSIS — J449 Chronic obstructive pulmonary disease, unspecified: Secondary | ICD-10-CM | POA: Insufficient documentation

## 2015-02-06 DIAGNOSIS — I2510 Atherosclerotic heart disease of native coronary artery without angina pectoris: Secondary | ICD-10-CM | POA: Insufficient documentation

## 2015-02-06 DIAGNOSIS — Z7982 Long term (current) use of aspirin: Secondary | ICD-10-CM | POA: Insufficient documentation

## 2015-02-06 DIAGNOSIS — D649 Anemia, unspecified: Secondary | ICD-10-CM | POA: Diagnosis not present

## 2015-02-06 DIAGNOSIS — Z7901 Long term (current) use of anticoagulants: Secondary | ICD-10-CM | POA: Insufficient documentation

## 2015-02-06 DIAGNOSIS — I422 Other hypertrophic cardiomyopathy: Secondary | ICD-10-CM | POA: Insufficient documentation

## 2015-02-06 DIAGNOSIS — Z6829 Body mass index (BMI) 29.0-29.9, adult: Secondary | ICD-10-CM | POA: Diagnosis not present

## 2015-02-06 DIAGNOSIS — C9000 Multiple myeloma not having achieved remission: Secondary | ICD-10-CM | POA: Diagnosis not present

## 2015-02-06 DIAGNOSIS — N186 End stage renal disease: Secondary | ICD-10-CM | POA: Insufficient documentation

## 2015-02-06 DIAGNOSIS — Z4931 Encounter for adequacy testing for hemodialysis: Secondary | ICD-10-CM

## 2015-02-06 DIAGNOSIS — Z87891 Personal history of nicotine dependence: Secondary | ICD-10-CM | POA: Insufficient documentation

## 2015-02-06 DIAGNOSIS — Z7983 Long term (current) use of bisphosphonates: Secondary | ICD-10-CM | POA: Insufficient documentation

## 2015-02-06 DIAGNOSIS — E785 Hyperlipidemia, unspecified: Secondary | ICD-10-CM | POA: Insufficient documentation

## 2015-02-06 DIAGNOSIS — I480 Paroxysmal atrial fibrillation: Secondary | ICD-10-CM | POA: Insufficient documentation

## 2015-02-06 DIAGNOSIS — I120 Hypertensive chronic kidney disease with stage 5 chronic kidney disease or end stage renal disease: Secondary | ICD-10-CM | POA: Insufficient documentation

## 2015-02-06 DIAGNOSIS — Z955 Presence of coronary angioplasty implant and graft: Secondary | ICD-10-CM | POA: Diagnosis not present

## 2015-02-06 DIAGNOSIS — E669 Obesity, unspecified: Secondary | ICD-10-CM | POA: Diagnosis not present

## 2015-02-06 DIAGNOSIS — N185 Chronic kidney disease, stage 5: Secondary | ICD-10-CM | POA: Diagnosis not present

## 2015-02-06 HISTORY — PX: AV FISTULA PLACEMENT: SHX1204

## 2015-02-06 HISTORY — DX: Unspecified atrial fibrillation: I48.91

## 2015-02-06 HISTORY — DX: Pneumonia, unspecified organism: J18.9

## 2015-02-06 HISTORY — DX: Major depressive disorder, single episode, unspecified: F32.9

## 2015-02-06 HISTORY — DX: Anemia, unspecified: D64.9

## 2015-02-06 HISTORY — DX: Dependence on renal dialysis: Z99.2

## 2015-02-06 HISTORY — DX: Other specified postprocedural states: R11.2

## 2015-02-06 HISTORY — DX: Other specified postprocedural states: Z98.890

## 2015-02-06 HISTORY — DX: Malignant (primary) neoplasm, unspecified: C80.1

## 2015-02-06 HISTORY — DX: Depression, unspecified: F32.A

## 2015-02-06 HISTORY — DX: End stage renal disease: N18.6

## 2015-02-06 LAB — POCT I-STAT 4, (NA,K, GLUC, HGB,HCT)
GLUCOSE: 83 mg/dL (ref 65–99)
HCT: 32 % — ABNORMAL LOW (ref 36.0–46.0)
HEMOGLOBIN: 10.9 g/dL — AB (ref 12.0–15.0)
POTASSIUM: 4.4 mmol/L (ref 3.5–5.1)
SODIUM: 138 mmol/L (ref 135–145)

## 2015-02-06 LAB — PROTIME-INR
INR: 1.18 (ref 0.00–1.49)
Prothrombin Time: 15.1 seconds (ref 11.6–15.2)

## 2015-02-06 LAB — APTT: aPTT: 27 seconds (ref 24–37)

## 2015-02-06 SURGERY — ARTERIOVENOUS (AV) FISTULA CREATION
Anesthesia: Monitor Anesthesia Care | Site: Arm Lower | Laterality: Left

## 2015-02-06 MED ORDER — PROPOFOL 10 MG/ML IV BOLUS
INTRAVENOUS | Status: AC
  Filled 2015-02-06: qty 20

## 2015-02-06 MED ORDER — FENTANYL CITRATE (PF) 250 MCG/5ML IJ SOLN
INTRAMUSCULAR | Status: AC
  Filled 2015-02-06: qty 5

## 2015-02-06 MED ORDER — ONDANSETRON HCL 4 MG/2ML IJ SOLN
INTRAMUSCULAR | Status: DC | PRN
  Administered 2015-02-06: 4 mg via INTRAVENOUS

## 2015-02-06 MED ORDER — PROTAMINE SULFATE 10 MG/ML IV SOLN
INTRAVENOUS | Status: DC | PRN
  Administered 2015-02-06: 25 mg via INTRAVENOUS

## 2015-02-06 MED ORDER — 0.9 % SODIUM CHLORIDE (POUR BTL) OPTIME
TOPICAL | Status: DC | PRN
  Administered 2015-02-06: 1000 mL

## 2015-02-06 MED ORDER — LIDOCAINE-EPINEPHRINE (PF) 1 %-1:200000 IJ SOLN
INTRAMUSCULAR | Status: AC
  Filled 2015-02-06: qty 30

## 2015-02-06 MED ORDER — HEPARIN SODIUM (PORCINE) 1000 UNIT/ML IJ SOLN
INTRAMUSCULAR | Status: DC | PRN
  Administered 2015-02-06: 3000 [IU] via INTRAVENOUS

## 2015-02-06 MED ORDER — FENTANYL CITRATE (PF) 100 MCG/2ML IJ SOLN
INTRAMUSCULAR | Status: DC | PRN
  Administered 2015-02-06: 50 ug via INTRAVENOUS
  Administered 2015-02-06: 25 ug via INTRAVENOUS

## 2015-02-06 MED ORDER — PROPOFOL 500 MG/50ML IV EMUL
INTRAVENOUS | Status: DC | PRN
  Administered 2015-02-06: 100 ug/kg/min via INTRAVENOUS

## 2015-02-06 MED ORDER — LIDOCAINE-EPINEPHRINE (PF) 1 %-1:200000 IJ SOLN
INTRAMUSCULAR | Status: DC | PRN
  Administered 2015-02-06: 10 mL

## 2015-02-06 MED ORDER — TRAMADOL HCL 50 MG PO TABS: 50.0000 mg | ORAL_TABLET | Freq: Four times a day (QID) | ORAL | Status: AC | PRN

## 2015-02-06 MED ORDER — SODIUM CHLORIDE 0.9 % IV SOLN
INTRAVENOUS | Status: DC | PRN
  Administered 2015-02-06: 500 mL

## 2015-02-06 SURGICAL SUPPLY — 33 items
ARMBAND PINK RESTRICT EXTREMIT (MISCELLANEOUS) ×3 IMPLANT
CANISTER SUCTION 2500CC (MISCELLANEOUS) ×3 IMPLANT
CLIP TI MEDIUM 6 (CLIP) ×3 IMPLANT
CLIP TI WIDE RED SMALL 6 (CLIP) ×3 IMPLANT
COVER PROBE W GEL 5X96 (DRAPES) ×3 IMPLANT
ELECT REM PT RETURN 9FT ADLT (ELECTROSURGICAL) ×3
ELECTRODE REM PT RTRN 9FT ADLT (ELECTROSURGICAL) ×1 IMPLANT
GLOVE BIO SURGEON STRL SZ 6.5 (GLOVE) ×1 IMPLANT
GLOVE BIO SURGEONS STRL SZ 6.5 (GLOVE) ×1
GLOVE BIOGEL PI IND STRL 6.5 (GLOVE) IMPLANT
GLOVE BIOGEL PI IND STRL 7.0 (GLOVE) IMPLANT
GLOVE BIOGEL PI IND STRL 7.5 (GLOVE) ×1 IMPLANT
GLOVE BIOGEL PI INDICATOR 6.5 (GLOVE) ×4
GLOVE BIOGEL PI INDICATOR 7.0 (GLOVE) ×2
GLOVE BIOGEL PI INDICATOR 7.5 (GLOVE) ×2
GLOVE SURG SS PI 7.5 STRL IVOR (GLOVE) ×3 IMPLANT
GOWN STRL REUS W/ TWL LRG LVL3 (GOWN DISPOSABLE) ×2 IMPLANT
GOWN STRL REUS W/ TWL XL LVL3 (GOWN DISPOSABLE) ×1 IMPLANT
GOWN STRL REUS W/TWL LRG LVL3 (GOWN DISPOSABLE) ×6
GOWN STRL REUS W/TWL XL LVL3 (GOWN DISPOSABLE) ×3
HEMOSTAT SNOW SURGICEL 2X4 (HEMOSTASIS) IMPLANT
KIT BASIN OR (CUSTOM PROCEDURE TRAY) ×3 IMPLANT
KIT ROOM TURNOVER OR (KITS) ×3 IMPLANT
LIQUID BAND (GAUZE/BANDAGES/DRESSINGS) ×3 IMPLANT
NS IRRIG 1000ML POUR BTL (IV SOLUTION) ×3 IMPLANT
PACK CV ACCESS (CUSTOM PROCEDURE TRAY) ×3 IMPLANT
PAD ARMBOARD 7.5X6 YLW CONV (MISCELLANEOUS) ×6 IMPLANT
SUT PROLENE 6 0 CC (SUTURE) ×3 IMPLANT
SUT VIC AB 3-0 SH 27 (SUTURE) ×3
SUT VIC AB 3-0 SH 27X BRD (SUTURE) ×1 IMPLANT
SUT VICRYL 4-0 PS2 18IN ABS (SUTURE) ×2 IMPLANT
UNDERPAD 30X30 INCONTINENT (UNDERPADS AND DIAPERS) ×3 IMPLANT
WATER STERILE IRR 1000ML POUR (IV SOLUTION) ×3 IMPLANT

## 2015-02-06 NOTE — Interval H&P Note (Signed)
History and Physical Interval Note:  02/06/2015 10:11 AM  [name] [name]  has presented today for surgery, with the diagnosis of End Stage Renal Disease N18.6  The various methods of treatment have been discussed with the patient and family. After consideration of risks, benefits and other options for treatment, the patient has consented to  Procedure(s): ARTERIOVENOUS (AV) FISTULA CREATION (Left) as a surgical intervention .  The patient's history has been reviewed, patient examined, no change in status, stable for surgery.  I have reviewed the patient's chart and labs.  Questions were answered to the patient's satisfaction.     Annamarie Major

## 2015-02-06 NOTE — Transfer of Care (Signed)
Immediate Anesthesia Transfer of Care Note  Patient: [name] [name]  Procedure(s) Performed: Procedure(s): CREATION OF LEFT ARM  ARTERIOVENOUS (AV) FISTULA  (Left)  Patient Location: PACU  Anesthesia Type:MAC  Level of Consciousness: awake, alert  and oriented  Airway & Oxygen Therapy: Patient Spontanous Breathing and Patient connected to face mask oxygen  Post-op Assessment: Report given to RN and Post -op Vital signs reviewed and stable  Post vital signs: Reviewed and stable  Last Vitals:  Filed Vitals:   02/06/15 0817  BP: 161/74  Pulse: 73  Temp: 36.7 C  Resp: 20    Complications: No apparent anesthesia complications

## 2015-02-06 NOTE — Anesthesia Preprocedure Evaluation (Addendum)
Anesthesia Evaluation  Patient identified by MRN, date of birth, ID band Patient awake    Reviewed: Allergy & Precautions, NPO status , Patient's Chart, lab work & pertinent test results, reviewed documented beta blocker date and time   History of Anesthesia Complications (+) PONV and history of anesthetic complications  Airway Mallampati: III  TM Distance: >3 FB Neck ROM: Full    Dental  (+) Dental Advisory Given, Partial Upper, Partial Lower   Pulmonary former smoker,    Pulmonary exam normal breath sounds clear to auscultation       Cardiovascular hypertension, Pt. on medications and Pt. on home beta blockers (-) angina+ CAD, + Cardiac Stents and +CHF  Normal cardiovascular exam+ dysrhythmias Atrial Fibrillation  Rhythm:Regular Rate:Normal     Neuro/Psych PSYCHIATRIC DISORDERS Depression negative neurological ROS     GI/Hepatic negative GI ROS, Neg liver ROS,   Endo/Other  negative endocrine ROS  Renal/GU ESRF and DialysisRenal disease (dialysis yesterday 02/05/15)     Musculoskeletal negative musculoskeletal ROS (+)   Abdominal   Peds  Hematology  (+) Blood dyscrasia, anemia ,   Anesthesia Other Findings Day of surgery medications reviewed with the patient.  Reproductive/Obstetrics                            Anesthesia Physical Anesthesia Plan  ASA: III  Anesthesia Plan: MAC   Post-op Pain Management:    Induction: Intravenous  Airway Management Planned: Nasal Cannula  Additional Equipment:   Intra-op Plan:   Post-operative Plan:   Informed Consent: I have reviewed the patients History and Physical, chart, labs and discussed the procedure including the risks, benefits and alternatives for the proposed anesthesia with the patient or authorized representative who has indicated his/her understanding and acceptance.   Dental advisory given  Plan Discussed with: CRNA and  Anesthesiologist  Anesthesia Plan Comments: (Discussed risks/benefits/alternatives to MAC sedation including need for ventilatory support, hypotension, need for conversion to general anesthesia.  All patient questions answered.  Patient wished to proceed.)        Anesthesia Quick Evaluation

## 2015-02-06 NOTE — Op Note (Signed)
    Patient name: LANASIA PORRAS MRN: [id number] DOB: 26-Nov-1940 Sex: female  02/06/2015 Pre-operative Diagnosis: End stage renal disease Post-operative diagnosis:  Same Surgeon:  Annamarie Major Assistants:  Silva Bandy Procedure:   Left radiocephalic fistula Anesthesia:  Mac Blood Loss:  See anesthesia record Specimens:  None  Findings:  3 mm vein, 2 mm artery  Indications:  Patient is here for dialysis access.  She currently has a catheter in place  Procedure:  The patient was identified in the holding area and taken to [place] 16  The patient was then placed supine on the table. MAC anesthesia was administered.  The patient was prepped and draped in the usual sterile fashion.  A time out was called and antibiotics were administered.  Ultrasound was used to evaluate the left cephalic vein.  It was adequate size.  One percent lidocaine was used for local anesthesia.  A longitudinal incision was made just proximal to the wrist.  The cephalic vein was dissected free.  Side branches were ligated between silk ties and metal clips.  The vein measured approximately 2. 5-3 millimeters.  Next, dissect out the radial artery.  This was   a disease-free artery but measured approximately 2 mm.  3000 units of heparin was then given.  The vein was marked with an ink pen for orientation.  It was ligated distally with a 2-0 silk tie.  The radial artery was occluded with Serafin clamps.  A #11 blade was used to make an arteriotomy which was extended longitudinally with Potts scissors.  The vein was cut the appropriate length and spatulated to fit the size of the arteriotomy.  A running anastomosis was created with 6-0 Prolene.  Prior to completion, the appropriate flushing maneuvers were performed the anastomosis was completed.  I inspected the course of the vein to make sure there were no kinks.  There was a biphasic radial artery signal distal to the fistula.  There is a good thrill within the fistula to the  antecubital crease.  25 of the protamine was given.  The incision was then closed in 2 layers of 30 Vicryls followed Dermabond.  Disposition:  To PACU in stable condition.   Theotis Burrow, M.D. [facility] Office: [phone] Pager:  [phone]

## 2015-02-06 NOTE — H&P (View-Only) (Signed)
[facility] New Access  Referred by:  Neale Burly, MD [address] [place], [place] [address]  Reason for referral: New access  History of Present Illness  [name] [name] is a 74 y.o. (1940-06-23) female who presents for evaluation for permanent access. She is currently dialyzing on Tuesdays, Thursdays and Fridays via a right IJ tunneled dialysis catheter which was placed in June 2016.  The patient is right hand dominant.  The patient has not had previous access procedures.  The patient does not have a pacemaker.   She has a past medical history of multiple myeloma currently undergoing chemotheraphy, paroxysmal atrial fibrillation on warfarin, hypertrophic cardiomyopathy, chronic diastolic heart failure, coronary artery disease s/p DES to LAD (2010) and hypertension managed on amlodipine. Her cardiologist is Dr. Burke Keels.   Past Medical History  Diagnosis Date  . HTN (hypertension)   . Hyperlipidemia   . Hypertrophic cardiomyopathy     Mild hypertrophic CM without outflow tract obstruction -- ETT/Myoview EF 74%. no ischemia (12/2006)  --stress echo 8/10: normal BP response to exercise. no presyncope  no wall motion abnormalties. No significant LVOT obstruction. non-diagnositc ST depression  --holter monitor normal   . CAD (coronary artery disease)     --s/p DES LAD and POBA diagonal 11/10  --RHC cath normal  . COPD (chronic obstructive pulmonary disease)   . Obesity   . Pre-syncope   . CHF (congestive heart failure)     History reviewed. No pertinent past surgical history.  Social History   Social History  . Marital Status: Widowed    Spouse Name: N/A  . Number of Children: N/A  . Years of Education: N/A   Occupational History  . Not on file.   Social History Main Topics  . Smoking status: Former Smoker -- 0.25 packs/day for 32 years    Types: Cigarettes    Start date: 07/05/1968    Quit date: 05/09/2000  . Smokeless tobacco: Never Used  .  Alcohol Use: No  . Drug Use: No  . Sexual Activity: Not on file   Other Topics Concern  . Not on file   Social History Narrative    Family History  Problem Relation Age of Onset  . CAD Mother   . Multiple myeloma Father   . Diabetes type II Sister   . CAD Brother   . Stroke Sister   . Uterine cancer Sister     Current Outpatient Prescriptions on File Prior to Visit  Medication Sig Dispense Refill  . amiodarone (PACERONE) 200 MG tablet Take 200 mg by mouth daily.     Marland Kitchen amLODipine (NORVASC) 5 MG tablet Take 1 tablet (5 mg total) by mouth daily. 30 tablet 6  . aspirin 81 MG EC tablet Take 81 mg by mouth daily.      Marland Kitchen atorvastatin (LIPITOR) 80 MG tablet Take 80 mg by mouth at bedtime.     Marland Kitchen denosumab (PROLIA) 60 MG/ML SOLN injection Inject 60 mg into the skin every 6 (six) months. Administer in upper arm, thigh, or abdomen    . latanoprost (XALATAN) 0.005 % ophthalmic solution Place 1 drop into both eyes at bedtime.    . metoprolol tartrate (LOPRESSOR) 25 MG tablet Take 0.5 tablets (12.5 mg total) by mouth 2 (two) times daily. 90 tablet 3  . ondansetron (ZOFRAN) 4 MG tablet Take 4 mg by mouth 2 (two) times daily.     . pantoprazole (PROTONIX) 40 MG tablet Take 1 tablet (40  mg total) by mouth daily. 30 tablet 0  . senna-docusate (SENOKOT-S) 8.6-50 MG per tablet Take 1 tablet by mouth 2 (two) times daily.     Marland Kitchen warfarin (COUMADIN) 4 MG tablet Take by mouth.     No current facility-administered medications on file prior to visit.    Allergies  Allergen Reactions  . Morphine Shortness Of Breath    Eyes became bloodshot  . Phenergan [Promethazine Hcl] Other (See Comments)    Restlss, loopy  . Penicillins Rash    REVIEW OF SYSTEMS:  (Positives checked otherwise negative)  CARDIOVASCULAR:  []  chest pain, []  chest pressure, []  palpitations, []  shortness of breath when laying flat, [x]  shortness of breath with exertion,  [x]  pain in legs when walking, []  pain in feet when laying  flat, []  history of blood clot in veins (DVT), []  history of phlebitis, []  swelling in legs, []  varicose veins  PULMONARY:  []  productive cough, []  asthma, []  wheezing  NEUROLOGIC:  [x]  weakness in legs, []  numbness in arms or legs, []  difficulty speaking or slurred speech, []  temporary loss of vision in one eye, []  dizziness  HEMATOLOGIC:  []  bleeding problems, []  problems with blood clotting too easily  MUSCULOSKEL:  []  joint pain, []  joint swelling  GASTROINTEST:  []  vomiting blood, []  blood in stool     GENITOURINARY:  []  burning with urination, []  blood in urine  PSYCHIATRIC:  []  history of major depression  INTEGUMENTARY:  []  rashes, []  ulcers  CONSTITUTIONAL:  []  fever, []  chills  Physical Examination  Filed Vitals:   01/26/15 1505 01/26/15 1510  BP: 165/71 158/66  Pulse: 70   Temp: 98.1 F (36.7 C)   TempSrc: Oral   Resp: 16   Height: 5\' 2"  (1.575 m)   Weight: 162 lb 9.6 oz (73.755 kg)   SpO2: 96%    Body mass index is 29.73 kg/(m^2).  General: A&O x 3, appears stated age in NAD  Head: [place]/AT  Neck: Supple  Pulmonary: Sym exp, good air movt, CTAB, no rales, rhonchi, & wheezing  Cardiac: RRR, Nl S1, S2, no Murmurs, rubs or gallops  Vascular: No carotid bruits, 2+ radial and ulnar pulses b/l  Musculoskeletal: No muscle wasting or atrophy  Neurologic: No focal deficits  Psychiatric: Judgment intact, Mood & affect appropriate for pt's clinical situation  Dermatologic: See M/S exam for extremity exam, no rashes otherwise noted  Non-Invasive Vascular Imaging  Vein Mapping  (Date: 01/26/2015):   R arm: acceptable vein conduits include cephalic   L arm: acceptable vein conduits include cephalic and basilic  BUE Doppler (Date: 01/26/2015):   R arm:   Brachial: triphasic  Radial: triphasic  Ulnar: triphasic  L arm:   Brachial: triphasic  Radial: triphasic  Ulnar: triphasic  Outside Studies/Documentation 4 pages of outside documents were  reviewed including: medical records from PCP.  Medical Decision Making  [name] [name] is a 74 y.o. female who presents with ESRD requiring hemodialysis. The patient is right-handed.   Based on vein mapping and examination, this patient's permanent access options include: left radial-cephalic versus brachial-cephalic AV fistula.   I had an extensive discussion with this patient in regards to the nature of access surgery, including risk, benefits, and alternatives.   The patient is aware that the risks of access surgery include but are not limited to: bleeding, infection, steal syndrome, nerve damage,failure of access to mature, and possible need for additional access procedures in the future.  The patient has agreed  to proceed with the above procedure which will be scheduled 02/06/15 with Dr. Trula Slade.  Currently on warfarin for paroxysmal atrial fibrillation. Will need to discontinue warfarin 5 days prior to surgery. Will discuss with cardiologist Dr. Bronson Ing regarding lovenox bridging prior to surgery.   [name] Jock, PA-C [facility] Office: [phone] Pager: [phone]  01/26/2015, 4:16 PM  This patient was seen in conjunction with Dr. Trula Slade   I agree with the above.  I have seen and evaluated the patient.  This is a right-handed female with end-stage renal disease who is currently receiving dialysis through a right-sided catheter.  She is here today for permanent access.  I have reviewed her vein mapping which shows an excellent left cephalic vein.  She has a palpable left radial pulse.  I recommended proceeding with a left radiocephalic fistula, possibly brachiocephalic if the vein diameter is not appropriate at the wrist.  The patient is on Coumadin.  I will speak with cardiology regarding holding her Coumadin before surgery and whether or not she needs a Lovenox bridge.  I have scheduled her operation which will be a left radiocephalic  fistula on Friday, September 30 is [place] and benefits of the procedure including the risk of steal, the risk of non-maturity, and the need for future interventions.  Annamarie Major

## 2015-02-06 NOTE — Anesthesia Postprocedure Evaluation (Signed)
  Anesthesia Post-op Note  Patient: [name] [name]  Procedure(s) Performed: Procedure(s) (LRB): CREATION OF LEFT ARM  ARTERIOVENOUS (AV) FISTULA  (Left)  Patient Location: PACU  Anesthesia Type: MAC  Level of Consciousness: awake and alert   Airway and Oxygen Therapy: Patient Spontanous Breathing  Post-op Pain: mild  Post-op Assessment: Post-op Vital signs reviewed, Patient's Cardiovascular Status Stable, Respiratory Function Stable, Patent Airway and No signs of Nausea or vomiting  Last Vitals:  Filed Vitals:   02/06/15 1248  BP: 128/58  Pulse:   Temp:   Resp:     Post-op Vital Signs: stable   Complications: No apparent anesthesia complications

## 2015-02-07 ENCOUNTER — Telehealth: Payer: Self-pay | Admitting: Surgery

## 2015-02-07 NOTE — Telephone Encounter (Signed)
-----   Message from Mena Goes, RN sent at 02/06/2015  2:37 PM EDT ----- Regarding: schedule   ----- Message -----    From: Dario Ave    Sent: 02/06/2015  12:43 PM      To: Dario [address], [facility] Charge Pool Subject: Kay's log                                        ----- Message -----    From: Alvia Grove, PA-C    Sent: 02/06/2015  12:16 PM      To: [facility] Charge Pool  S/p left RC-AVF 02/06/15  F/u with Dr. Trula Slade in 6 weeks with duplex  Thanks Maudie Mercury

## 2015-02-07 NOTE — Telephone Encounter (Signed)
LM for pt re appt, dpm °

## 2015-02-08 ENCOUNTER — Encounter (HOSPITAL_COMMUNITY): Payer: Self-pay | Admitting: Surgery

## 2015-02-16 ENCOUNTER — Other Ambulatory Visit (HOSPITAL_COMMUNITY): Payer: Self-pay | Admitting: Nephrology

## 2015-02-16 DIAGNOSIS — N186 End stage renal disease: Secondary | ICD-10-CM

## 2015-02-17 ENCOUNTER — Telehealth: Payer: Self-pay | Admitting: Cardiovascular Disease

## 2015-02-17 ENCOUNTER — Ambulatory Visit (HOSPITAL_COMMUNITY)
Admission: RE | Admit: 2015-02-17 | Discharge: 2015-02-17 | Disposition: A | Discharge status: Home / Self Care | Payer: Medicare Other | Source: Ambulatory Visit | Attending: Nephrology | Admitting: Nephrology

## 2015-02-17 ENCOUNTER — Other Ambulatory Visit (HOSPITAL_COMMUNITY): Payer: Self-pay | Admitting: Nephrology

## 2015-02-17 DIAGNOSIS — Y838 Other surgical procedures as the cause of abnormal reaction of the patient, or of later complication, without mention of misadventure at the time of the procedure: Secondary | ICD-10-CM | POA: Insufficient documentation

## 2015-02-17 DIAGNOSIS — Z881 Allergy status to other antibiotic agents status: Secondary | ICD-10-CM | POA: Insufficient documentation

## 2015-02-17 DIAGNOSIS — T8241XA Breakdown (mechanical) of vascular dialysis catheter, initial encounter: Secondary | ICD-10-CM | POA: Insufficient documentation

## 2015-02-17 DIAGNOSIS — N186 End stage renal disease: Secondary | ICD-10-CM

## 2015-02-17 LAB — POCT I-STAT 4, (NA,K, GLUC, HGB,HCT)
Glucose, Bld: 87 mg/dL (ref 65–99)
HEMATOCRIT: 31 % — AB (ref 36.0–46.0)
HEMOGLOBIN: 10.5 g/dL — AB (ref 12.0–15.0)
POTASSIUM: 6.1 mmol/L — AB (ref 3.5–5.1)
Sodium: 139 mmol/L (ref 135–145)

## 2015-02-17 MED ORDER — CHLORHEXIDINE GLUCONATE 4 % EX LIQD
CUTANEOUS | Status: AC
  Filled 2015-02-17: qty 15

## 2015-02-17 MED ORDER — FENTANYL CITRATE (PF) 100 MCG/2ML IJ SOLN
INTRAMUSCULAR | Status: AC | PRN
  Administered 2015-02-17: 25 ug via INTRAVENOUS

## 2015-02-17 MED ORDER — LIDOCAINE HCL 1 % IJ SOLN
INTRAMUSCULAR | Status: AC
  Filled 2015-02-17: qty 20

## 2015-02-17 MED ORDER — FENTANYL CITRATE (PF) 100 MCG/2ML IJ SOLN
INTRAMUSCULAR | Status: AC
  Filled 2015-02-17: qty 2

## 2015-02-17 MED ORDER — MIDAZOLAM HCL 2 MG/2ML IJ SOLN
INTRAMUSCULAR | Status: AC
  Filled 2015-02-17: qty 2

## 2015-02-17 MED ORDER — VANCOMYCIN HCL IN DEXTROSE 1-5 GM/200ML-% IV SOLN
1000.0000 mg | Freq: Once | INTRAVENOUS | Status: DC
  Filled 2015-02-17 (×2): qty 200

## 2015-02-17 MED ORDER — HEPARIN SODIUM (PORCINE) 1000 UNIT/ML IJ SOLN
INTRAMUSCULAR | Status: AC
  Filled 2015-02-17: qty 1

## 2015-02-17 MED ORDER — MIDAZOLAM HCL 2 MG/2ML IJ SOLN
INTRAMUSCULAR | Status: AC | PRN
  Administered 2015-02-17: 1 mg via INTRAVENOUS

## 2015-02-17 MED ORDER — SODIUM CHLORIDE 0.9 % IV SOLN: INTRAVENOUS | Status: DC

## 2015-02-17 NOTE — Sedation Documentation (Signed)
Patient is resting comfortably. 

## 2015-02-17 NOTE — Telephone Encounter (Signed)
Do not see where INR was checked at [facility] this morning.  LMOM for pt to call back to make an appt to come get INR checked on Thursday 02/19/15 if possible.

## 2015-02-17 NOTE — Telephone Encounter (Signed)
cxl appt today due to having PT INR drawn at [facility] this morning.  Please let patient know what she needs to do with dose

## 2015-02-17 NOTE — Procedures (Signed)
Interventional Radiology Procedure Note  Procedure:  Dialysis catheter exchange  Complications:  None   Estimated Blood Loss:  < 25 mL  New 23 cm tip to cuff Pallindrome catheter placed over wire.  Tip in RA.  OK to use.  Venetia Night. Kathlene Cote, M.D Pager:  [phone]

## 2015-02-17 NOTE — Sedation Documentation (Signed)
Vital signs stable. 

## 2015-02-19 ENCOUNTER — Ambulatory Visit (INDEPENDENT_AMBULATORY_CARE_PROVIDER_SITE_OTHER): Payer: Medicare Other | Admitting: *Deleted

## 2015-02-19 DIAGNOSIS — Z5181 Encounter for therapeutic drug level monitoring: Secondary | ICD-10-CM | POA: Diagnosis not present

## 2015-02-19 DIAGNOSIS — I480 Paroxysmal atrial fibrillation: Secondary | ICD-10-CM | POA: Diagnosis not present

## 2015-02-19 LAB — POCT INR: INR: 2.4

## 2015-02-24 ENCOUNTER — Ambulatory Visit (INDEPENDENT_AMBULATORY_CARE_PROVIDER_SITE_OTHER): Payer: Medicare Other | Admitting: *Deleted

## 2015-02-24 DIAGNOSIS — Z5181 Encounter for therapeutic drug level monitoring: Secondary | ICD-10-CM | POA: Diagnosis not present

## 2015-02-24 DIAGNOSIS — I480 Paroxysmal atrial fibrillation: Secondary | ICD-10-CM | POA: Diagnosis not present

## 2015-02-24 LAB — POCT INR: INR: 3.3

## 2015-03-16 ENCOUNTER — Encounter (HOSPITAL_COMMUNITY): Payer: Medicare Other

## 2015-03-17 ENCOUNTER — Ambulatory Visit (INDEPENDENT_AMBULATORY_CARE_PROVIDER_SITE_OTHER): Payer: Medicare Other | Admitting: *Deleted

## 2015-03-17 DIAGNOSIS — I480 Paroxysmal atrial fibrillation: Secondary | ICD-10-CM

## 2015-03-17 DIAGNOSIS — Z5181 Encounter for therapeutic drug level monitoring: Secondary | ICD-10-CM | POA: Diagnosis not present

## 2015-03-17 LAB — POCT INR: INR: 2.6

## 2015-03-19 ENCOUNTER — Encounter: Payer: Self-pay | Admitting: Surgery

## 2015-03-23 ENCOUNTER — Encounter: Payer: Self-pay | Admitting: Surgery

## 2015-03-23 ENCOUNTER — Ambulatory Visit (HOSPITAL_COMMUNITY)
Admission: RE | Admit: 2015-03-23 | Discharge: 2015-03-23 | Disposition: A | Discharge status: Home / Self Care | Payer: Medicare Other | Source: Ambulatory Visit | Attending: Surgery | Admitting: Surgery

## 2015-03-23 ENCOUNTER — Ambulatory Visit (INDEPENDENT_AMBULATORY_CARE_PROVIDER_SITE_OTHER): Payer: Self-pay | Admitting: Surgery

## 2015-03-23 ENCOUNTER — Encounter: Payer: Medicare Other | Admitting: Surgery

## 2015-03-23 VITALS — BP 167/67 | HR 68 | Ht 62.0 in | Wt 164.4 lb

## 2015-03-23 DIAGNOSIS — N186 End stage renal disease: Secondary | ICD-10-CM | POA: Insufficient documentation

## 2015-03-23 DIAGNOSIS — Z992 Dependence on renal dialysis: Secondary | ICD-10-CM

## 2015-03-23 DIAGNOSIS — Z4931 Encounter for adequacy testing for hemodialysis: Secondary | ICD-10-CM | POA: Insufficient documentation

## 2015-03-23 NOTE — Progress Notes (Signed)
Patient name: [name] [name] MRN: [id number] DOB: [birth year] Sex: female     Chief Complaint  Patient presents with  . Re-evaluation    6 wk f/u s/p L RC AVF - HD TTS    HISTORY OF PRESENT ILLNESS: The patient is back for follow-up.  She is status post left radiocephalic fistula on 54/00/8676.  She reports no symptoms of steal.  She currently dialyzes through a  Past Medical History  Diagnosis Date  . HTN (hypertension)   . Hyperlipidemia   . Hypertrophic cardiomyopathy (HCC)     Mild hypertrophic CM without outflow tract obstruction -- ETT/Myoview EF 74%. no ischemia (12/2006)  --stress echo 8/10: normal BP response to exercise. no presyncope  no wall motion abnormalties. No significant LVOT obstruction. non-diagnositc ST depression  --holter monitor normal   . CAD (coronary artery disease)     --s/p DES LAD and POBA diagonal 11/10  --RHC cath normal  . Obesity   . Pre-syncope   . CHF (congestive heart failure) ([place])   . A-fib ([place])   . COPD (chronic obstructive pulmonary disease) ([place])     pt denies this/not aware of this  . Pneumonia   . ESRD (end stage renal disease) on dialysis [facility])     Dialysis Tuesday/Thursday/Saturday  . Depression     since starting dialysis and dx'ed with multiple myeloma  . Cancer ([place])     multiple myeloma  . Anemia   . PONV (postoperative nausea and vomiting)     nausea after bone marrow biopsy    Past Surgical History  Procedure Laterality Date  . Coronary angioplasty  2010  . Appendectomy    . Abdominal hysterectomy    . Colonoscopy    . Bone marrow biopsy    . Av fistula placement Left 02/06/2015    Procedure: CREATION OF LEFT ARM  ARTERIOVENOUS (AV) FISTULA ;  Surgeon: Serafina Mitchell, MD;  Location: [facility] OR;  Service: Vascular;  Laterality: Left;    Social History   Social History  . Marital Status: Widowed    Spouse Name: N/A  . Number of Children: N/A  . Years of Education: N/A   Occupational History  . Not on file.     Social History Main Topics  . Smoking status: Former Smoker -- 0.25 packs/day for 32 years    Types: Cigarettes    Start date: 07/05/1968    Quit date: 05/09/2000  . Smokeless tobacco: Never Used  . Alcohol Use: No  . Drug Use: No  . Sexual Activity: Not on file   Other Topics Concern  . Not on file   Social History Narrative    Family History  Problem Relation Age of Onset  . CAD Mother   . Multiple myeloma Father   . Diabetes type II Sister   . CAD Brother   . Stroke Sister   . Uterine cancer Sister     Allergies as of 03/23/2015 - Review Complete 03/23/2015  Allergen Reaction Noted  . Morphine Shortness Of Breath   . Phenergan [promethazine hcl] Other (See Comments) 11/17/2014  . Penicillins Rash     Current Outpatient Prescriptions on File Prior to Visit  Medication Sig Dispense Refill  . amiodarone (PACERONE) 200 MG tablet Take 200 mg by mouth daily.     Marland Kitchen amLODipine (NORVASC) 5 MG tablet Take 1 tablet (5 mg total) by mouth daily. 30 tablet 6  . aspirin 81 MG EC tablet Take 81  mg by mouth daily.      Marland Kitchen atorvastatin (LIPITOR) 80 MG tablet Take 80 mg by mouth at bedtime.     . B Complex-C-Folic Acid (VOL-CARE RX) 1 MG TABS Take 1 tablet by mouth daily.    . benzonatate (TESSALON) 100 MG capsule Take 100 mg by mouth 3 (three) times daily as needed for cough.     . denosumab (PROLIA) 60 MG/ML SOLN injection Inject 60 mg into the skin every 6 (six) months. Administer in upper arm, thigh, or abdomen    . dexamethasone (DECADRON) 4 MG tablet Take $RemoveBef'20mg'uxUVnYeiAj$  by mouth once a week as directed by oncologist    . latanoprost (XALATAN) 0.005 % ophthalmic solution Place 1 drop into both eyes at bedtime.    . metoprolol tartrate (LOPRESSOR) 25 MG tablet Take 0.5 tablets (12.5 mg total) by mouth 2 (two) times daily. 90 tablet 3  . pantoprazole (PROTONIX) 40 MG tablet Take 1 tablet (40 mg total) by mouth daily. 30 tablet 0  . senna-docusate (SENOKOT-S) 8.6-50 MG per tablet Take 1  tablet by mouth 2 (two) times daily.     Marland Kitchen warfarin (COUMADIN) 4 MG tablet Take 4 mg by mouth daily.     . traMADol (ULTRAM) 50 MG tablet Take 1 tablet (50 mg total) by mouth every 6 (six) hours as needed. (Patient not taking: Reported on 02/17/2015) 20 tablet 0   No current facility-administered medications on file prior to visit.     PHYSICAL EXAMINATION:   Vital signs are  Filed Vitals:   03/23/15 1212 03/23/15 1215  BP: 168/66 167/67  Pulse: 68   Height: $Remove'5\' 2"'uZjQSGg$  (1.575 m)   Weight: 164 lb 6.4 oz (74.571 kg)   SpO2: 98%    Body mass index is 30.06 kg/(m^2). General: The patient appears their stated age. HEENT:  No gross abnormalities Palpable thrill within fistula.  Diagnostic Studies Ultrasound fistula was performed.  Vein diameter measurements are excellent ranging from 0.54-0.80.  Depth ranges from 0.14-0.34  Assessment: Status post left radiocephalic fistula Plan: Her fistula will be ready for use in mid-December.  Should she had difficulty, she may need branch ligation, however I do not think this is required currently  V. Leia Alf, M.D. [facility] Office: [phone] Pager:  [phone]

## 2015-04-06 ENCOUNTER — Ambulatory Visit: Payer: Medicare Other | Admitting: Cardiovascular Disease

## 2015-04-07 ENCOUNTER — Ambulatory Visit (INDEPENDENT_AMBULATORY_CARE_PROVIDER_SITE_OTHER): Payer: Medicare Other | Admitting: *Deleted

## 2015-04-07 DIAGNOSIS — I480 Paroxysmal atrial fibrillation: Secondary | ICD-10-CM | POA: Diagnosis not present

## 2015-04-07 DIAGNOSIS — Z5181 Encounter for therapeutic drug level monitoring: Secondary | ICD-10-CM

## 2015-04-07 LAB — POCT INR: INR: >8

## 2015-04-15 ENCOUNTER — Encounter: Payer: Self-pay | Admitting: Physician Assistant

## 2015-04-15 ENCOUNTER — Ambulatory Visit (INDEPENDENT_AMBULATORY_CARE_PROVIDER_SITE_OTHER): Payer: Medicare Other | Admitting: Physician Assistant

## 2015-04-15 VITALS — BP 152/70 | HR 86 | Ht 63.0 in | Wt 163.0 lb

## 2015-04-15 DIAGNOSIS — I5032 Chronic diastolic (congestive) heart failure: Secondary | ICD-10-CM | POA: Diagnosis not present

## 2015-04-15 DIAGNOSIS — I2510 Atherosclerotic heart disease of native coronary artery without angina pectoris: Secondary | ICD-10-CM | POA: Diagnosis not present

## 2015-04-15 DIAGNOSIS — I421 Obstructive hypertrophic cardiomyopathy: Secondary | ICD-10-CM | POA: Diagnosis not present

## 2015-04-15 DIAGNOSIS — I10 Essential (primary) hypertension: Secondary | ICD-10-CM | POA: Diagnosis not present

## 2015-04-15 MED ORDER — AMLODIPINE BESYLATE 10 MG PO TABS: 10.0000 mg | ORAL_TABLET | Freq: Every day | ORAL | Status: DC

## 2015-04-15 NOTE — Assessment & Plan Note (Signed)
Blood pressure is elevated. Increase amlodipine

## 2015-04-15 NOTE — Patient Instructions (Signed)
Your physician wants you to follow-up in: 2-3 weeks with  Dr. Bronson Ing. You will receive a reminder letter in the mail two months in advance. If you don't receive a letter, please call our office to schedule the follow-up appointment.  Your physician has recommended you make the following change in your medication:   Increase Norvasc to 10 mg Daily   If you need a refill on your cardiac medications before your next appointment, please call your pharmacy.  Thank you for choosing [facility]!

## 2015-04-15 NOTE — Progress Notes (Signed)
Cardiology Office Note   Date:  04/15/2015   ID:  [name] [name], DOB 01-29-1941, MRN [id number]  PCP:  [name] Burly, MD  Cardiologist: Dr. Bronson Ing  Chief Complaint: Cough, shortness of breath    History of Present Illness: [name] [name] is a 74 y.o. female who presents as an add-on for heart failure. She has a complex cardiovascular history with hypertrophic cardiomyopathy, chronic diastolic heart failure, hypertension, coronary artery disease(LAD stent 2010), PAF on amiodarone and Coumadin, and renal failure on hemodialysis. She also has multiple myeloma and is followed by oncology.  Echocardiogram on 09/29/14 demonstrated vigorous left ventricular systolic function, LVEF 88-41%, grade 2 diastolic dysfunction, moderate mitral stenosis and regurgitation, moderate biatrial dilatation, moderate to severe tricuspid regurgitation, and severely elevated pulmonary pressures, 68 mmHg. LVOT gradient was 28 mmHg at rest and 128 mmHg with Valsalva maneuver.  Nuclear stress testing on 09/30/14 was normal.  It was mentioned in Dr. Elmarie Shiley note on 10/02/14 that the patient should be evaluated by Dr. Burt [name] as an outpatient once renal function improved to assess whether or not she should be a candidate for alcohol septal ablation versus myomectomy if symptoms persisted.  Patient is sent here today by oncology because they thought she was in heart failure. She was in [place] last week with significant cough and a hemoglobin of 7.5. She was transfused 1 unit of blood and given oxygen. She was given at dialysis as well. She was placed on anti-biotic for possible pneumonia or bronchitis. She continues to cough and the [employer] are not helping. She gets short of breath with little activity. Her dialysis days were switched because she had dialysis last Thursday and Friday. She had a Monday this week and isn't scheduled again until tomorrow. She's developed some edema in her legs. Her blood  pressure is high. She had a 2-D echo while in [place] which they brought along. On quick review it doesn't seem significantly changed from prior echo. She has moderate to severe concentric LVH EF 66% abnormal diastolic function RV systolic pressure is 51 mmHg.    Past Medical History  Diagnosis Date  . HTN (hypertension)   . Hyperlipidemia   . Hypertrophic cardiomyopathy (HCC)     Mild hypertrophic CM without outflow tract obstruction -- ETT/Myoview EF 74%. no ischemia (12/2006)  --stress echo 8/10: normal BP response to exercise. no presyncope  no wall motion abnormalties. No significant LVOT obstruction. non-diagnositc ST depression  --holter monitor normal   . CAD (coronary artery disease)     --s/p DES LAD and POBA diagonal 11/10  --RHC cath normal  . Obesity   . Pre-syncope   . CHF (congestive heart failure) ([place])   . A-fib ([place])   . COPD (chronic obstructive pulmonary disease) ([place])     pt denies this/not aware of this  . Pneumonia   . ESRD (end stage renal disease) on dialysis [facility])     Dialysis Tuesday/Thursday/Saturday  . Depression     since starting dialysis and dx'ed with multiple myeloma  . Cancer ([place])     multiple myeloma  . Anemia   . PONV (postoperative nausea and vomiting)     nausea after bone marrow biopsy    Past Surgical History  Procedure Laterality Date  . Coronary angioplasty  2010  . Appendectomy    . Abdominal hysterectomy    . Colonoscopy    . Bone marrow biopsy    . Av fistula placement Left 02/06/2015  Procedure: CREATION OF LEFT ARM  ARTERIOVENOUS (AV) FISTULA ;  Surgeon: Serafina Mitchell, MD;  Location: [facility] OR;  Service: Vascular;  Laterality: Left;     Current Outpatient Prescriptions  Medication Sig Dispense Refill  . amiodarone (PACERONE) 200 MG tablet Take 200 mg by mouth daily.     Marland Kitchen amLODipine (NORVASC) 5 MG tablet Take 1 tablet (5 mg total) by mouth daily. 30 tablet 6  . aspirin 81 MG EC tablet Take 81 mg by mouth daily.      Marland Kitchen  atorvastatin (LIPITOR) 80 MG tablet Take 80 mg by mouth at bedtime.     . benzonatate (TESSALON) 100 MG capsule Take 100 mg by mouth 3 (three) times daily as needed for cough.     . denosumab (PROLIA) 60 MG/ML SOLN injection Inject 60 mg into the skin every 6 (six) months. Administer in upper arm, thigh, or abdomen    . dexamethasone (DECADRON) 4 MG tablet Take 79m by mouth once a week as directed by oncologist    . latanoprost (XALATAN) 0.005 % ophthalmic solution Place 1 drop into both eyes at bedtime.    . metoprolol tartrate (LOPRESSOR) 25 MG tablet Take 0.5 tablets (12.5 mg total) by mouth 2 (two) times daily. 90 tablet 3  . pantoprazole (PROTONIX) 40 MG tablet Take 1 tablet (40 mg total) by mouth daily. 30 tablet 0  . traMADol (ULTRAM) 50 MG tablet Take 1 tablet (50 mg total) by mouth every 6 (six) hours as needed. 20 tablet 0  . warfarin (COUMADIN) 4 MG tablet Take 4 mg by mouth daily.      No current facility-administered medications for this visit.    Allergies:   Morphine; Phenergan; and Penicillins    Social History:  The patient  reports that she quit smoking about 14 years ago. Her smoking use included Cigarettes. She started smoking about 46 years ago. She has a 8 pack-year smoking history. She has never used smokeless tobacco. She reports that she does not drink alcohol or use illicit drugs.   Family History:  The patient's    family history includes CAD in her brother and mother; Diabetes type II in her sister; Multiple myeloma in her father; Stroke in her sister; Uterine cancer in her sister.    ROS:  Please see the history of present illness.   Otherwise, review of systems are positive for feeling poorly in general on dialysis and receiving chemotherapy.   All other systems are reviewed and negative.    PHYSICAL EXAM: VS:  BP 152/70 mmHg  Pulse 86  Ht _0  (1.6 m)  Wt 163 lb (73.936 kg)  BMI 28.88 kg/m2  SpO2 95% , BMI Body mass index is 28.88 kg/(m^2). GEN: Well  nourished, well developed, in no acute distress Neck: no JVD, HJR, carotid bruits, or masses Cardiac: RRR; 2 to 3/6 systolic murmur at the left sternal border, no gallop, rubs, thrill or heave,  Respiratory:  clear to auscultation bilaterally, normal work of breathing GI: soft, nontender, nondistended, + BS MS: no deformity or atrophy Extremities: +1-2 edema without cyanosis, clubbing, edema, good distal pulses bilaterally.  Skin: warm and dry, no rash Neuro:  Strength and sensation are intact    EKG:  EKG is not ordered today.    Recent Labs: 09/28/2014: B Natriuretic Peptide 597.6* 09/29/2014: TSH 3.099 10/01/2014: Magnesium 2.2 10/05/2014: ALT 68* 10/10/2014: BUN 62*; Creatinine, Ser 3.64*; Platelets 95* 02/17/2015: Hemoglobin 10.5*; Potassium 6.1*; Sodium 139  Lipid Panel No results found for: CHOL, TRIG, HDL, CHOLHDL, VLDL, LDLCALC, LDLDIRECT    Wt Readings from Last 3 Encounters:  04/15/15 163 lb (73.936 kg)  03/23/15 164 lb 6.4 oz (74.571 kg)  02/06/15 162 lb (73.483 kg)      Other studies Reviewed: Additional studies/ records that were reviewed today include and review of the records demonstrates:  NST completed. Normal study with normal perfusion. Low risk test. EF 65%. Patient notified of results.     Study Conclusions  - Left ventricle: The cavity size was normal. There was severe   focal basal and moderate concentric hypertrophy of the left   ventricle. Systolic function was vigorous. The estimated ejection   fraction was in the range of 65% to 70%. Wall motion was normal;   there were no regional wall motion abnormalities. Features are   consistent with a pseudonormal left ventricular filling pattern,   with concomitant abnormal relaxation and increased filling   pressure (grade 2 diastolic dysfunction). Doppler parameters are   consistent with elevated ventricular end-diastolic filling   pressure. - Mitral valve: Calcified annulus. Moderately thickened,  moderately   calcified leaflets . The findings are consistent with moderate   stenosis. There was moderate regurgitation directed centrally. - Left atrium: The atrium was moderately dilated. - Right atrium: The atrium was moderately dilated. - Tricuspid valve: There was moderate-severe regurgitation. - Pulmonary arteries: PA peak pressure: 68 mm Hg (S).  Impressions:  - There is severe basal septal hypertrophy. LVEF is hyperdanamic.   There is chordal systolic anterior motion of the mitral valve.   LVOT gradient is 28 mmHg at rest and 128 mmHg with Valsalva   maneuver.     Findings are consistent with hypertrophic cardiomyopathy. CMR is   recommended for risk stratification.         ASSESSMENT AND PLAN:  Chronic diastolic heart failure Patient has extra fluid on board today. She is scheduled for dialysis tomorrow and they will have to take some extra off. Her O2 sats were stable at 95% in the [place]. Will increase Norvasc to 10 mg daily on nondialysis days. 5 mg on dialysis days. Follow-up with Dr.Koneswaran in 2-3 weeks.  HYPERTENSION, BENIGN Blood pressure is elevated. Increase amlodipine  HOCM (hypertrophic obstructive cardiomyopathy) Recent 2-D echo from [facility] to that seem to have any significant change. We'll have Dr.Koneswaran review.  Coronary atherosclerosis of native coronary artery Stable without chest pain    Signed, Ermalinda Barrios, PA-C  04/15/2015 2:25 PM    [facility] [address], [place], [place]  [address] Phone: [phone]; Fax: [phone]

## 2015-04-15 NOTE — Assessment & Plan Note (Signed)
Stable without chest pain 

## 2015-04-15 NOTE — Assessment & Plan Note (Signed)
Recent 2-D echo from [facility] to that seem to have any significant change. We'll have Dr.Koneswaran review.

## 2015-04-15 NOTE — Assessment & Plan Note (Signed)
Patient has extra fluid on board today. She is scheduled for dialysis tomorrow and they will have to take some extra off. Her O2 sats were stable at 95% in the [place]. Will increase Norvasc to 10 mg daily on nondialysis days. 5 mg on dialysis days. Follow-up with Dr.Koneswaran in 2-3 weeks.

## 2015-04-18 ENCOUNTER — Inpatient Hospital Stay (HOSPITAL_COMMUNITY)
Admission: EM | Admit: 2015-04-18 | Discharge: 2015-04-21 | DRG: 291 | Disposition: A | Discharge status: Home / Self Care | Payer: Medicare Other | Attending: Internal Medicine | Admitting: Internal Medicine

## 2015-04-18 ENCOUNTER — Encounter (HOSPITAL_COMMUNITY): Payer: Self-pay | Admitting: Family Medicine

## 2015-04-18 ENCOUNTER — Emergency Department (HOSPITAL_COMMUNITY): Payer: Medicare Other

## 2015-04-18 DIAGNOSIS — N189 Chronic kidney disease, unspecified: Secondary | ICD-10-CM | POA: Diagnosis not present

## 2015-04-18 DIAGNOSIS — Z7982 Long term (current) use of aspirin: Secondary | ICD-10-CM

## 2015-04-18 DIAGNOSIS — R079 Chest pain, unspecified: Secondary | ICD-10-CM | POA: Diagnosis present

## 2015-04-18 DIAGNOSIS — Z807 Family history of other malignant neoplasms of lymphoid, hematopoietic and related tissues: Secondary | ICD-10-CM

## 2015-04-18 DIAGNOSIS — Z79899 Other long term (current) drug therapy: Secondary | ICD-10-CM

## 2015-04-18 DIAGNOSIS — I132 Hypertensive heart and chronic kidney disease with heart failure and with stage 5 chronic kidney disease, or end stage renal disease: Secondary | ICD-10-CM | POA: Diagnosis not present

## 2015-04-18 DIAGNOSIS — I422 Other hypertrophic cardiomyopathy: Secondary | ICD-10-CM | POA: Diagnosis present

## 2015-04-18 DIAGNOSIS — Z8249 Family history of ischemic heart disease and other diseases of the circulatory system: Secondary | ICD-10-CM

## 2015-04-18 DIAGNOSIS — R748 Abnormal levels of other serum enzymes: Secondary | ICD-10-CM | POA: Diagnosis present

## 2015-04-18 DIAGNOSIS — M940 Chondrocostal junction syndrome [Tietze]: Secondary | ICD-10-CM

## 2015-04-18 DIAGNOSIS — Z87891 Personal history of nicotine dependence: Secondary | ICD-10-CM

## 2015-04-18 DIAGNOSIS — Z7901 Long term (current) use of anticoagulants: Secondary | ICD-10-CM

## 2015-04-18 DIAGNOSIS — C9000 Multiple myeloma not having achieved remission: Secondary | ICD-10-CM

## 2015-04-18 DIAGNOSIS — D649 Anemia, unspecified: Secondary | ICD-10-CM | POA: Diagnosis present

## 2015-04-18 DIAGNOSIS — Z955 Presence of coronary angioplasty implant and graft: Secondary | ICD-10-CM

## 2015-04-18 DIAGNOSIS — E785 Hyperlipidemia, unspecified: Secondary | ICD-10-CM | POA: Diagnosis present

## 2015-04-18 DIAGNOSIS — J449 Chronic obstructive pulmonary disease, unspecified: Secondary | ICD-10-CM | POA: Diagnosis present

## 2015-04-18 DIAGNOSIS — Z880 Allergy status to penicillin: Secondary | ICD-10-CM

## 2015-04-18 DIAGNOSIS — Z6827 Body mass index (BMI) 27.0-27.9, adult: Secondary | ICD-10-CM

## 2015-04-18 DIAGNOSIS — R778 Other specified abnormalities of plasma proteins: Secondary | ICD-10-CM

## 2015-04-18 DIAGNOSIS — R0789 Other chest pain: Secondary | ICD-10-CM | POA: Diagnosis present

## 2015-04-18 DIAGNOSIS — Z888 Allergy status to other drugs, medicaments and biological substances status: Secondary | ICD-10-CM

## 2015-04-18 DIAGNOSIS — Z885 Allergy status to narcotic agent status: Secondary | ICD-10-CM

## 2015-04-18 DIAGNOSIS — F329 Major depressive disorder, single episode, unspecified: Secondary | ICD-10-CM | POA: Diagnosis present

## 2015-04-18 DIAGNOSIS — N186 End stage renal disease: Secondary | ICD-10-CM | POA: Diagnosis present

## 2015-04-18 DIAGNOSIS — D631 Anemia in chronic kidney disease: Secondary | ICD-10-CM

## 2015-04-18 DIAGNOSIS — I480 Paroxysmal atrial fibrillation: Secondary | ICD-10-CM | POA: Diagnosis present

## 2015-04-18 DIAGNOSIS — I5032 Chronic diastolic (congestive) heart failure: Secondary | ICD-10-CM | POA: Diagnosis not present

## 2015-04-18 DIAGNOSIS — Z90710 Acquired absence of both cervix and uterus: Secondary | ICD-10-CM

## 2015-04-18 DIAGNOSIS — Z992 Dependence on renal dialysis: Secondary | ICD-10-CM

## 2015-04-18 DIAGNOSIS — R05 Cough: Secondary | ICD-10-CM | POA: Diagnosis not present

## 2015-04-18 DIAGNOSIS — I10 Essential (primary) hypertension: Secondary | ICD-10-CM | POA: Diagnosis present

## 2015-04-18 DIAGNOSIS — R053 Chronic cough: Secondary | ICD-10-CM | POA: Diagnosis present

## 2015-04-18 DIAGNOSIS — I452 Bifascicular block: Secondary | ICD-10-CM | POA: Diagnosis present

## 2015-04-18 DIAGNOSIS — R7989 Other specified abnormal findings of blood chemistry: Secondary | ICD-10-CM

## 2015-04-18 DIAGNOSIS — E669 Obesity, unspecified: Secondary | ICD-10-CM | POA: Diagnosis present

## 2015-04-18 DIAGNOSIS — K219 Gastro-esophageal reflux disease without esophagitis: Secondary | ICD-10-CM

## 2015-04-18 DIAGNOSIS — I2510 Atherosclerotic heart disease of native coronary artery without angina pectoris: Secondary | ICD-10-CM | POA: Diagnosis present

## 2015-04-18 LAB — URINALYSIS, ROUTINE W REFLEX MICROSCOPIC
BILIRUBIN URINE: NEGATIVE
GLUCOSE, UA: NEGATIVE mg/dL
KETONES UR: NEGATIVE mg/dL
Leukocytes, UA: NEGATIVE
Nitrite: NEGATIVE
PH: 8.5 — AB (ref 5.0–8.0)
PROTEIN: 100 mg/dL — AB
Specific Gravity, Urine: 1.014 (ref 1.005–1.030)

## 2015-04-18 LAB — CBC WITH DIFFERENTIAL/PLATELET
BASOS PCT: 1 %
Basophils Absolute: 0.1 10*3/uL (ref 0.0–0.1)
EOS PCT: 2 %
Eosinophils Absolute: 0.1 10*3/uL (ref 0.0–0.7)
HCT: 27.3 % — ABNORMAL LOW (ref 36.0–46.0)
Hemoglobin: 8.5 g/dL — ABNORMAL LOW (ref 12.0–15.0)
Lymphocytes Relative: 20 %
Lymphs Abs: 1.4 10*3/uL (ref 0.7–4.0)
MCH: 30.8 pg (ref 26.0–34.0)
MCHC: 31.1 g/dL (ref 30.0–36.0)
MCV: 98.9 fL (ref 78.0–100.0)
MONO ABS: 1.3 10*3/uL — AB (ref 0.1–1.0)
Monocytes Relative: 19 %
NEUTROS ABS: 4.2 10*3/uL (ref 1.7–7.7)
NEUTROS PCT: 58 %
PLATELETS: 159 10*3/uL (ref 150–400)
RBC: 2.76 MIL/uL — ABNORMAL LOW (ref 3.87–5.11)
RDW: 17.3 % — ABNORMAL HIGH (ref 11.5–15.5)
WBC: 7.1 10*3/uL (ref 4.0–10.5)

## 2015-04-18 LAB — I-STAT TROPONIN, ED: TROPONIN I, POC: 0.15 ng/mL — AB (ref 0.00–0.08)

## 2015-04-18 LAB — COMPREHENSIVE METABOLIC PANEL
ALBUMIN: 2.9 g/dL — AB (ref 3.5–5.0)
ALT: 25 U/L (ref 14–54)
ANION GAP: 11 (ref 5–15)
AST: 36 U/L (ref 15–41)
Alkaline Phosphatase: 78 U/L (ref 38–126)
BUN: 14 mg/dL (ref 6–20)
CHLORIDE: 99 mmol/L — AB (ref 101–111)
CO2: 28 mmol/L (ref 22–32)
Calcium: 9 mg/dL (ref 8.9–10.3)
Creatinine, Ser: 4.81 mg/dL — ABNORMAL HIGH (ref 0.44–1.00)
GFR calc Af Amer: 9 mL/min — ABNORMAL LOW (ref >60)
GFR, EST NON AFRICAN AMERICAN: 8 mL/min — AB (ref >60)
Glucose, Bld: 126 mg/dL — ABNORMAL HIGH (ref 65–99)
POTASSIUM: 4 mmol/L (ref 3.5–5.1)
Sodium: 138 mmol/L (ref 135–145)
Total Bilirubin: 1.1 mg/dL (ref 0.3–1.2)
Total Protein: 6.1 g/dL — ABNORMAL LOW (ref 6.5–8.1)

## 2015-04-18 LAB — PROTIME-INR
INR: 1.1 (ref 0.00–1.49)
Prothrombin Time: 14.4 seconds (ref 11.6–15.2)

## 2015-04-18 LAB — URINE MICROSCOPIC-ADD ON
Bacteria, UA: NONE SEEN
RBC / HPF: NONE SEEN RBC/hpf (ref 0–5)

## 2015-04-18 MED ORDER — BENZONATATE 100 MG PO CAPS
200.0000 mg | ORAL_CAPSULE | ORAL | Status: AC
  Administered 2015-04-19: 200 mg via ORAL
  Filled 2015-04-18: qty 2

## 2015-04-18 NOTE — ED Notes (Signed)
Pt here for cough, weakness, SOB worsening since being released from the hospital last Friday. [place] under left breast more with coughing.

## 2015-04-18 NOTE — ED Provider Notes (Signed)
CSN: [id number]     Arrival date & time 04/18/15  1725 History   First MD Initiated Contact with Patient 04/18/15 1730     Chief Complaint  Patient presents with  . Cough  . Weakness     (Consider location/radiation/quality/duration/timing/severity/associated sxs/prior Treatment) HPI [name] [name] is a 74 y.o. female with a PMH of hypertrophic cardiomyopathy, HTN, dCHF, HLD, CAD (stent 2010), paroxsymal afib (coumadin), multiple myeloma (undergoing chemotherapy), ESRD on HD ([facility]), who presents to the emergency department with fatigue, generalized weakness, shortness of breath, chest pain. Patient reports a 2-3 week history of progressively worsening shortness of breath with cough. Associated with left-sided aching chest pain, constant, non-radiating. Worse with movement and laying on her left side. Rest improves the pain. Not taking anything for the pain. States that the cough has worsened over the past 2 weeks, no sputum production, no fevers or chills. Recently hospitalized at [facility] and treated for bronchitis. Today at dialysis she was not able to have as much fluid removed due to coughing. Last chemotherapy Wed. Just restarted Chemotherapy after holding if for 6 weeks d/t generalized weakness, states weakness did not improve off of chemotherapy.  Past Medical History  Diagnosis Date  . HTN (hypertension)   . Hyperlipidemia   . Hypertrophic cardiomyopathy (HCC)     Mild hypertrophic CM without outflow tract obstruction -- ETT/Myoview EF 74%. no ischemia (12/2006)  --stress echo 8/10: normal BP response to exercise. no presyncope  no wall motion abnormalties. No significant LVOT obstruction. non-diagnositc ST depression  --holter monitor normal   . CAD (coronary artery disease)     --s/p DES LAD and POBA diagonal 11/10  --RHC cath normal  . Obesity   . Pre-syncope   . CHF (congestive heart failure) ([place])   . A-fib ([place])   . COPD (chronic obstructive pulmonary disease) ([place])    pt denies this/not aware of this  . Pneumonia   . ESRD (end stage renal disease) on dialysis [facility])     Dialysis Tuesday/Thursday/Saturday  . Depression     since starting dialysis and dx'ed with multiple myeloma  . Cancer ([place])     multiple myeloma  . Anemia   . PONV (postoperative nausea and vomiting)     nausea after bone marrow biopsy   Past Surgical History  Procedure Laterality Date  . Coronary angioplasty  2010  . Appendectomy    . Abdominal hysterectomy    . Colonoscopy    . Bone marrow biopsy    . Av fistula placement Left 02/06/2015    Procedure: CREATION OF LEFT ARM  ARTERIOVENOUS (AV) FISTULA ;  Surgeon: Serafina Mitchell, MD;  Location: [facility] OR;  Service: Vascular;  Laterality: Left;   Family History  Problem Relation Age of Onset  . CAD Mother   . Multiple myeloma Father   . Diabetes type II Sister   . CAD Brother   . Stroke Sister   . Uterine cancer Sister    Social History  Substance Use Topics  . Smoking status: Former Smoker -- 0.25 packs/day for 32 years    Types: Cigarettes    Start date: 07/05/1968    Quit date: 05/09/2000  . Smokeless tobacco: Never Used  . Alcohol Use: No   OB History    No data available     Review of Systems  Constitutional: Positive for fatigue. Negative for fever and appetite change.  HENT: Negative for congestion.   Respiratory: Positive for cough, chest tightness and  shortness of breath. Negative for wheezing.   Cardiovascular: Positive for chest pain. Negative for leg swelling.  Gastrointestinal: Negative for vomiting, abdominal pain, diarrhea and blood in stool.  Genitourinary: Negative for dysuria, frequency and hematuria.  Musculoskeletal: Negative for back pain.  Skin: Negative for rash.  Neurological: Positive for weakness (generalized). Negative for dizziness, seizures and light-headedness.  Psychiatric/Behavioral: Negative for behavioral problems.      Allergies  Morphine; Phenergan; and Penicillins  Home  Medications   Prior to Admission medications   Medication Sig Start Date End Date Taking? Authorizing Provider  amiodarone (PACERONE) 200 MG tablet Take 200 mg by mouth daily.  11/13/14 11/13/15 Yes Historical Provider, MD  amLODipine (NORVASC) 10 MG tablet Take 1 tablet (10 mg total) by mouth daily. 04/15/15  Yes Imogene Burn, PA-C  aspirin 81 MG EC tablet Take 81 mg by mouth daily.     Yes Historical Provider, MD  atorvastatin (LIPITOR) 80 MG tablet Take 80 mg by mouth at bedtime.    Yes Historical Provider, MD  benzonatate (TESSALON) 100 MG capsule Take 100 mg by mouth 3 (three) times daily as needed for cough.  12/26/14  Yes Historical Provider, MD  denosumab (PROLIA) 60 MG/ML SOLN injection Inject 60 mg into the skin every 6 (six) months. Administer in upper arm, thigh, or abdomen   Yes Historical Provider, MD  dexamethasone (DECADRON) 4 MG tablet Take 34m by mouth once a week as directed by oncologist 11/21/14  Yes Historical Provider, MD  latanoprost (XALATAN) 0.005 % ophthalmic solution Place 1 drop into both eyes at bedtime.   Yes Historical Provider, MD  metoprolol tartrate (LOPRESSOR) 25 MG tablet Take 0.5 tablets (12.5 mg total) by mouth 2 (two) times daily. 01/01/15  Yes SHerminio Commons MD  pantoprazole (PROTONIX) 40 MG tablet Take 1 tablet (40 mg total) by mouth daily. 10/10/14  Yes PDomenic Polite MD  traMADol (ULTRAM) 50 MG tablet Take 1 tablet (50 mg total) by mouth every 6 (six) hours as needed. 02/06/15   KAlvia Grove PA-C  warfarin (COUMADIN) 4 MG tablet Take 2-4 mg by mouth daily at 6 PM.  11/13/14 11/13/15  Historical Provider, MD   BP 126/40 mmHg  Pulse 64  Temp(Src) 97.8 F (36.6 C) (Axillary)  Resp 18  Ht _0  (1.575 m)  Wt 71.578 kg  BMI 28.85 kg/m2  SpO2 98% Physical Exam  Constitutional: She is oriented to person, place, and time. She appears well-developed and well-nourished. No distress.  HENT:  Head: Atraumatic.  Mouth/Throat: Oropharynx is clear and  moist.  Eyes: Conjunctivae and EOM are normal. Pupils are equal, round, and reactive to light.  Neck: Normal range of motion. No JVD present. No tracheal deviation present.  Cardiovascular: Normal rate, regular rhythm, normal heart sounds and intact distal pulses.   Pulmonary/Chest:  Increase work of breathing, accessory muscle use. Crackles of the lower lung fields  Abdominal: Soft. She exhibits no distension. There is no tenderness. There is no rebound and no guarding.  Musculoskeletal: Normal range of motion. She exhibits no edema.  Neurological: She is alert and oriented to person, place, and time.  Skin: Skin is warm. No pallor.  Psychiatric: She has a normal mood and affect.  Vitals reviewed.   ED Course  Procedures (including critical care time) Labs Review Labs Reviewed  CBC WITH DIFFERENTIAL/PLATELET - Abnormal; Notable for the following:    RBC 2.76 (*)    Hemoglobin 8.5 (*)    HCT 27.3 (*)  RDW 17.3 (*)    Monocytes Absolute 1.3 (*)    All other components within normal limits  COMPREHENSIVE METABOLIC PANEL - Abnormal; Notable for the following:    Chloride 99 (*)    Glucose, Bld 126 (*)    Creatinine, Ser 4.81 (*)    Total Protein 6.1 (*)    Albumin 2.9 (*)    GFR calc non Af Amer 8 (*)    GFR calc Af Amer 9 (*)    All other components within normal limits  URINALYSIS, ROUTINE W REFLEX MICROSCOPIC (NOT AT [facility]) - Abnormal; Notable for the following:    pH 8.5 (*)    Hgb urine dipstick TRACE (*)    Protein, ur 100 (*)    All other components within normal limits  URINE MICROSCOPIC-ADD ON - Abnormal; Notable for the following:    Squamous Epithelial / LPF 0-5 (*)    All other components within normal limits  TROPONIN I - Abnormal; Notable for the following:    Troponin I 0.13 (*)    All other components within normal limits  TROPONIN I - Abnormal; Notable for the following:    Troponin I 0.13 (*)    All other components within normal limits  TROPONIN I -  Abnormal; Notable for the following:    Troponin I 0.14 (*)    All other components within normal limits  CBC WITH DIFFERENTIAL/PLATELET - Abnormal; Notable for the following:    RBC 2.59 (*)    Hemoglobin 8.1 (*)    HCT 25.8 (*)    RDW 17.3 (*)    Platelets 138 (*)    All other components within normal limits  RENAL FUNCTION PANEL - Abnormal; Notable for the following:    Chloride 99 (*)    Glucose, Bld 123 (*)    Creatinine, Ser 5.80 (*)    Albumin 2.6 (*)    GFR calc non Af Amer 6 (*)    GFR calc Af Amer 7 (*)    All other components within normal limits  BRAIN NATRIURETIC PEPTIDE - Abnormal; Notable for the following:    B Natriuretic Peptide 1634.1 (*)    All other components within normal limits  I-STAT TROPOININ, ED - Abnormal; Notable for the following:    Troponin i, poc 0.15 (*)    All other components within normal limits  PROTIME-INR  PROTIME-INR  OCCULT BLOOD X 1 CARD TO LAB, STOOL    Imaging Review Dg Chest 2 View  04/18/2015  CLINICAL DATA:  Cough, shortness of breath. EXAM: CHEST  2 VIEW COMPARISON:  April 10, 2015. FINDINGS: Stable cardiomegaly. No pneumothorax or pleural effusion is noted. Stable compression fracture of lower thoracic spine. Right internal jugular dialysis catheter is unchanged, with distal tip in right atrium. Stable mild central pulmonary vascular congestion is noted. No acute pulmonary disease is noted. IMPRESSION: Stable cardiomegaly and mild central pulmonary vascular congestion. Electronically Signed   By: Marijo Conception, M.D.   On: 04/18/2015 19:17   I have personally reviewed and evaluated these images and lab results as part of my medical decision-making.   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 female with a PMH of hypertrophic cardiomyopathy, HTN, dCHF, HLD, CAD (stent 2010), paroxsymal afib (coumadin), multiple myeloma (undergoing chemotherapy), ESRD on HD ([facility]), who presents to the emergency department with fatigue, generalized weakness, shortness of breath, chest pain. On arrival, no acute distress, not ill appearing. Afebrile, hemodynamically stable. Exam as above, notable for crackles of the lower lung fields, intact distal pulses, no lower extremity edema.   EKG showed sinus rhythm, RBBB, not present on last EKG; however, has been present in EKGs in the past. Intermittently with RBBB. CXR showed mild pulmonary vascular congestion. No leukocytosis. No significant electrolyte abnormalities. Hgb 8.5 (9.3 1 week ago per family). Patient without melena or source of bleeding, likely d/t chemotherapy effects. UA showed no signs of infection. Troponin elevated at 0.15  Given the patient's high risk for ACS and elevated troponin, will admit to internal medicine for serial troponin and treatment of shortness of breath.  Patient with ESRD, however, troponin has never been elevated in the past. Patient stable for the floor. Transferred to the floor in stable condition.     Nathaniel Man, MD 04/19/15 [id number]  [name] Rank, MD 04/19/15 [phone]

## 2015-04-18 NOTE — H&P (Signed)
[facility] History and Physical  [name] [name] JAS:[id number] DOB: 08-14-1940 DOA: 04/18/2015  Referring physician:ED PCP: Neale Burly, MD   Chief Complaint: Cough, left-sided chest pain, shortness of breath  HPI:  [name] [name] has a complex cardiovascular history including HCOM, HTN, chronic dCHF,CAD s/p stent  LAD in 2010, PAF on amiodarone and Coumadin, multiple myeloma, and ESRD on HD T/TH/sat; who presents with complaints of continued cough, left-sided chest pain , and congestion/shortness of breath.  She was just recently specialized last week on 11/29 at [facility] secondary to cough and low hbg of 7.5g/dL.  all she was there she was transfused 1 unit of PRBC, given oxygen, received dialysis, and was placed on antibiotics for possible pneumonia or bronchitis( which family states was never confirmed). She had a 2-D echo while in [place] that appears similar to the previous study time in 09/2014. She was discharged home and had a follow-up appointment at  the cardiologist office 3 days ago (12/07). Reports that no changes were made to her medication regimen. Office note with Bonnell Public, PA-C states patient should receive extra fluid taken off from dialysis, should increase Norvasc to 10 mg daily on nondialysis days /5 mg on dialysis days, office note makes it seem like she is on Coumadin, and she was to follow-up with Dr. Bronson Ing in 2-3 weeks. However, she continued to have chest pain, cough, shortness of breath, and presented here to the emergency department of [facility] with her 2 sons after dialysis today. Chest pain located along the inferior border of her left breast has been ongoing for about 2 weeks. She reports is a achy pain that is worsened by coughing or trying to lay on her left side at night. Associated symptoms include shortness of breath related to coughing and chest pain. Cough symptoms had started back in May of 2016. She states that she was initially started on  [employer] while at [facility] in May, which had improved her cough.  Prior to her last hospitalization at [facility] symptoms had returned. She describes the cough as a dry hacking cough. Worsened with talking. She gets short of breath with little activity and this also worsens cough.  Patient notes that she had a supratherapeutic INR >8 on 11/29 prior to being admitted at [facility] for which she was advised by the Coumadin nurse to stop Coumadin. She is on Coumadin for a history of PAF. She reports that during her hospitalization she was given possibly vitamin K because of the supratherapeutic INR, however it seems that Coumadin was never restarted during or at the end of her hospitalization.  ED physician notes that it was reported that fluid was not able to be pulled off as well during dialysis. Chest x-ray here similar to with reports of mild central pulmonary congestion and labs showing troponin elevated at .15ng/dL .  Review of Systems  Constitutional: Positive for malaise/fatigue. Negative for weight loss.  HENT: Negative for ear discharge and ear pain.   Eyes: Negative for photophobia and pain.  Respiratory: Positive for cough and shortness of breath. Negative for hemoptysis and sputum production.   Cardiovascular: Positive for chest pain, orthopnea and leg swelling (but improved per patient).  Gastrointestinal: Negative for vomiting, abdominal pain, diarrhea, blood in stool and melena.  Genitourinary: Negative for urgency and frequency.  Musculoskeletal: Negative for back pain and neck pain.  Skin: Negative for itching and rash.  Neurological: Negative for sensory change and loss of consciousness.  Endo/Heme/Allergies: Negative  for environmental allergies. Bruises/bleeds easily.  Psychiatric/Behavioral: Negative for suicidal ideas and substance abuse.     Past Medical History  Diagnosis Date  . HTN (hypertension)   . Hyperlipidemia   . Hypertrophic cardiomyopathy (HCC)      Mild hypertrophic CM without outflow tract obstruction -- ETT/Myoview EF 74%. no ischemia (12/2006)  --stress echo 8/10: normal BP response to exercise. no presyncope  no wall motion abnormalties. No significant LVOT obstruction. non-diagnositc ST depression  --holter monitor normal   . CAD (coronary artery disease)     --s/p DES LAD and POBA diagonal 11/10  --RHC cath normal  . Obesity   . Pre-syncope   . CHF (congestive heart failure) ([place])   . A-fib ([place])   . COPD (chronic obstructive pulmonary disease) ([place])     pt denies this/not aware of this  . Pneumonia   . ESRD (end stage renal disease) on dialysis [facility])     Dialysis Tuesday/Thursday/Saturday  . Depression     since starting dialysis and dx'ed with multiple myeloma  . Cancer ([place])     multiple myeloma  . Anemia   . PONV (postoperative nausea and vomiting)     nausea after bone marrow biopsy     Past Surgical History  Procedure Laterality Date  . Coronary angioplasty  2010  . Appendectomy    . Abdominal hysterectomy    . Colonoscopy    . Bone marrow biopsy    . Av fistula placement Left 02/06/2015    Procedure: CREATION OF LEFT ARM  ARTERIOVENOUS (AV) FISTULA ;  Surgeon: Serafina Mitchell, MD;  Location: [facility] OR;  Service: Vascular;  Laterality: Left;      Social History:  reports that she quit smoking about 14 years ago. Her smoking use included Cigarettes. She started smoking about 46 years ago. She has a 8 pack-year smoking history. She has never used smokeless tobacco. She reports that she does not drink alcohol or use illicit drugs. Where does patient live--home   Can patient participate in ADLs? YES  Allergies  Allergen Reactions  . Morphine Shortness Of Breath and Other (See Comments)    Eyes became bloodshot  . Phenergan [Promethazine Hcl] Other (See Comments)    Restless, loopy  . Penicillins Rash    Family History  Problem Relation Age of Onset  . CAD Mother   . Multiple myeloma Father   . Diabetes  type II Sister   . CAD Brother   . Stroke Sister   . Uterine cancer Sister         Prior to Admission medications   Medication Sig Start Date End Date Taking? Authorizing Provider  amiodarone (PACERONE) 200 MG tablet Take 200 mg by mouth daily.  11/13/14 11/13/15 Yes Historical Provider, MD  amLODipine (NORVASC) 10 MG tablet Take 1 tablet (10 mg total) by mouth daily. 04/15/15  Yes Imogene Burn, PA-C  aspirin 81 MG EC tablet Take 81 mg by mouth daily.     Yes Historical Provider, MD  atorvastatin (LIPITOR) 80 MG tablet Take 80 mg by mouth at bedtime.    Yes Historical Provider, MD  benzonatate (TESSALON) 100 MG capsule Take 100 mg by mouth 3 (three) times daily as needed for cough.  12/26/14  Yes Historical Provider, MD  denosumab (PROLIA) 60 MG/ML SOLN injection Inject 60 mg into the skin every 6 (six) months. Administer in upper arm, thigh, or abdomen   Yes Historical Provider, MD  dexamethasone (DECADRON) 4 MG  tablet Take 5m by mouth once a week as directed by oncologist 11/21/14  Yes Historical Provider, MD  latanoprost (XALATAN) 0.005 % ophthalmic solution Place 1 drop into both eyes at bedtime.   Yes Historical Provider, MD  metoprolol tartrate (LOPRESSOR) 25 MG tablet Take 0.5 tablets (12.5 mg total) by mouth 2 (two) times daily. 01/01/15  Yes SHerminio Commons MD  pantoprazole (PROTONIX) 40 MG tablet Take 1 tablet (40 mg total) by mouth daily. 10/10/14  Yes PDomenic Polite MD  traMADol (ULTRAM) 50 MG tablet Take 1 tablet (50 mg total) by mouth every 6 (six) hours as needed. 02/06/15   KAlvia Grove PA-C  warfarin (COUMADIN) 4 MG tablet Take 2-4 mg by mouth daily at 6 PM.  11/13/14 11/13/15  Historical Provider, MD     Physical Exam: Filed Vitals:   04/18/15 2045 04/18/15 2100 04/18/15 2115 04/18/15 2130  BP: 152/50 148/93 155/52 139/58  Pulse: 83 84 81 81  Temp:      TempSrc:      Resp: _0 Height:      Weight:      SpO2: 97% 96% 94% 99%     Constitutional: Vital  signs reviewed. Patient is a short elderly obese female who appears to be in no acute distress  Head: Normocephalic and atraumatic  Ear: TM normal bilaterally  Mouth: no erythema or exudates, MMM  Eyes: PERRL, EOMI, conjunctivae normal, No scleral icterus.  Neck: Supple, Trachea midline normal ROM, No JVD, mass, thyromegaly, or carotid bruit present.  Cardiovascular: RRR, S1 normal, S2 normal, +SEM, pulses symmetric and intact bilaterally  Pulmonary/Chest: Patient speaks in short sentences secondary to cough. Fine  crackles with decreased overall air movement. Tenderness to palpation over the left inferior breast border. No wheezes, rales, or rhonchi  Abdominal: Soft. Non-tender, non-distended, bowel sounds are normal, no masses, organomegaly, or guarding present.  GU: no CVA tenderness Musculoskeletal: No joint deformities, erythema, or stiffness, ROM full and no nontender Ext: no edema and no cyanosis, pulses palpable bilaterally (DP and PT) port of the right upper chest and fistula graft from the left upper extremity Hematology: no cervical, inginal, or axillary adenopathy.  Neurological: A&O x3, Strenght is normal and symmetric bilaterally, cranial nerve II-XII are grossly intact, no focal motor deficit, sensory intact to light touch bilaterally.  Skin: Warm, dry and intact. No rash, cyanosis, or clubbing.  Psychiatric: Normal mood and affect. speech and behavior is normal. Judgment and thought content normal. Cognition and memory are normal.      Data Review   Micro Results No results found for this or any previous visit (from the past 240 hour(s)).  Radiology Reports Dg Chest 2 View  04/18/2015  CLINICAL DATA:  Cough, shortness of breath. EXAM: CHEST  2 VIEW COMPARISON:  April 10, 2015. FINDINGS: Stable cardiomegaly. No pneumothorax or pleural effusion is noted. Stable compression fracture of lower thoracic spine. Right internal jugular dialysis catheter is unchanged, with distal  tip in right atrium. Stable mild central pulmonary vascular congestion is noted. No acute pulmonary disease is noted. IMPRESSION: Stable cardiomegaly and mild central pulmonary vascular congestion. Electronically Signed   By: JMarijo Conception M.D.   On: 04/18/2015 19:17     CBC  Recent Labs Lab 04/18/15 1841  WBC 7.1  HGB 8.5*  HCT 27.3*  PLT 159  MCV 98.9  MCH 30.8  MCHC 31.1  RDW 17.3*  LYMPHSABS 1.4  MONOABS 1.3*  EOSABS 0.1  BASOSABS 0.1    Chemistries   Recent Labs Lab 04/18/15 1841  NA 138  K 4.0  CL 99*  CO2 28  GLUCOSE 126*  BUN 14  CREATININE 4.81*  CALCIUM 9.0  AST 36  ALT 25  ALKPHOS 78  BILITOT 1.1   ------------------------------------------------------------------------------------------------------------------ estimated creatinine clearance is 9.3 mL/min (by C-G formula based on Cr of 4.81). ------------------------------------------------------------------------------------------------------------------ No results for input(s): HGBA1C in the last 72 hours. ------------------------------------------------------------------------------------------------------------------ No results for input(s): CHOL, HDL, LDLCALC, TRIG, CHOLHDL, LDLDIRECT in the last 72 hours. ------------------------------------------------------------------------------------------------------------------ No results for input(s): TSH, T4TOTAL, T3FREE, THYROIDAB in the last 72 hours.  Invalid input(s): FREET3 ------------------------------------------------------------------------------------------------------------------ No results for input(s): VITAMINB12, FOLATE, FERRITIN, TIBC, IRON, RETICCTPCT in the last 72 hours.  Coagulation profile No results for input(s): INR, PROTIME in the last 168 hours.  No results for input(s): DDIMER in the last 72 hours.  Cardiac Enzymes No results for input(s): CKMB, TROPONINI, MYOGLOBIN in the last 168 hours.  Invalid input(s):  CK ------------------------------------------------------------------------------------------------------------------ Invalid input(s): POCBNP   CBG: No results for input(s): GLUCAP in the last 168 hours.     EKG: Independently reviewed. Sinus with a new right bundle branch block/left anterior fascicular block looking at previous EKGs question if this is actually new or just similar to previous readings  Assessment/Plan Principal Problem:    Atypical chest pain: This appears to be a acute on chronic issue that although does not appear to be purely cardiac in nature.  Patient presented with a small bump in troponins which could either be due to demand ischemia due to respiratory complaints. - Admit to telemetry bed - Records from [facility]    Elevated troponin:  I stat troponin mildly elevated at 0.15 , but it is unknown what her readings were during her [place] hospitalization - troponin I x 3  q6 hr  End-stage renal disease on HD: Patient was just dialyzed on today, but ED physician report some issue with pulling fluid off. Mild pulmonary congestion seen with chest x-ray, but overall stable cardiomegaly. -May benefit from nephrology consult in a.m.  Chronic cough: Coughing appears to provoke or worsen chest pain symptoms per history - Try increased dose of Tessalon Perles change from 100 mg to 200 mg  - Trial of Hycodan syrup with a reported allergy to morphine patient gives history that appears like she was over sedated from this medicine.  Paroxysmal atrial fibrillation with history of diastolic congestive heart failure last echocardiogram done at [facility] [facility] showed a LVH EF 45% abnormal diastolic function RV systolic pressure is 51 mmHg. : Patient appears to be in sinus rhythm at this time. She is supposed to be on chronic anticoagulation with a goal INR of 2-3 previously was on warfarin had a supratherapeutic INR of greater than 8 on 04/07/2015. Patient was advised  to stop Coumadin admitted to the hospital at [facility] and this was never restarted. Her last cardiology note it appears to she was supposed to be taking Coumadin. - Warfarin per pharmacy - Continue metoprolol,? amiodarone - Would follow-up with Cardiology patient sees Dr. regarding ?amiodarone lung toxic and complaints of shortness of breath as well as warfarin with continuing dropping H&H  Suspected  Anemia of chronic disease  - continue to monitor recheck CBCin am   - Question underlying bleeding check a fecal occult blood   Costochondritis: Chest wall pain underneath left breast. Suspect secondary to recent history of coughing, but with a history of multiple myeloma as well  -  will check a rib series a short chest pain complaints not coming from bony lesions   Essential hypertension -Continue Norvasc 10 mg daily on days  without dialysis and 5 mg days of dialysis per her last cardiology visit note    Multiple myeloma ([place])  -Patient is currently receiving treatments with oncology Dr.Darvillsky      GERD without esophagitis -Continue Protonix  Code Status:   full Family Communication: bedside Disposition Plan: admit   Total time spent 55 minutes.Greater than 50% of this time was spent in counseling, explanation of diagnosis, planning of further management, and coordination of care  [facility] Pager [phone]  If 7PM-7AM, please contact night-coverage [email or web address] Password [email or web address] 04/18/2015, 9:35 PM

## 2015-04-18 NOTE — ED Notes (Signed)
Report attempted, RN to call back. 

## 2015-04-19 ENCOUNTER — Observation Stay (HOSPITAL_COMMUNITY): Payer: Medicare Other

## 2015-04-19 DIAGNOSIS — E669 Obesity, unspecified: Secondary | ICD-10-CM | POA: Diagnosis present

## 2015-04-19 DIAGNOSIS — C9001 Multiple myeloma in remission: Secondary | ICD-10-CM | POA: Diagnosis not present

## 2015-04-19 DIAGNOSIS — I480 Paroxysmal atrial fibrillation: Secondary | ICD-10-CM

## 2015-04-19 DIAGNOSIS — Z8249 Family history of ischemic heart disease and other diseases of the circulatory system: Secondary | ICD-10-CM | POA: Diagnosis not present

## 2015-04-19 DIAGNOSIS — Z888 Allergy status to other drugs, medicaments and biological substances status: Secondary | ICD-10-CM | POA: Diagnosis not present

## 2015-04-19 DIAGNOSIS — N186 End stage renal disease: Secondary | ICD-10-CM | POA: Diagnosis present

## 2015-04-19 DIAGNOSIS — I10 Essential (primary) hypertension: Secondary | ICD-10-CM | POA: Diagnosis not present

## 2015-04-19 DIAGNOSIS — I132 Hypertensive heart and chronic kidney disease with heart failure and with stage 5 chronic kidney disease, or end stage renal disease: Secondary | ICD-10-CM | POA: Diagnosis present

## 2015-04-19 DIAGNOSIS — R0789 Other chest pain: Secondary | ICD-10-CM

## 2015-04-19 DIAGNOSIS — Z7982 Long term (current) use of aspirin: Secondary | ICD-10-CM | POA: Diagnosis not present

## 2015-04-19 DIAGNOSIS — R05 Cough: Secondary | ICD-10-CM | POA: Diagnosis not present

## 2015-04-19 DIAGNOSIS — I2510 Atherosclerotic heart disease of native coronary artery without angina pectoris: Secondary | ICD-10-CM | POA: Diagnosis present

## 2015-04-19 DIAGNOSIS — I422 Other hypertrophic cardiomyopathy: Secondary | ICD-10-CM | POA: Diagnosis present

## 2015-04-19 DIAGNOSIS — I5032 Chronic diastolic (congestive) heart failure: Secondary | ICD-10-CM

## 2015-04-19 DIAGNOSIS — Z87891 Personal history of nicotine dependence: Secondary | ICD-10-CM | POA: Diagnosis not present

## 2015-04-19 DIAGNOSIS — Z7901 Long term (current) use of anticoagulants: Secondary | ICD-10-CM | POA: Diagnosis not present

## 2015-04-19 DIAGNOSIS — M940 Chondrocostal junction syndrome [Tietze]: Secondary | ICD-10-CM | POA: Diagnosis present

## 2015-04-19 DIAGNOSIS — Z885 Allergy status to narcotic agent status: Secondary | ICD-10-CM | POA: Diagnosis not present

## 2015-04-19 DIAGNOSIS — I452 Bifascicular block: Secondary | ICD-10-CM | POA: Diagnosis present

## 2015-04-19 DIAGNOSIS — C9000 Multiple myeloma not having achieved remission: Secondary | ICD-10-CM | POA: Diagnosis present

## 2015-04-19 DIAGNOSIS — E785 Hyperlipidemia, unspecified: Secondary | ICD-10-CM | POA: Diagnosis present

## 2015-04-19 DIAGNOSIS — Z992 Dependence on renal dialysis: Secondary | ICD-10-CM | POA: Diagnosis not present

## 2015-04-19 DIAGNOSIS — J449 Chronic obstructive pulmonary disease, unspecified: Secondary | ICD-10-CM | POA: Diagnosis present

## 2015-04-19 DIAGNOSIS — Z6827 Body mass index (BMI) 27.0-27.9, adult: Secondary | ICD-10-CM | POA: Diagnosis not present

## 2015-04-19 DIAGNOSIS — Z955 Presence of coronary angioplasty implant and graft: Secondary | ICD-10-CM | POA: Diagnosis not present

## 2015-04-19 DIAGNOSIS — Z79899 Other long term (current) drug therapy: Secondary | ICD-10-CM | POA: Diagnosis not present

## 2015-04-19 DIAGNOSIS — Z90710 Acquired absence of both cervix and uterus: Secondary | ICD-10-CM | POA: Diagnosis not present

## 2015-04-19 DIAGNOSIS — F329 Major depressive disorder, single episode, unspecified: Secondary | ICD-10-CM | POA: Diagnosis present

## 2015-04-19 DIAGNOSIS — D631 Anemia in chronic kidney disease: Secondary | ICD-10-CM | POA: Diagnosis present

## 2015-04-19 DIAGNOSIS — K219 Gastro-esophageal reflux disease without esophagitis: Secondary | ICD-10-CM | POA: Diagnosis present

## 2015-04-19 DIAGNOSIS — Z807 Family history of other malignant neoplasms of lymphoid, hematopoietic and related tissues: Secondary | ICD-10-CM | POA: Diagnosis not present

## 2015-04-19 DIAGNOSIS — R748 Abnormal levels of other serum enzymes: Secondary | ICD-10-CM | POA: Diagnosis present

## 2015-04-19 DIAGNOSIS — Z880 Allergy status to penicillin: Secondary | ICD-10-CM | POA: Diagnosis not present

## 2015-04-19 LAB — RENAL FUNCTION PANEL
ANION GAP: 9 (ref 5–15)
Albumin: 2.6 g/dL — ABNORMAL LOW (ref 3.5–5.0)
BUN: 17 mg/dL (ref 6–20)
CALCIUM: 9.1 mg/dL (ref 8.9–10.3)
CHLORIDE: 99 mmol/L — AB (ref 101–111)
CO2: 30 mmol/L (ref 22–32)
CREATININE: 5.8 mg/dL — AB (ref 0.44–1.00)
GFR, EST AFRICAN AMERICAN: 7 mL/min — AB (ref >60)
GFR, EST NON AFRICAN AMERICAN: 6 mL/min — AB (ref >60)
Glucose, Bld: 123 mg/dL — ABNORMAL HIGH (ref 65–99)
Phosphorus: 4.6 mg/dL (ref 2.5–4.6)
Potassium: 4.2 mmol/L (ref 3.5–5.1)
SODIUM: 138 mmol/L (ref 135–145)

## 2015-04-19 LAB — BRAIN NATRIURETIC PEPTIDE: B NATRIURETIC PEPTIDE 5: 1634.1 pg/mL — AB (ref 0.0–100.0)

## 2015-04-19 LAB — CBC WITH DIFFERENTIAL/PLATELET
BASOS PCT: 0 %
Basophils Absolute: 0 10*3/uL (ref 0.0–0.1)
EOS PCT: 4 %
Eosinophils Absolute: 0.2 10*3/uL (ref 0.0–0.7)
HCT: 25.8 % — ABNORMAL LOW (ref 36.0–46.0)
HEMOGLOBIN: 8.1 g/dL — AB (ref 12.0–15.0)
LYMPHS PCT: 16 %
Lymphs Abs: 1 10*3/uL (ref 0.7–4.0)
MCH: 31.3 pg (ref 26.0–34.0)
MCHC: 31.4 g/dL (ref 30.0–36.0)
MCV: 99.6 fL (ref 78.0–100.0)
MONO ABS: 0.6 10*3/uL (ref 0.1–1.0)
Monocytes Relative: 10 %
NEUTROS PCT: 70 %
Neutro Abs: 4.4 10*3/uL (ref 1.7–7.7)
PLATELETS: 138 10*3/uL — AB (ref 150–400)
RBC: 2.59 MIL/uL — AB (ref 3.87–5.11)
RDW: 17.3 % — ABNORMAL HIGH (ref 11.5–15.5)
WBC: 6.2 10*3/uL (ref 4.0–10.5)

## 2015-04-19 LAB — PROTIME-INR
INR: 1.12 (ref 0.00–1.49)
Prothrombin Time: 14.6 seconds (ref 11.6–15.2)

## 2015-04-19 LAB — TROPONIN I
TROPONIN I: 0.13 ng/mL — AB (ref <0.031)
TROPONIN I: 0.14 ng/mL — AB (ref <0.031)
Troponin I: 0.13 ng/mL — ABNORMAL HIGH (ref <0.031)

## 2015-04-19 MED ORDER — PANTOPRAZOLE SODIUM 40 MG PO TBEC
40.0000 mg | DELAYED_RELEASE_TABLET | Freq: Every day | ORAL | Status: DC
  Administered 2015-04-19 – 2015-04-21 (×2): 40 mg via ORAL
  Filled 2015-04-19 (×2): qty 1

## 2015-04-19 MED ORDER — WARFARIN SODIUM 2.5 MG PO TABS
2.5000 mg | ORAL_TABLET | Freq: Once | ORAL | Status: AC
  Administered 2015-04-19: 2.5 mg via ORAL
  Filled 2015-04-19: qty 1

## 2015-04-19 MED ORDER — BENZONATATE 100 MG PO CAPS
200.0000 mg | ORAL_CAPSULE | Freq: Three times a day (TID) | ORAL | Status: DC | PRN
  Administered 2015-04-19 – 2015-04-20 (×4): 200 mg via ORAL
  Filled 2015-04-19 (×4): qty 2

## 2015-04-19 MED ORDER — AMLODIPINE BESYLATE 10 MG PO TABS
10.0000 mg | ORAL_TABLET | Freq: Every day | ORAL | Status: DC
  Administered 2015-04-19 – 2015-04-21 (×3): 10 mg via ORAL
  Filled 2015-04-19 (×3): qty 1

## 2015-04-19 MED ORDER — AMIODARONE HCL 200 MG PO TABS
200.0000 mg | ORAL_TABLET | Freq: Every day | ORAL | Status: DC
  Administered 2015-04-19 – 2015-04-21 (×3): 200 mg via ORAL
  Filled 2015-04-19 (×3): qty 1

## 2015-04-19 MED ORDER — GI COCKTAIL ~~LOC~~
30.0000 mL | Freq: Four times a day (QID) | ORAL | Status: DC | PRN
Start: 2015-04-19 — End: 2015-04-21

## 2015-04-19 MED ORDER — NEPRO/CARBSTEADY PO LIQD
237.0000 mL | Freq: Two times a day (BID) | ORAL | Status: DC
  Filled 2015-04-19 (×8): qty 237

## 2015-04-19 MED ORDER — WARFARIN - PHARMACIST DOSING INPATIENT
Freq: Every day | Status: DC
  Administered 2015-04-19: 18:00:00

## 2015-04-19 MED ORDER — LIDOCAINE 5 % EX PTCH
1.0000 | MEDICATED_PATCH | CUTANEOUS | Status: DC
  Administered 2015-04-19 – 2015-04-20 (×2): 1 via TRANSDERMAL
  Filled 2015-04-19 (×2): qty 1

## 2015-04-19 MED ORDER — WARFARIN SODIUM 2 MG PO TABS: 2.0000 mg | ORAL_TABLET | Freq: Every day | ORAL | Status: DC

## 2015-04-19 MED ORDER — LATANOPROST 0.005 % OP SOLN
1.0000 [drp] | Freq: Every day | OPHTHALMIC | Status: DC
  Administered 2015-04-19 – 2015-04-20 (×3): 1 [drp] via OPHTHALMIC
  Filled 2015-04-19: qty 2.5

## 2015-04-19 MED ORDER — ACETAMINOPHEN 325 MG PO TABS: 650.0000 mg | ORAL_TABLET | ORAL | Status: DC | PRN

## 2015-04-19 MED ORDER — METOPROLOL TARTRATE 12.5 MG HALF TABLET
12.5000 mg | ORAL_TABLET | Freq: Two times a day (BID) | ORAL | Status: DC
  Administered 2015-04-19 – 2015-04-20 (×4): 12.5 mg via ORAL
  Filled 2015-04-19 (×5): qty 1

## 2015-04-19 MED ORDER — HYDROCODONE-HOMATROPINE 5-1.5 MG/5ML PO SYRP: 2.5000 mL | ORAL_SOLUTION | Freq: Four times a day (QID) | ORAL | Status: DC | PRN

## 2015-04-19 MED ORDER — DOXYCYCLINE HYCLATE 100 MG PO TABS
100.0000 mg | ORAL_TABLET | Freq: Two times a day (BID) | ORAL | Status: DC
  Administered 2015-04-19 (×2): 100 mg via ORAL
  Filled 2015-04-19 (×2): qty 1

## 2015-04-19 MED ORDER — ONDANSETRON HCL 4 MG/2ML IJ SOLN: 4.0000 mg | Freq: Four times a day (QID) | INTRAMUSCULAR | Status: DC | PRN

## 2015-04-19 MED ORDER — ASPIRIN EC 81 MG PO TBEC
81.0000 mg | DELAYED_RELEASE_TABLET | Freq: Every day | ORAL | Status: DC
  Administered 2015-04-19 – 2015-04-21 (×3): 81 mg via ORAL
  Filled 2015-04-19 (×3): qty 1

## 2015-04-19 MED ORDER — ATORVASTATIN CALCIUM 80 MG PO TABS
80.0000 mg | ORAL_TABLET | Freq: Every day | ORAL | Status: DC
  Administered 2015-04-19 – 2015-04-20 (×3): 80 mg via ORAL
  Filled 2015-04-19 (×3): qty 1

## 2015-04-19 NOTE — Progress Notes (Signed)
PT Cancellation Note  Patient Details Name: MARSHELL COBLER MRN: [id number] DOB: 12-Dec-1940   Cancelled Treatment:    Reason Eval/Treat Not Completed: PT screened, no needs identified, will sign off.  Patient standing at sink and ambulating around room.  Reports she has had no balance issues and no recent falls at home.  Patient defers PT evaluation.  Will sign off.     Shanna Cisco 04/19/2015, 1:24 PM

## 2015-04-19 NOTE — Progress Notes (Signed)
Pt had a troponin of 0.13, no s/s, earlier was 0.15, MD notified will continue to monitor, thanks Arvella Nigh RN

## 2015-04-19 NOTE — Care Management Obs Status (Addendum)
[facility] NOTIFICATION   Patient Details  Name: [name] [name] MRN: [id number] Date of Birth: 08-12-1940   Medicare Observation Status Notification Given:  Yes; 04/19/15    Guido Sander, RN 04/19/2015, 4:26 PM

## 2015-04-19 NOTE — Progress Notes (Signed)
ANTICOAGULATION CONSULT NOTE - Initial Consult  Pharmacy Consult for Warfarin  Indication: atrial fibrillation  Allergies  Allergen Reactions  . Morphine Shortness Of Breath and Other (See Comments)    Eyes became bloodshot  . Phenergan [Promethazine Hcl] Other (See Comments)    Restless, loopy  . Penicillins Rash   Patient Measurements: Height: 5\' 2"  (157.5 cm) Weight: 161 lb (73.029 kg) IBW/kg (Calculated) : 50.1  Vital Signs: Temp: 98.4 F (36.9 C) (12/10 1741) Temp Source: Oral (12/10 1741) BP: 152/61 mmHg (12/10 2245) Pulse Rate: 78 (12/10 2245)  Labs:  Recent Labs  04/18/15 1841 04/18/15 2312  HGB 8.5*  --   HCT 27.3*  --   PLT 159  --   LABPROT  --  14.4  INR  --  1.10  CREATININE 4.81*  --     Estimated Creatinine Clearance: 9.3 mL/min (by C-G formula based on Cr of 4.81).   Medical History: Past Medical History  Diagnosis Date  . HTN (hypertension)   . Hyperlipidemia   . Hypertrophic cardiomyopathy (HCC)     Mild hypertrophic CM without outflow tract obstruction -- ETT/Myoview EF 74%. no ischemia (12/2006)  --stress echo 8/10: normal BP response to exercise. no presyncope  no wall motion abnormalties. No significant LVOT obstruction. non-diagnositc ST depression  --holter monitor normal   . CAD (coronary artery disease)     --s/p DES LAD and POBA diagonal 11/10  --RHC cath normal  . Obesity   . Pre-syncope   . CHF (congestive heart failure) ([place])   . A-fib ([place])   . COPD (chronic obstructive pulmonary disease) ([place])     pt denies this/not aware of this  . Pneumonia   . ESRD (end stage renal disease) on dialysis [facility])     Dialysis Tuesday/Thursday/Saturday  . Depression     since starting dialysis and dx'ed with multiple myeloma  . Cancer ([place])     multiple myeloma  . Anemia   . PONV (postoperative nausea and vomiting)     nausea after bone marrow biopsy   Assessment: 74 y/o F on warfarin PTA for afib, INR is 1.1 on admit, warfarin was HELD  on 11/29 at her outpatient warfarin clinic for POCT INR >8, though I can't tell if this was confirmed by standard lab testing. Warfarin dose was 2 mg on Mondays and 4 mg all other days. Will re-start conservatively as I don't know the reason for supra-therapeutic INR.   Goal of Therapy:  INR 2-3 Monitor platelets by anticoagulation protocol: Yes   Plan:  -Warfarin 2.5 mg PO x 1 tonight -Daily PT/INR -Monitor for bleeding  Narda Bonds 04/19/2015,12:14 AM

## 2015-04-19 NOTE — Progress Notes (Addendum)
[facility] PROGRESS NOTE  [name] [name] WPV:[id number] DOB: 09/28/1940 DOA: 04/18/2015 PCP: [name] Burly, MD  Assessment/Plan: 1. Cough -Patient reporting having cough over the past 3 weeks that is associated with shortness of breath, denies associated fevers, chills.  -Workup performed in the emergency department included a two-view chest x-ray that revealed mild central pulmonary vascular congestion. She had a CT scan of lungs with IV contrast performed earlier the see her on 09/29/2014 that revealed findings suggestive of pulmonary edema, no evidence of malignancy.  -Will consult nephrology as she may require removal of additional fluid through HD.  -Cover with Doxy for possible atypical infection.  -As needed antitussive medications.   2.   Left sided chest pain. -Patient presenting with chest pain having atypical features which is reproducible to palpation and deep inspiration. She states pain is precipitated by cough -Suspect chest pain is musculoskeletal in origin -Rib series has been ordered as well to assess for the possibility of bone metastasis as the cause of her pain symptoms.    3.  End-stage renal disease. -She has currently a dialysis patient at [place] in [facility] -Nephrology consulted for HD during this hospitalization. She may need extra fluid removed during HD.   4.  Paroxysmal atrial fibrillation -CHADSVasc score of 4, had been on chronic anticoagulation with warfarin. This was held last week for a supratherapeutic INR.  -Coumadin restarted on ths hospitalization.  -Continue amiodarone 200 mg daily and metoprolol 12.5 mg by mouth twice a day  5.  Hypertrophic cardiomyopathy -Patient presenting with cough, labs showing a BNP of 1634 with a chest x-ray showing interstitial edema. -Will consult nephrology for hemodialysis.  6.  Hypertension. -Continue Norvasc 10 mg by mouth daily and metoprolol 12.5 mg by mouth twice a day  7.  Multiple myeloma -She is  currently under the care of [name] [name] of medical oncology  Code Status: Full code Family Communication: Spoke to her son was present at bedside Disposition Plan:    Consultants:  Nephrology  Antibiotics:  Doxycycline  HPI/Subjective: [name] [name] is a 74 year old female with extensive past medical history including diastolic congestive heart failure, paroxysmal atrial fibrillation on chronic anticoagulation, end-stage renal disease on hemodialysis, multiple myeloma admitted overnight when she presented with complaints of cough associated with shortness of breath and left-sided chest pain. She was recently hospitalized at [facility] for the same symptoms. Chest x-ray performed on admission showing mild central pulmonary vascular congestion, no obvious infiltrates noted.  Objective: Filed Vitals:   04/19/15 0014 04/19/15 0349  BP: 170/68 126/40  Pulse: 82 64  Temp: 97.8 F (36.6 C) 97.8 F (36.6 C)  Resp: 18 18    Intake/Output Summary (Last 24 hours) at 04/19/15 0804 Last data filed at 04/19/15 0750  Gross per 24 hour  Intake    580 ml  Output      0 ml  Net    580 ml   Filed Weights   04/18/15 1838 04/19/15 0014  Weight: 73.029 kg (161 lb) 71.578 kg (157 lb 12.8 oz)    Exam:   General:  Patient is nontoxic-appearing, no acute distress  Cardiovascular: Regular rate and rhythm normal S1-S2  Respiratory: She had a few basilar crackles, otherwise normal respiratory effort, good air movement, no wheezing or rhonchi  Abdomen: Soft nontender nondistended  Musculoskeletal: No edema  Data Reviewed: Basic Metabolic Panel:  Recent Labs Lab 04/18/15 1841 04/19/15 0242  NA 138 138  K 4.0 4.2  CL 99* 99*  CO2 28 30  GLUCOSE 126* 123*  BUN 14 17  CREATININE 4.81* 5.80*  CALCIUM 9.0 9.1  PHOS  --  4.6   Liver Function Tests:  Recent Labs Lab 04/18/15 1841 04/19/15 0242  AST 36  --   ALT 25  --   ALKPHOS 78  --   BILITOT 1.1  --   PROT 6.1*  --    ALBUMIN 2.9* 2.6*   No results for input(s): LIPASE, AMYLASE in the last 168 hours. No results for input(s): AMMONIA in the last 168 hours. CBC:  Recent Labs Lab 04/18/15 1841 04/19/15 0302  WBC 7.1 6.2  NEUTROABS 4.2 4.4  HGB 8.5* 8.1*  HCT 27.3* 25.8*  MCV 98.9 99.6  PLT 159 138*   Cardiac Enzymes:  Recent Labs Lab 04/19/15 0056 04/19/15 0302 04/19/15 0630  TROPONINI 0.13* 0.13* 0.14*   BNP (last 3 results)  Recent Labs  09/28/14 2217 04/19/15 0242  BNP 597.6* 1634.1*    ProBNP (last 3 results) No results for input(s): PROBNP in the last 8760 hours.  CBG: No results for input(s): GLUCAP in the last 168 hours.  No results found for this or any previous visit (from the past 240 hour(s)).   Studies: Dg Chest 2 View  04/18/2015  CLINICAL DATA:  Cough, shortness of breath. EXAM: CHEST  2 VIEW COMPARISON:  April 10, 2015. FINDINGS: Stable cardiomegaly. No pneumothorax or pleural effusion is noted. Stable compression fracture of lower thoracic spine. Right internal jugular dialysis catheter is unchanged, with distal tip in right atrium. Stable mild central pulmonary vascular congestion is noted. No acute pulmonary disease is noted. IMPRESSION: Stable cardiomegaly and mild central pulmonary vascular congestion. Electronically Signed   By: [name] [name], M.D.   On: 04/18/2015 19:17    Scheduled Meds: . amiodarone  200 mg Oral Daily  . amLODipine  10 mg Oral Daily  . aspirin EC  81 mg Oral Daily  . atorvastatin  80 mg Oral QHS  . doxycycline  100 mg Oral Q12H  . feeding supplement (NEPRO CARB STEADY)  237 mL Oral BID BM  . latanoprost  1 drop Both Eyes QHS  . lidocaine  1 patch Transdermal Q24H  . metoprolol tartrate  12.5 mg Oral BID  . pantoprazole  40 mg Oral Daily  . Warfarin - Pharmacist Dosing Inpatient   Does not apply q1800   Continuous Infusions:   Principal Problem:   Atypical chest pain Active Problems:   Anemia   Chronic diastolic heart  failure (HCC)   Essential hypertension   Paroxysmal atrial fibrillation (HCC)   Multiple myeloma (HCC)   Costochondritis   Chronic cough   GERD without esophagitis   Elevated troponin    Time spent: 35 min    [name] [name], [facility] Pager [phone]. If 7PM-7AM, please contact night-coverage at [email or web address], password [facility] 04/19/2015, 8:04 AM

## 2015-04-19 NOTE — Consult Note (Signed)
Renal Service Consult Note [facility]  [name] [name] 04/19/2015 [place] Requesting Physician:  Dr Coralyn Pear  Reason for Consult:  ESRD patient with  HPI: The patient is a 74 y.o. year-old with hx of ESRD on HD , hypertroph CM, Hl, HTN, afib, COPD, PNA, depression and multiple myeloma.  Hx diast HF. ON chemo Rx for multiple myeloma, just restarted after 6 wk break for fatigue. Fatigue did not improve per ED notes.   Patient was diagnosed with renal failure earlier this year, she started dialysis at [facility] in June 2016.  Had AVF done by Dr Trula Slade left forearm on 02/06/15.  She started chemoRx for myeloma in July this year.  She takes 5 pills every 7 days and one SQ shot every 7 days.  She has afib, CAD w one stent.  Denies any recent fevers, URI symptoms, prod cough. Chest pain is mostly w coughing or moving. Appetite has been very poor, "i'm losing weight".    ROS  no joint pain  no leg edema  no HA  no confusion  no hemoptysis  no abd pain  no diarrhea/ n/v  Past Medical History  Past Medical History  Diagnosis Date  . HTN (hypertension)   . Hyperlipidemia   . Hypertrophic cardiomyopathy (HCC)     Mild hypertrophic CM without outflow tract obstruction -- ETT/Myoview EF 74%. no ischemia (12/2006)  --stress echo 8/10: normal BP response to exercise. no presyncope  no wall motion abnormalties. No significant LVOT obstruction. non-diagnositc ST depression  --holter monitor normal   . CAD (coronary artery disease)     --s/p DES LAD and POBA diagonal 11/10  --RHC cath normal  . Obesity   . Pre-syncope   . CHF (congestive heart failure) ([place])   . A-fib ([place])   . COPD (chronic obstructive pulmonary disease) ([place])     pt denies this/not aware of this  . Pneumonia   . ESRD (end stage renal disease) on dialysis [facility])     Dialysis Tuesday/Thursday/Saturday  . Depression     since starting dialysis and dx'ed with multiple myeloma  . Cancer ([place])     multiple  myeloma  . Anemia   . PONV (postoperative nausea and vomiting)     nausea after bone marrow biopsy   Past Surgical History  Past Surgical History  Procedure Laterality Date  . Coronary angioplasty  2010  . Appendectomy    . Abdominal hysterectomy    . Colonoscopy    . Bone marrow biopsy    . Av fistula placement Left 02/06/2015    Procedure: CREATION OF LEFT ARM  ARTERIOVENOUS (AV) FISTULA ;  Surgeon: Serafina Mitchell, MD;  Location: [facility] OR;  Service: Vascular;  Laterality: Left;   Family History  Family History  Problem Relation Age of Onset  . CAD Mother   . Multiple myeloma Father   . Diabetes type II Sister   . CAD Brother   . Stroke Sister   . Uterine cancer Sister    Social History  reports that she quit smoking about 14 years ago. Her smoking use included Cigarettes. She started smoking about 46 years ago. She has a 8 pack-year smoking history. She has never used smokeless tobacco. She reports that she does not drink alcohol or use illicit drugs. Allergies  Allergies  Allergen Reactions  . Morphine Shortness Of Breath and Other (See Comments)    Eyes became bloodshot  . Phenergan [Promethazine Hcl] Other (See Comments)  Restless, loopy  . Penicillins Rash   Home medications Prior to Admission medications   Medication Sig Start Date End Date Taking? Authorizing Provider  amiodarone (PACERONE) 200 MG tablet Take 200 mg by mouth daily.  11/13/14 11/13/15 Yes Historical Provider, MD  amLODipine (NORVASC) 10 MG tablet Take 1 tablet (10 mg total) by mouth daily. 04/15/15  Yes Imogene Burn, PA-C  aspirin 81 MG EC tablet Take 81 mg by mouth daily.     Yes Historical Provider, MD  atorvastatin (LIPITOR) 80 MG tablet Take 80 mg by mouth at bedtime.    Yes Historical Provider, MD  benzonatate (TESSALON) 100 MG capsule Take 100 mg by mouth 3 (three) times daily as needed for cough.  12/26/14  Yes Historical Provider, MD  denosumab (PROLIA) 60 MG/ML SOLN injection Inject 60 mg into  the skin every 6 (six) months. Administer in upper arm, thigh, or abdomen   Yes Historical Provider, MD  dexamethasone (DECADRON) 4 MG tablet Take 43m by mouth once a week as directed by oncologist 11/21/14  Yes Historical Provider, MD  latanoprost (XALATAN) 0.005 % ophthalmic solution Place 1 drop into both eyes at bedtime.   Yes Historical Provider, MD  metoprolol tartrate (LOPRESSOR) 25 MG tablet Take 0.5 tablets (12.5 mg total) by mouth 2 (two) times daily. 01/01/15  Yes SHerminio Commons MD  pantoprazole (PROTONIX) 40 MG tablet Take 1 tablet (40 mg total) by mouth daily. 10/10/14  Yes PDomenic Polite MD  traMADol (ULTRAM) 50 MG tablet Take 1 tablet (50 mg total) by mouth every 6 (six) hours as needed. 02/06/15   KAlvia Grove PA-C  warfarin (COUMADIN) 4 MG tablet Take 2-4 mg by mouth daily at 6 PM.  11/13/14 11/13/15  Historical Provider, MD   Liver Function Tests  Recent Labs Lab 04/18/15 1841 04/19/15 0242  AST 36  --   ALT 25  --   ALKPHOS 78  --   BILITOT 1.1  --   PROT 6.1*  --   ALBUMIN 2.9* 2.6*   No results for input(s): LIPASE, AMYLASE in the last 168 hours. CBC  Recent Labs Lab 04/18/15 1841 04/19/15 0302  WBC 7.1 6.2  NEUTROABS 4.2 4.4  HGB 8.5* 8.1*  HCT 27.3* 25.8*  MCV 98.9 99.6  PLT 159 1476   Basic Metabolic Panel  Recent Labs Lab 04/18/15 1841 04/19/15 0242  NA 138 138  K 4.0 4.2  CL 99* 99*  CO2 28 30  GLUCOSE 126* 123*  BUN 14 17  CREATININE 4.81* 5.80*  CALCIUM 9.0 9.1  PHOS  --  4.6    Filed Vitals:   04/18/15 2245 04/19/15 0014 04/19/15 0349 04/19/15 1230  BP: 152/61 170/68 126/40 145/80  Pulse: 78 82 64 73  Temp:  97.8 F (36.6 C) 97.8 F (36.6 C) 97.7 F (36.5 C)  TempSrc:  Oral Axillary Oral  Resp: _0 Height:  _1  (1.575 m)    Weight:  71.578 kg (157 lb 12.8 oz)    SpO2: 96% 98% 98% 94%   Exam Coughing, no distress, alert, calm No rash, cyanosis or gangrene Sclera anicteric, throat clear +JVD Chest occ  scattered rales at bases, o/w clear bilat RRR soft SEM no RG Abd soft obese ntnd no ascites or hsm GU deferred MS no joint effusion/ deformity Ext no LE or UE edema R IJ cath / LFA AVF +bruit (maturing)  EKG RBBB, NSR, no acute CXR vasc congestion TTS [facility]  [place] started Jun 16  Assessment: 1 Cough/ SOB/ orthopnea - suspect this is likely vol overload from lean body wt loss 2 ESRD TTS [facility] 3 Myeloma 4 Chest pain seems to be related to cough/ movement 5 PAF supposed to be on coumadin; taking MTP 6 HTN on amlodipine, BP's up   Plan - extra HD first shift in am, get vol down, lower dry wt as BP tolerates.   Kelly Splinter MD [employer] pager [phone]    cell [phone] 04/19/2015, 9:11 PM

## 2015-04-20 DIAGNOSIS — N186 End stage renal disease: Secondary | ICD-10-CM

## 2015-04-20 DIAGNOSIS — C9001 Multiple myeloma in remission: Secondary | ICD-10-CM

## 2015-04-20 DIAGNOSIS — Z992 Dependence on renal dialysis: Secondary | ICD-10-CM

## 2015-04-20 LAB — CBC
HEMATOCRIT: 27.2 % — AB (ref 36.0–46.0)
HEMOGLOBIN: 8.1 g/dL — AB (ref 12.0–15.0)
MCH: 29.9 pg (ref 26.0–34.0)
MCHC: 29.8 g/dL — ABNORMAL LOW (ref 30.0–36.0)
MCV: 100.4 fL — AB (ref 78.0–100.0)
PLATELETS: 159 10*3/uL (ref 150–400)
RBC: 2.71 MIL/uL — AB (ref 3.87–5.11)
RDW: 17.3 % — ABNORMAL HIGH (ref 11.5–15.5)
WBC: 6.9 10*3/uL (ref 4.0–10.5)

## 2015-04-20 LAB — BASIC METABOLIC PANEL
ANION GAP: 12 (ref 5–15)
BUN: 33 mg/dL — ABNORMAL HIGH (ref 6–20)
CHLORIDE: 98 mmol/L — AB (ref 101–111)
CO2: 26 mmol/L (ref 22–32)
Calcium: 9.8 mg/dL (ref 8.9–10.3)
Creatinine, Ser: 8.36 mg/dL — ABNORMAL HIGH (ref 0.44–1.00)
GFR calc non Af Amer: 4 mL/min — ABNORMAL LOW (ref >60)
GFR, EST AFRICAN AMERICAN: 5 mL/min — AB (ref >60)
Glucose, Bld: 103 mg/dL — ABNORMAL HIGH (ref 65–99)
POTASSIUM: 4.7 mmol/L (ref 3.5–5.1)
SODIUM: 136 mmol/L (ref 135–145)

## 2015-04-20 LAB — PROTIME-INR
INR: 1.18 (ref 0.00–1.49)
PROTHROMBIN TIME: 15.2 s (ref 11.6–15.2)

## 2015-04-20 MED ORDER — WARFARIN SODIUM 2 MG PO TABS
4.0000 mg | ORAL_TABLET | Freq: Once | ORAL | Status: AC
  Administered 2015-04-20: 4 mg via ORAL
  Filled 2015-04-20: qty 2

## 2015-04-20 MED ORDER — HEPARIN SODIUM (PORCINE) 1000 UNIT/ML DIALYSIS: 1000.0000 [IU] | INTRAMUSCULAR | Status: DC | PRN

## 2015-04-20 MED ORDER — ALTEPLASE 2 MG IJ SOLR: 2.0000 mg | Freq: Once | INTRAMUSCULAR | Status: DC | PRN

## 2015-04-20 MED ORDER — PENTAFLUOROPROP-TETRAFLUOROETH EX AERO: 1.0000 "application " | INHALATION_SPRAY | CUTANEOUS | Status: DC | PRN

## 2015-04-20 MED ORDER — SODIUM CHLORIDE 0.9 % IV SOLN: 100.0000 mL | INTRAVENOUS | Status: DC | PRN

## 2015-04-20 MED ORDER — HEPARIN SODIUM (PORCINE) 1000 UNIT/ML DIALYSIS: 2000.0000 [IU] | INTRAMUSCULAR | Status: DC | PRN

## 2015-04-20 MED ORDER — LIDOCAINE 5 % EX PTCH: 1.0000 | MEDICATED_PATCH | CUTANEOUS | Status: DC

## 2015-04-20 MED ORDER — LIDOCAINE-PRILOCAINE 2.5-2.5 % EX CREA: 1.0000 "application " | TOPICAL_CREAM | CUTANEOUS | Status: DC | PRN

## 2015-04-20 MED ORDER — LIDOCAINE HCL (PF) 1 % IJ SOLN: 5.0000 mL | INTRAMUSCULAR | Status: DC | PRN

## 2015-04-20 NOTE — Progress Notes (Signed)
Patient and son refused bed alarm. Informed both of the importance of bed alarm for safety purposes and to prevent falls, patient continued to refuse. Urged patient to call for assistance when getting OOB. Will continue to round and offer bathroom assistance when rounding.  [name] [name]

## 2015-04-20 NOTE — Progress Notes (Signed)
[name] for Warfarin  Indication: atrial fibrillation  Allergies  Allergen Reactions  . Morphine Shortness Of Breath and Other (See Comments)    Eyes became bloodshot  . Phenergan [Promethazine Hcl] Other (See Comments)    Restless, loopy  . Penicillins Rash   Patient Measurements: Height: 5\' 2"  (157.5 cm) Weight: 151 lb 0.2 oz (68.5 kg) IBW/kg (Calculated) : 50.1  Vital Signs: Temp: 97.7 [name] (36.5 C) (12/12 1148) Temp Source: Oral (12/12 1148) BP: 128/56 mmHg (12/12 1148) Pulse Rate: 74 (12/12 1148)  Labs:  Recent Labs  04/18/15 1841 04/18/15 2312 04/19/15 0056 04/19/15 0242 04/19/15 0302 04/19/15 0630 04/20/15 0411  HGB 8.5*  --   --   --  8.1*  --  8.1*  HCT 27.3*  --   --   --  25.8*  --  27.2*  PLT 159  --   --   --  138*  --  159  LABPROT  --  14.4  --   --  14.6  --  15.2  INR  --  1.10  --   --  1.12  --  1.18  CREATININE 4.81*  --   --  5.80*  --   --  8.36*  TROPONINI  --   --  0.13*  --  0.13* 0.14*  --     Estimated Creatinine Clearance: 5.2 mL/min (by C-G formula based on Cr of 8.36).  Assessment: [name] [name] on warfarin PTA for afib, INR is 1.1 on admit. Warfarin was HELD on 11/29 at her outpatient warfarin clinic for POCT INR >8, not restarted after that value. Unsure what caused elevated INR.  Warfarin dose was 2 mg on Mondays and 4 mg all other days.  INR currently 1.18.  Was started on doxycycline yesterday which can potentiate INR.  Hgb 8.1, plts 159- no bleeding noted.  Goal of Therapy:  INR 2-3 Monitor platelets by anticoagulation protocol: Yes   Plan:  -Warfarin 4mg  PO x 1 tonight -Daily PT/INR -Monitor for bleeding  Sharvil Hoey D. Denicia Pagliarulo, PharmD, BCPS Clinical Pharmacist Pager: [phone] 04/20/2015 1:44 PM

## 2015-04-20 NOTE — Procedures (Signed)
Patient was seen on dialysis and the procedure was supervised. BFR 400 [address] RIJ TDC BP is 122/50.  Patient appears to be tolerating treatment well.  Extra treatment today to help with respiratory status and will challenge edw.

## 2015-04-20 NOTE — Progress Notes (Signed)
[facility] PROGRESS NOTE  [name]:[id number] DOB: 1940/12/09 DOA: 04/18/2015 PCP: Neale Burly, MD  Assessment/Plan: 1. Cough -Patient reporting having cough over the past 3 weeks that is associated with shortness of breath, denies associated fevers, chills.  -Workup performed in the emergency department included a two-view chest x-ray that revealed mild central pulmonary vascular congestion. She had a CT scan of lungs with IV contrast performed earlier the see her on 09/29/2014 that revealed findings suggestive of pulmonary edema, no evidence of malignancy.  -Will consult nephrology as she may require removal of additional fluid through HD.  -04/20/2015: Will d/c doxy I think symptoms likely related to fluid overload. She reported clinical improvement after undergoing HD today. She remains afebrile, nontoxic, nonfocal.   2.   Left sided chest pain. -Patient presenting with chest pain having atypical features which is reproducible to palpation and deep inspiration. She states pain is precipitated by cough -Suspect chest pain is musculoskeletal in origin -Rib series did not show evidence of lytic disease involving ribs.    3.  End-stage renal disease. -She has currently a dialysis patient at [place] in [facility] -Nephrology consulted for HD during this hospitalization.   4.  Paroxysmal atrial fibrillation -CHADSVasc score of 4, had been on chronic anticoagulation with warfarin. This was held last week for a supratherapeutic INR.  -Coumadin restarted on ths hospitalization.  -Continue amiodarone 200 mg daily and metoprolol 12.5 mg by mouth twice a day  5.  Hypertrophic cardiomyopathy -Patient presenting with cough, labs showing a BNP of 1634 with a chest x-ray showing interstitial edema. -Undergoing HD  6.  Hypertension. -Continue Norvasc 10 mg by mouth daily and metoprolol 12.5 mg by mouth twice a day  7.  Multiple myeloma -She is currently under the care of Dr.  Whitney Muse of medical oncology  Code Status: Full code Family Communication: Spoke to her son was present at bedside Disposition Plan:    Consultants:  Nephrology  Antibiotics:  Doxycycline stopped on 04/20/2015  HPI/Subjective: [name] [name] is a 74 year old female with extensive past medical history including diastolic congestive heart failure, paroxysmal atrial fibrillation on chronic anticoagulation, end-stage renal disease on hemodialysis, multiple myeloma admitted overnight when she presented with complaints of cough associated with shortness of breath and left-sided chest pain. She was recently hospitalized at [facility] for the same symptoms. Chest x-ray performed on admission showing mild central pulmonary vascular congestion, no obvious infiltrates noted.  Objective: Filed Vitals:   04/20/15 1148 04/20/15 1359  BP: 128/56 121/41  Pulse: 74 80  Temp: 97.7 F (36.5 C) 98.4 F (36.9 C)  Resp: 17 18    Intake/Output Summary (Last 24 hours) at 04/20/15 1549 Last data filed at 04/20/15 1309  Gross per 24 hour  Intake    220 ml  Output   4000 ml  Net  -3780 ml   Filed Weights   04/19/15 0014 04/20/15 0408 04/20/15 1148  Weight: 71.578 kg (157 lb 12.8 oz) 69.763 kg (153 lb 12.8 oz) 68.5 kg (151 lb 0.2 oz)    Exam:   General:  Patient is nontoxic-appearing, no acute distress  Cardiovascular: Regular rate and rhythm normal S1-S2  Respiratory: Improved lung exam, resolution of crackles.  Abdomen: Soft nontender nondistended  Musculoskeletal: No edema  Data Reviewed: Basic Metabolic Panel:  Recent Labs Lab 04/18/15 1841 04/19/15 0242 04/20/15 0411  NA 138 138 136  K 4.0 4.2 4.7  CL 99* 99* 98*  CO2 $Re'28 30 26  'yHq$ GLUCOSE  126* 123* 103*  BUN 14 17 33*  CREATININE 4.81* 5.80* 8.36*  CALCIUM 9.0 9.1 9.8  PHOS  --  4.6  --    Liver Function Tests:  Recent Labs Lab 04/18/15 1841 04/19/15 0242  AST 36  --   ALT 25  --   ALKPHOS 78  --   BILITOT 1.1  --    PROT 6.1*  --   ALBUMIN 2.9* 2.6*   No results for input(s): LIPASE, AMYLASE in the last 168 hours. No results for input(s): AMMONIA in the last 168 hours. CBC:  Recent Labs Lab 04/18/15 1841 04/19/15 0302 04/20/15 0411  WBC 7.1 6.2 6.9  NEUTROABS 4.2 4.4  --   HGB 8.5* 8.1* 8.1*  HCT 27.3* 25.8* 27.2*  MCV 98.9 99.6 100.4*  PLT 159 138* 159   Cardiac Enzymes:  Recent Labs Lab 04/19/15 0056 04/19/15 0302 04/19/15 0630  TROPONINI 0.13* 0.13* 0.14*   BNP (last 3 results)  Recent Labs  09/28/14 2217 04/19/15 0242  BNP 597.6* 1634.1*    ProBNP (last 3 results) No results for input(s): PROBNP in the last 8760 hours.  CBG: No results for input(s): GLUCAP in the last 168 hours.  No results found for this or any previous visit (from the past 240 hour(s)).   Studies: Dg Chest 2 View  04/18/2015  CLINICAL DATA:  Cough, shortness of breath. EXAM: CHEST  2 VIEW COMPARISON:  April 10, 2015. FINDINGS: Stable cardiomegaly. No pneumothorax or pleural effusion is noted. Stable compression fracture of lower thoracic spine. Right internal jugular dialysis catheter is unchanged, with distal tip in right atrium. Stable mild central pulmonary vascular congestion is noted. No acute pulmonary disease is noted. IMPRESSION: Stable cardiomegaly and mild central pulmonary vascular congestion. Electronically Signed   By: Marijo Conception, M.D.   On: 04/18/2015 19:17   Dg Ribs Bilateral  04/19/2015  CLINICAL DATA:  Cough for 2 weeks EXAM: BILATERAL RIBS - 3+ VIEW COMPARISON:  04/18/2015 FINDINGS: Cardiac shadow is stable. A dialysis catheter is again noted. No focal infiltrate is noted. No acute rib fracture is seen. IMPRESSION: No acute abnormality noted. Electronically Signed   By: Inez Catalina M.D.   On: 04/19/2015 12:39    Scheduled Meds: . amiodarone  200 mg Oral Daily  . amLODipine  10 mg Oral Daily  . aspirin EC  81 mg Oral Daily  . atorvastatin  80 mg Oral QHS  . doxycycline   100 mg Oral Q12H  . feeding supplement (NEPRO CARB STEADY)  237 mL Oral BID BM  . latanoprost  1 drop Both Eyes QHS  . lidocaine  1 patch Transdermal Q24H  . metoprolol tartrate  12.5 mg Oral BID  . pantoprazole  40 mg Oral Daily  . warfarin  4 mg Oral ONCE-1800  . Warfarin - Pharmacist Dosing Inpatient   Does not apply q1800   Continuous Infusions:   Principal Problem:   Atypical chest pain Active Problems:   Chest pain   Anemia   Chronic diastolic heart failure (HCC)   Essential hypertension   Paroxysmal atrial fibrillation (HCC)   Multiple myeloma (HCC)   Costochondritis   Chronic cough   GERD without esophagitis   Elevated troponin    Time spent: 25 min    Kelvin Cellar  [facility] Pager [phone]. If 7PM-7AM, please contact night-coverage at [email or web address], password [facility] 04/20/2015, 3:49 PM  LOS: 1 day

## 2015-04-20 NOTE — Discharge Instructions (Signed)

## 2015-04-21 LAB — CBC
HCT: 29 % — ABNORMAL LOW (ref 36.0–46.0)
HCT: 29.2 % — ABNORMAL LOW (ref 36.0–46.0)
HEMOGLOBIN: 8.8 g/dL — AB (ref 12.0–15.0)
Hemoglobin: 8.8 g/dL — ABNORMAL LOW (ref 12.0–15.0)
MCH: 30.8 pg (ref 26.0–34.0)
MCH: 30.2 pg (ref 26.0–34.0)
MCHC: 30.3 g/dL (ref 30.0–36.0)
MCHC: 30.1 g/dL (ref 30.0–36.0)
MCV: 101.4 fL — AB (ref 78.0–100.0)
MCV: 100.3 fL — AB (ref 78.0–100.0)
Platelets: 206 10*3/uL (ref 150–400)
Platelets: 227 10*3/uL (ref 150–400)
RBC: 2.86 MIL/uL — AB (ref 3.87–5.11)
RBC: 2.91 MIL/uL — AB (ref 3.87–5.11)
RDW: 17.4 % — ABNORMAL HIGH (ref 11.5–15.5)
RDW: 17.3 % — ABNORMAL HIGH (ref 11.5–15.5)
WBC: 9.9 10*3/uL (ref 4.0–10.5)
WBC: 12 10*3/uL — AB (ref 4.0–10.5)

## 2015-04-21 LAB — RENAL FUNCTION PANEL
ANION GAP: 11 (ref 5–15)
Albumin: 2.9 g/dL — ABNORMAL LOW (ref 3.5–5.0)
BUN: 27 mg/dL — ABNORMAL HIGH (ref 6–20)
CALCIUM: 10.6 mg/dL — AB (ref 8.9–10.3)
CHLORIDE: 100 mmol/L — AB (ref 101–111)
CO2: 26 mmol/L (ref 22–32)
Creatinine, Ser: 7.5 mg/dL — ABNORMAL HIGH (ref 0.44–1.00)
GFR, EST AFRICAN AMERICAN: 5 mL/min — AB (ref >60)
GFR, EST NON AFRICAN AMERICAN: 5 mL/min — AB (ref >60)
Glucose, Bld: 115 mg/dL — ABNORMAL HIGH (ref 65–99)
Phosphorus: 6.6 mg/dL — ABNORMAL HIGH (ref 2.5–4.6)
Potassium: 4.3 mmol/L (ref 3.5–5.1)
Sodium: 137 mmol/L (ref 135–145)

## 2015-04-21 LAB — HEPATITIS B SURFACE ANTIGEN: HEP B S AG: NEGATIVE

## 2015-04-21 LAB — PROTIME-INR
INR: 1.14 (ref 0.00–1.49)
PROTHROMBIN TIME: 14.8 s (ref 11.6–15.2)

## 2015-04-21 MED ORDER — WARFARIN SODIUM 5 MG PO TABS: 5.0000 mg | ORAL_TABLET | Freq: Once | ORAL | Status: DC

## 2015-04-21 MED ORDER — HEPARIN SODIUM (PORCINE) 1000 UNIT/ML DIALYSIS: 20.0000 [IU]/kg | INTRAMUSCULAR | Status: DC | PRN

## 2015-04-21 NOTE — Progress Notes (Signed)
[place] for Warfarin  Indication: atrial fibrillation  Allergies  Allergen Reactions  . Morphine Shortness Of Breath and Other (See Comments)    Eyes became bloodshot  . Phenergan [Promethazine Hcl] Other (See Comments)    Restless, loopy  . Penicillins Rash   Patient Measurements: Height: 5\' 2"  (157.5 cm) Weight: 148 lb 6.4 oz (67.314 kg) (a scale) IBW/kg (Calculated) : 50.1  Vital Signs: Temp: 98.2 F (36.8 C) (12/13 0740) Temp Source: Oral (12/13 0740) BP: 121/34 mmHg (12/13 0740) Pulse Rate: 69 (12/13 0740)  Labs:  Recent Labs  04/18/15 1841  04/19/15 0056 04/19/15 0242 04/19/15 0302 04/19/15 0630 04/20/15 0411 04/21/15 0330  HGB 8.5*  --   --   --  8.1*  --  8.1* 8.8*  HCT 27.3*  --   --   --  25.8*  --  27.2* 29.0*  PLT 159  --   --   --  138*  --  159 206  LABPROT  --   < >  --   --  14.6  --  15.2 14.8  INR  --   < >  --   --  1.12  --  1.18 1.14  CREATININE 4.81*  --   --  5.80*  --   --  8.36*  --   TROPONINI  --   --  0.13*  --  0.13* 0.14*  --   --   < > = values in this interval not displayed.  Estimated Creatinine Clearance: 5.2 mL/min (by C-G formula based on Cr of 8.36).  Assessment: 74 y/o F on warfarin PTA for afib, INR is 1.1 on admit. Warfarin was HELD on 11/29 at her outpatient warfarin clinic for POCT INR >8, not restarted after that value. Unsure what caused elevated INR.  Warfarin dose was 2 mg on Mondays and 4 mg all other days.  INR currently 1.14.  Was started on doxycycline 12/11 which can potentiate INR.  Hgb 8.8, plts wnl- no bleeding noted.  Goal of Therapy:  INR 2-3 Monitor platelets by anticoagulation protocol: Yes   Plan:  -Warfarin 5mg  PO x 1 tonight -Daily PT/INR -Monitor for bleeding  Darl Pikes, PharmD Clinical Pharmacist- Resident Pager: [phone]   04/21/2015 9:30 AM

## 2015-04-21 NOTE — Progress Notes (Signed)
Physician Discharge Summary  [name] [name]:[id number] DOB: 1940-09-18 DOA: 04/18/2015  PCP: Neale Burly, MD  Admit date: 04/18/2015 Discharge date: 04/21/2015  Time spent: 35 minutes  Recommendations for Outpatient Follow-up:  1. Please follow up on volume status, she was admitted for volume overload for which she underwent HD with 4L UF 2. She had a weight of 69.3 Kg on 04/21/2015   Discharge Diagnoses:  Principal Problem:   Atypical chest pain Active Problems:   Chest pain   Anemia   Chronic diastolic heart failure (HCC)   Essential hypertension   Paroxysmal atrial fibrillation (HCC)   Multiple myeloma (HCC)   Costochondritis   Chronic cough   GERD without esophagitis   Elevated troponin   Discharge Condition: Stable/Improved  Diet recommendation: Renal Diet  Filed Weights   04/20/15 1148 04/21/15 0555 04/21/15 1212  Weight: 68.5 kg (151 lb 0.2 oz) 67.314 kg (148 lb 6.4 oz) 69.3 kg (152 lb 12.5 oz)    History of present illness:  [name] [name] has a complex cardiovascular history including HCOM, HTN, chronic dCHF,CAD s/p stent LAD in 2010, PAF on amiodarone and Coumadin, multiple myeloma, and ESRD on HD T/TH/sat; who presents with complaints of continued cough, left-sided chest pain , and congestion/shortness of breath. She was just recently specialized last week on 11/29 at [facility] secondary to cough and low hbg of 7.5g/dL. all she was there she was transfused 1 unit of PRBC, given oxygen, received dialysis, and was placed on antibiotics for possible pneumonia or bronchitis( which family states was never confirmed). She had a 2-D echo while in [place] that appears similar to the previous study time in 09/2014. She was discharged home and had a follow-up appointment at the cardiologist office 3 days ago (12/07). Reports that no changes were made to her medication regimen. Office note with Bonnell Public, PA-C states patient should receive extra fluid taken off  from dialysis, should increase Norvasc to 10 mg daily on nondialysis days /5 mg on dialysis days, office note makes it seem like she is on Coumadin, and she was to follow-up with Dr. Bronson Ing in 2-3 weeks. However, she continued to have chest pain, cough, shortness of breath, and presented here to the emergency department of [facility] with her 2 sons after dialysis today. Chest pain located along the inferior border of her left breast has been ongoing for about 2 weeks. She reports is a achy pain that is worsened by coughing or trying to lay on her left side at night. Associated symptoms include shortness of breath related to coughing and chest pain. Cough symptoms had started back in May of 2016. She states that she was initially started on [employer] while at [facility] in May, which had improved her cough. Prior to her last hospitalization at [facility] symptoms had returned. She describes the cough as a dry hacking cough. Worsened with talking. She gets short of breath with little activity and this also worsens cough. Patient notes that she had a supratherapeutic INR >8 on 11/29 prior to being admitted at [facility] for which she was advised by the Coumadin nurse to stop Coumadin. She is on Coumadin for a history of PAF. She reports that during her hospitalization she was given possibly vitamin K because of the supratherapeutic INR, however it seems that Coumadin was never restarted during or at the end of her hospitalization.  Hospital Course:  [name] [name] is a 74 year old female with extensive past medical history including diastolic  congestive heart failure, paroxysmal atrial fibrillation on chronic anticoagulation, end-stage renal disease on hemodialysis, multiple myeloma admitted overnight when she presented with complaints of cough associated with shortness of breath and left-sided chest pain. She was recently hospitalized at [facility] for the same symptoms. Chest x-ray performed on  admission showing mild central pulmonary vascular congestion, no obvious infiltrates noted.   Cough -Patient reporting having cough over the past 3 weeks that is associated with shortness of breath, denies associated fevers, chills.  -Workup performed in the emergency department included a two-view chest x-ray that revealed mild central pulmonary vascular congestion. She had a CT scan of lungs with IV contrast performed earlier the see her on 09/29/2014 that revealed findings suggestive of pulmonary edema, no evidence of malignancy.  -She had significant improvement to symptoms with HD 4L UF -She had a weight of 69 Kg on day of discharge  2. Left sided chest pain. -Patient presenting with chest pain having atypical features which is reproducible to palpation and deep inspiration. She states pain is precipitated by cough -Suspect chest pain is musculoskeletal in origin -Rib series did not show evidence of lytic disease involving ribs.   3. End-stage renal disease. -She has currently a dialysis patient at [facility] -Nephrology consulted for HD during this hospitalization.  -Symptoms likely related to volume overload.  -Was instructed to resume scheduled HD at [facility] HD  4. Paroxysmal atrial fibrillation -CHADSVasc score of 4, had been on chronic anticoagulation with warfarin. This was held last week for a supratherapeutic INR.  -Coumadin restarted on ths hospitalization.  -Continue amiodarone 200 mg daily and metoprolol 12.5 mg by mouth twice a day  5. Hypertrophic cardiomyopathy -Patient presenting with cough, labs showing a BNP of 1634 with a chest x-ray showing interstitial edema. -Undergoing HD  6. Hypertension. -Continue Norvasc 10 mg by mouth daily and metoprolol 12.5 mg by mouth twice a day  7. Multiple myeloma -She is currently under the care of Dr. Whitney Muse of medical oncology   Procedures:  HD  Consultations:  Nephrology  Discharge Exam: Filed  Vitals:   04/21/15 1400 04/21/15 1430  BP: 111/50 117/52  Pulse: 78 83  Temp:    Resp:       General: Patient is nontoxic-appearing, no acute distress  Cardiovascular: Regular rate and rhythm normal S1-S2  Respiratory: Improved lung exam, resolution of crackles.  Abdomen: Soft nontender nondistended  Musculoskeletal: No edema   Discharge Instructions   Discharge Instructions    Call MD for:  difficulty breathing, headache or visual disturbances    Complete by:  As directed      Call MD for:  extreme fatigue    Complete by:  As directed      Call MD for:  hives    Complete by:  As directed      Call MD for:  persistant dizziness or light-headedness    Complete by:  As directed      Call MD for:  persistant nausea and vomiting    Complete by:  As directed      Call MD for:  redness, tenderness, or signs of infection (pain, swelling, redness, odor or green/yellow discharge around incision site)    Complete by:  As directed      Call MD for:  severe uncontrolled pain    Complete by:  As directed      Call MD for:  temperature >100.4    Complete by:  As directed  Call MD for:    Complete by:  As directed      Diet - low sodium heart healthy    Complete by:  As directed      Increase activity slowly    Complete by:  As directed           Current Discharge Medication List    CONTINUE these medications which have NOT CHANGED   Details  amiodarone (PACERONE) 200 MG tablet Take 200 mg by mouth daily.     amLODipine (NORVASC) 10 MG tablet Take 1 tablet (10 mg total) by mouth daily. Qty: 90 tablet, Refills: 3    aspirin 81 MG EC tablet Take 81 mg by mouth daily.      atorvastatin (LIPITOR) 80 MG tablet Take 80 mg by mouth at bedtime.     benzonatate (TESSALON) 100 MG capsule Take 100 mg by mouth 3 (three) times daily as needed for cough.     denosumab (PROLIA) 60 MG/ML SOLN injection Inject 60 mg into the skin every 6 (six) months. Administer in upper arm,  thigh, or abdomen    dexamethasone (DECADRON) 4 MG tablet Take $RemoveBef'20mg'vhtkoxjRxe$  by mouth once a week as directed by oncologist    latanoprost (XALATAN) 0.005 % ophthalmic solution Place 1 drop into both eyes at bedtime.    metoprolol tartrate (LOPRESSOR) 25 MG tablet Take 0.5 tablets (12.5 mg total) by mouth 2 (two) times daily. Qty: 90 tablet, Refills: 3    pantoprazole (PROTONIX) 40 MG tablet Take 1 tablet (40 mg total) by mouth daily. Qty: 30 tablet, Refills: 0    traMADol (ULTRAM) 50 MG tablet Take 1 tablet (50 mg total) by mouth every 6 (six) hours as needed. Qty: 20 tablet, Refills: 0    warfarin (COUMADIN) 4 MG tablet Take 2-4 mg by mouth daily at 6 PM.        Allergies  Allergen Reactions  . Morphine Shortness Of Breath and Other (See Comments)    Eyes became bloodshot  . Phenergan [Promethazine Hcl] Other (See Comments)    Restless, loopy  . Penicillins Rash   Follow-up Information    Follow up with Neale Burly, MD In 2 weeks.   Specialty:  Internal Medicine   Contact information:   [place] [address] [phone]        The results of significant diagnostics from this hospitalization (including imaging, microbiology, ancillary and laboratory) are listed below for reference.    Significant Diagnostic Studies: Dg Chest 2 View  04/18/2015  CLINICAL DATA:  Cough, shortness of breath. EXAM: CHEST  2 VIEW COMPARISON:  April 10, 2015. FINDINGS: Stable cardiomegaly. No pneumothorax or pleural effusion is noted. Stable compression fracture of lower thoracic spine. Right internal jugular dialysis catheter is unchanged, with distal tip in right atrium. Stable mild central pulmonary vascular congestion is noted. No acute pulmonary disease is noted. IMPRESSION: Stable cardiomegaly and mild central pulmonary vascular congestion. Electronically Signed   By: Marijo Conception, M.D.   On: 04/18/2015 19:17   Dg Ribs Bilateral  04/19/2015  CLINICAL DATA:  Cough for 2  weeks EXAM: BILATERAL RIBS - 3+ VIEW COMPARISON:  04/18/2015 FINDINGS: Cardiac shadow is stable. A dialysis catheter is again noted. No focal infiltrate is noted. No acute rib fracture is seen. IMPRESSION: No acute abnormality noted. Electronically Signed   By: Inez Catalina M.D.   On: 04/19/2015 12:39    Microbiology: No results found for this or any  previous visit (from the past 240 hour(s)).   Labs: Basic Metabolic Panel:  Recent Labs Lab 04/18/15 1841 04/19/15 0242 04/20/15 0411 04/21/15 1225  NA 138 138 136 137  K 4.0 4.2 4.7 4.3  CL 99* 99* 98* 100*  CO2 $Re'28 30 26 26  'MQV$ GLUCOSE 126* 123* 103* 115*  BUN 14 17 33* 27*  CREATININE 4.81* 5.80* 8.36* 7.50*  CALCIUM 9.0 9.1 9.8 10.6*  PHOS  --  4.6  --  6.6*   Liver Function Tests:  Recent Labs Lab 04/18/15 1841 04/19/15 0242 04/21/15 1225  AST 36  --   --   ALT 25  --   --   ALKPHOS 78  --   --   BILITOT 1.1  --   --   PROT 6.1*  --   --   ALBUMIN 2.9* 2.6* 2.9*   No results for input(s): LIPASE, AMYLASE in the last 168 hours. No results for input(s): AMMONIA in the last 168 hours. CBC:  Recent Labs Lab 04/18/15 1841 04/19/15 0302 04/20/15 0411 04/21/15 0330 04/21/15 1225  WBC 7.1 6.2 6.9 9.9 12.0*  NEUTROABS 4.2 4.4  --   --   --   HGB 8.5* 8.1* 8.1* 8.8* 8.8*  HCT 27.3* 25.8* 27.2* 29.0* 29.2*  MCV 98.9 99.6 100.4* 101.4* 100.3*  PLT 159 138* 159 206 227   Cardiac Enzymes:  Recent Labs Lab 04/19/15 0056 04/19/15 0302 04/19/15 0630  TROPONINI 0.13* 0.13* 0.14*   BNP: BNP (last 3 results)  Recent Labs  09/28/14 2217 04/19/15 0242  BNP 597.6* 1634.1*    ProBNP (last 3 results) No results for input(s): PROBNP in the last 8760 hours.  CBG: No results for input(s): GLUCAP in the last 168 hours.     SignedKelvin Cellar  [facility] 04/21/2015, 2:53 PM

## 2015-04-21 NOTE — Procedures (Signed)
Patient was seen on dialysis and the procedure was supervised. BFR 400 Via RIJ TDC BP is 129/48.  Patient appears to be tolerating treatment well.  This is her second consecutive HD treatment to help with ultrafiltation and did well yesterday with 4 l UF.  Will need post weight today to send on to her home HD clinic in [place] so she can have outpt HD on Thursday with new target weight.  Should be ok for discharge to home after HD today.

## 2015-04-29 ENCOUNTER — Encounter: Payer: Self-pay | Admitting: Cardiovascular Disease

## 2015-04-29 ENCOUNTER — Ambulatory Visit: Payer: Medicare Other | Admitting: Cardiovascular Disease

## 2015-04-29 ENCOUNTER — Ambulatory Visit (INDEPENDENT_AMBULATORY_CARE_PROVIDER_SITE_OTHER): Payer: Medicare Other | Admitting: Cardiovascular Disease

## 2015-04-29 VITALS — BP 142/52 | HR 60 | Ht 62.0 in | Wt 154.0 lb

## 2015-04-29 DIAGNOSIS — C9000 Multiple myeloma not having achieved remission: Secondary | ICD-10-CM

## 2015-04-29 DIAGNOSIS — Z87898 Personal history of other specified conditions: Secondary | ICD-10-CM

## 2015-04-29 DIAGNOSIS — I2510 Atherosclerotic heart disease of native coronary artery without angina pectoris: Secondary | ICD-10-CM | POA: Diagnosis not present

## 2015-04-29 DIAGNOSIS — I471 Supraventricular tachycardia: Secondary | ICD-10-CM

## 2015-04-29 DIAGNOSIS — I422 Other hypertrophic cardiomyopathy: Secondary | ICD-10-CM | POA: Diagnosis not present

## 2015-04-29 DIAGNOSIS — I10 Essential (primary) hypertension: Secondary | ICD-10-CM

## 2015-04-29 DIAGNOSIS — N186 End stage renal disease: Secondary | ICD-10-CM

## 2015-04-29 DIAGNOSIS — Z9289 Personal history of other medical treatment: Secondary | ICD-10-CM

## 2015-04-29 DIAGNOSIS — R5383 Other fatigue: Secondary | ICD-10-CM

## 2015-04-29 DIAGNOSIS — I5032 Chronic diastolic (congestive) heart failure: Secondary | ICD-10-CM

## 2015-04-29 DIAGNOSIS — I480 Paroxysmal atrial fibrillation: Secondary | ICD-10-CM | POA: Diagnosis not present

## 2015-04-29 NOTE — Progress Notes (Addendum)
Patient ID: [name] [name], female   DOB: [birth year], 74 y.o.   MRN: [id number]      SUBJECTIVE: The patient is a 74 year old woman who was recently hospitalized for atypical chest pain deemed musculoskeletal in etiology and reproducible with palpation and deep inspiration and likely precipitated by a cough. She had mild central pulmonary vascular congestion with significant improvement in symptoms after hemodialysis. Weight was 69 kg on day of discharge.  In summary, she has a complex cardiovascular history which includes hypertrophic cardiomyopathy, chronic diastolic heart failure, hypertension, coronary artery disease, and ESRD. She also has multiple myeloma and is followed by oncology.  Echocardiogram on 09/29/14 demonstrated vigorous left ventricular systolic function, LVEF 63-01%, grade 2 diastolic dysfunction, moderate mitral stenosis and regurgitation, moderate biatrial dilatation, moderate to severe tricuspid regurgitation, and severely elevated pulmonary pressures, 68 mmHg. LVOT gradient was 28 mmHg at rest and 128 mmHg with Valsalva maneuver.  Echo at [facility] on 04/10/15 showed no significant differences, PASP 51 mmHg.  Nuclear stress testing on 09/30/14 was normal.  It was mentioned in Dr. Elmarie Shiley note on 10/02/14 that the patient should be evaluated by Dr. Burt Knack as an outpatient once renal function improved to assess whether or not she should be a candidate for alcohol septal ablation versus myomectomy if symptoms persisted.  She is here with her son. After being discharged , she has gradually declined over the past week. She had blood testing this morning but these results are currently unavailable. She had incomplete dialysis runs over the past week due to hypotension. She is no longer on amlodipine. She denies chest pain and shortness of breath. She feels markedly fatigued.   Review of Systems: As per "subjective", otherwise negative.  Allergies  Allergen Reactions  .  Morphine Shortness Of Breath and Other (See Comments)    Eyes became bloodshot  . Phenergan [Promethazine Hcl] Other (See Comments)    Restless, loopy  . Penicillins Rash    Current Outpatient Prescriptions  Medication Sig Dispense Refill  . amiodarone (PACERONE) 200 MG tablet Take 200 mg by mouth daily.     Marland Kitchen aspirin 81 MG EC tablet Take 81 mg by mouth daily.      Marland Kitchen atorvastatin (LIPITOR) 80 MG tablet Take 80 mg by mouth at bedtime.     . benzonatate (TESSALON) 100 MG capsule Take 100 mg by mouth 3 (three) times daily as needed for cough.     . denosumab (PROLIA) 60 MG/ML SOLN injection Inject 60 mg into the skin every 6 (six) months. Administer in upper arm, thigh, or abdomen    . dexamethasone (DECADRON) 4 MG tablet Take '20mg'$  by mouth once a week as directed by oncologist    . latanoprost (XALATAN) 0.005 % ophthalmic solution Place 1 drop into both eyes at bedtime.    . metoprolol tartrate (LOPRESSOR) 25 MG tablet Take 25 mg by mouth 2 (two) times daily.    . pantoprazole (PROTONIX) 40 MG tablet Take 1 tablet (40 mg total) by mouth daily. 30 tablet 0  . traMADol (ULTRAM) 50 MG tablet Take 1 tablet (50 mg total) by mouth every 6 (six) hours as needed. 20 tablet 0  . warfarin (COUMADIN) 4 MG tablet Take 2-4 mg by mouth daily at 6 PM.      No current facility-administered medications for this visit.    Past Medical History  Diagnosis Date  . HTN (hypertension)   . Hyperlipidemia   . Hypertrophic cardiomyopathy ([place])  Mild hypertrophic CM without outflow tract obstruction -- ETT/Myoview EF 74%. no ischemia (12/2006)  --stress echo 8/10: normal BP response to exercise. no presyncope  no wall motion abnormalties. No significant LVOT obstruction. non-diagnositc ST depression  --holter monitor normal   . CAD (coronary artery disease)     --s/p DES LAD and POBA diagonal 11/10  --RHC cath normal  . Obesity   . Pre-syncope   . CHF (congestive heart failure) ([place])   . A-fib ([place])   .  COPD (chronic obstructive pulmonary disease) ([place])     pt denies this/not aware of this  . Pneumonia   . ESRD (end stage renal disease) on dialysis [facility])     Dialysis Tuesday/Thursday/Saturday  . Depression     since starting dialysis and dx'ed with multiple myeloma  . Cancer ([place])     multiple myeloma  . Anemia   . PONV (postoperative nausea and vomiting)     nausea after bone marrow biopsy    Past Surgical History  Procedure Laterality Date  . Coronary angioplasty  2010  . Appendectomy    . Abdominal hysterectomy    . Colonoscopy    . Bone marrow biopsy    . Av fistula placement Left 02/06/2015    Procedure: CREATION OF LEFT ARM  ARTERIOVENOUS (AV) FISTULA ;  Surgeon: Serafina Mitchell, MD;  Location: [facility] OR;  Service: Vascular;  Laterality: Left;    Social History   Social History  . Marital Status: Widowed    Spouse Name: N/A  . Number of Children: N/A  . Years of Education: N/A   Occupational History  . Not on file.   Social History Main Topics  . Smoking status: Former Smoker -- 0.25 packs/day for 32 years    Types: Cigarettes    Start date: 07/05/1968    Quit date: 05/09/2000  . Smokeless tobacco: Never Used  . Alcohol Use: No  . Drug Use: No  . Sexual Activity: Not on file   Other Topics Concern  . Not on file   Social History Narrative     Filed Vitals:   04/29/15 1137  BP: 142/52  Pulse: 60  Height: $Remove'5\' 2"'amOespv$  (1.575 m)  Weight: 154 lb (69.854 kg)  SpO2: 90%    PHYSICAL EXAM General: NAD HEENT: Normal. Neck: No JVD, no thyromegaly. Lungs: Clear to auscultation bilaterally with normal respiratory effort. CV: Regular rate and rhythm, normal S1/S2, no T0/G2, 2/6 pansystolic murmur heard throughout precordium. No pretibial or periankle edema. Abdomen: Soft, nontender, no distention.  Neurologic: Alert and oriented.  Psych: Flat affect. Skin: Normal. Musculoskeletal: No gross deformities. Extremities: No clubbing or cyanosis.   ECG: Most  recent ECG reviewed.      ASSESSMENT AND PLAN: 1. Fatigue: May be related to anemia and incomplete dialysis runs. Will await blood test results. Possibly needs to be transfused. Weight is same as discharge wt.  2. Hypertrophic cardiomyopathy: Symptomatically stable. Continue metoprolol 25 mg bid. Will make referral to Dr. Burt Knack for alcohol septal ablation consideration.  3. CAD with LAD stent in 2010: Symptomatically stable. On ASA, metoprolol, and Lipitor.  4. Chronic diastolic heart failure: Euvolemic with volume management per dialysis.  5. Paroxysmal atrial fibrillation: On amiodarone and warfarin. High risk for recurrence given mitral stenosis, regurgitation, HCM, and atrial enlargement. Currently in a regular rhythm.  6. Essential HTN: Mildly elevated. Will monitor.  7. PSVT: Symptomatically stable. Continue metoprolol 25 mg bid.  Dispo: f/u with Gerrianne Scale PA-C in  3 months. Should see Dr. Burt Knack within next 4-6 weeks ideally.  Time spent: 40 minutes, of which greater than 50% was spent reviewing symptoms, relevant blood tests and studies, and discussing management plan with the patient.   Kate Sable, M.D., F.A.C.C.   ADDENDUM: Communicated with Dr. Sherren Mocha. Neither he nor anyone else performs alcohol septal ablation in our group. He thought Dr. Derinda Sis at [facility] might be performing it, however. Will make a referral.

## 2015-04-29 NOTE — Patient Instructions (Addendum)
   Referral to Dr. Burt Knack - [address] office - [place] Continue all medications.   Follow up in  3 months

## 2015-05-01 NOTE — Discharge Summary (Signed)
[name] [name] JOI:[id number] DOB: 04/23/1941 DOA: 04/18/2015  PCP: [name] Burly, MD  Admit date: 04/18/2015 Discharge date: 04/21/2015  Time spent: 35 minutes  Recommendations for Outpatient Follow-up:  1. Please follow up on volume status, she was admitted for volume overload for which she underwent HD with 4L UF 2. She had a weight of 69.3 Kg on 04/21/2015   Discharge Diagnoses:  Principal Problem:  Atypical chest pain Active Problems:  Chest pain  Anemia  Chronic diastolic heart failure (HCC)  Essential hypertension  Paroxysmal atrial fibrillation (HCC)  Multiple myeloma (HCC)  Costochondritis  Chronic cough  GERD without esophagitis  Elevated troponin   Discharge Condition: Stable/Improved  Diet recommendation: Renal Diet  Filed Weights   04/20/15 1148 04/21/15 0555 04/21/15 1212  Weight: 68.5 kg (151 lb 0.2 oz) 67.314 kg (148 lb 6.4 oz) 69.3 kg (152 lb 12.5 oz)    History of present illness:  Ms. [name] [name] has a complex cardiovascular history including HCOM, HTN, chronic dCHF,CAD s/p stent LAD in 2010, PAF on amiodarone and Coumadin, multiple myeloma, and ESRD on HD T/TH/sat; who presents with complaints of continued cough, left-sided chest pain , and congestion/shortness of breath. She was just recently specialized last week on 11/29 at [facility] secondary to cough and low hbg of 7.5g/dL. all she was there she was transfused 1 unit of PRBC, given oxygen, received dialysis, and was placed on antibiotics for possible pneumonia or bronchitis( which family states was never confirmed). She had a 2-D echo while in [place] that appears similar to the previous study time in 09/2014. She was discharged home and had a follow-up appointment at the cardiologist office 3 days ago (12/07). Reports that no changes were made to her medication regimen. Office note with [name] Public, PA-C states patient should receive extra fluid taken off from dialysis,  should increase Norvasc to 10 mg daily on nondialysis days /5 mg on dialysis days, office note makes it seem like she is on Coumadin, and she was to follow-up with Dr. Bronson [name] in 2-3 weeks. However, she continued to have chest pain, cough, shortness of breath, and presented here to the emergency department of [facility] with her 2 sons after dialysis today. Chest pain located along the inferior border of her left breast has been ongoing for about 2 weeks. She reports is a achy pain that is worsened by coughing or trying to lay on her left side at night. Associated symptoms include shortness of breath related to coughing and chest pain. Cough symptoms had started back in May of 2016. She states that she was initially started on [employer] while at [facility] in May, which had improved her cough. Prior to her last hospitalization at [facility] symptoms had returned. She describes the cough as a dry hacking cough. Worsened with talking. She gets short of breath with little activity and this also worsens cough. Patient notes that she had a supratherapeutic INR >8 on 11/29 prior to being admitted at [facility] for which she was advised by the Coumadin nurse to stop Coumadin. She is on Coumadin for a history of PAF. She reports that during her hospitalization she was given possibly vitamin K because of the supratherapeutic INR, however it seems that Coumadin was never restarted during or at the end of her hospitalization.  Hospital Course:  Mrs [name] [name] is a 74 year old female with extensive past medical history including diastolic congestive heart failure, paroxysmal atrial fibrillation on chronic anticoagulation, end-stage renal disease on hemodialysis, multiple  myeloma admitted overnight when she presented with complaints of cough associated with shortness of breath and left-sided chest pain. She was recently hospitalized at [facility] for the same symptoms. Chest x-ray performed on admission showing  mild central pulmonary vascular congestion, no obvious infiltrates noted.   Cough -Patient reporting having cough over the past 3 weeks that is associated with shortness of breath, denies associated fevers, chills.  -Workup performed in the emergency department included a two-view chest x-ray that revealed mild central pulmonary vascular congestion. She had a CT scan of lungs with IV contrast performed earlier the see her on 09/29/2014 that revealed findings suggestive of pulmonary edema, no evidence of malignancy.  -She had significant improvement to symptoms with HD 4L UF -She had a weight of 69 Kg on day of discharge  2. Left sided chest pain. -Patient presenting with chest pain having atypical features which is reproducible to palpation and deep inspiration. She states pain is precipitated by cough -Suspect chest pain is musculoskeletal in origin -Rib series did not show evidence of lytic disease involving ribs.   3. End-stage renal disease. -She has currently a dialysis patient at [facility] in [facility] -Nephrology consulted for HD during this hospitalization.  -Symptoms likely related to volume overload.  -Was instructed to resume scheduled HD at [facility]  4. Paroxysmal atrial fibrillation -CHADSVasc score of 4, had been on chronic anticoagulation with warfarin. This was held last week for a supratherapeutic INR.  -Coumadin restarted on ths hospitalization.  -Continue amiodarone 200 mg daily and metoprolol 12.5 mg by mouth twice a day  5. Hypertrophic cardiomyopathy -Patient presenting with cough, labs showing a BNP of 1634 with a chest x-ray showing interstitial edema. -Undergoing HD  6. Hypertension. -Continue Norvasc 10 mg by mouth daily and metoprolol 12.5 mg by mouth twice a day  7. Multiple myeloma -She is currently under the care of Dr. Whitney Muse of medical oncology   Procedures:  HD  Consultations:  Nephrology  Discharge Exam: Filed Vitals:    04/21/15 1400 04/21/15 1430  BP: 111/50 117/52  Pulse: 78 83  Temp:    Resp:       General: Patient is nontoxic-appearing, no acute distress  Cardiovascular: Regular rate and rhythm normal S1-S2  Respiratory: Improved lung exam, resolution of crackles.  Abdomen: Soft nontender nondistended  Musculoskeletal: No edema   Discharge Instructions   Discharge Instructions    Call MD for: difficulty breathing, headache or visual disturbances  Complete by: As directed      Call MD for: extreme fatigue  Complete by: As directed      Call MD for: hives  Complete by: As directed      Call MD for: persistant dizziness or light-headedness  Complete by: As directed      Call MD for: persistant nausea and vomiting  Complete by: As directed      Call MD for: redness, tenderness, or signs of infection (pain, swelling, redness, odor or green/yellow discharge around incision site)  Complete by: As directed      Call MD for: severe uncontrolled pain  Complete by: As directed      Call MD for: temperature >100.4  Complete by: As directed      Call MD for:  Complete by: As directed      Diet - low sodium heart healthy  Complete by: As directed      Increase activity slowly  Complete by: As directed  Current Discharge Medication List    CONTINUE these medications which have NOT CHANGED   Details  amiodarone (PACERONE) 200 MG tablet Take 200 mg by mouth daily.     amLODipine (NORVASC) 10 MG tablet Take 1 tablet (10 mg total) by mouth daily. Qty: 90 tablet, Refills: 3    aspirin 81 MG EC tablet Take 81 mg by mouth daily.     atorvastatin (LIPITOR) 80 MG tablet Take 80 mg by mouth at bedtime.     benzonatate (TESSALON) 100 MG capsule Take 100 mg by mouth 3 (three) times daily as needed for cough.     denosumab (PROLIA) 60 MG/ML SOLN  injection Inject 60 mg into the skin every 6 (six) months. Administer in upper arm, thigh, or abdomen    dexamethasone (DECADRON) 4 MG tablet Take 56m by mouth once a week as directed by oncologist    latanoprost (XALATAN) 0.005 % ophthalmic solution Place 1 drop into both eyes at bedtime.    metoprolol tartrate (LOPRESSOR) 25 MG tablet Take 0.5 tablets (12.5 mg total) by mouth 2 (two) times daily. Qty: 90 tablet, Refills: 3    pantoprazole (PROTONIX) 40 MG tablet Take 1 tablet (40 mg total) by mouth daily. Qty: 30 tablet, Refills: 0    traMADol (ULTRAM) 50 MG tablet Take 1 tablet (50 mg total) by mouth every 6 (six) hours as needed. Qty: 20 tablet, Refills: 0    warfarin (COUMADIN) 4 MG tablet Take 2-4 mg by mouth daily at 6 PM.        Allergies  Allergen Reactions  . Morphine Shortness Of Breath and Other (See Comments)    Eyes became bloodshot  . Phenergan [Promethazine Hcl] Other (See Comments)    Restless, loopy  . Penicillins Rash   Follow-up Information    Follow up with XNeale Burly MD In 2 weeks.   Specialty: Internal Medicine   Contact information:   [place] [phone]         The results of significant diagnostics from this hospitalization (including imaging, microbiology, ancillary and laboratory) are listed below for reference.    Significant Diagnostic Studies:  Imaging Results    Dg Chest 2 View  04/18/2015 CLINICAL DATA: Cough, shortness of breath. EXAM: CHEST 2 VIEW COMPARISON: April 10, 2015. FINDINGS: Stable cardiomegaly. No pneumothorax or pleural effusion is noted. Stable compression fracture of lower thoracic spine. Right internal jugular dialysis catheter is unchanged, with distal tip in right atrium. Stable mild central pulmonary vascular congestion is noted. No acute pulmonary disease is noted. IMPRESSION: Stable cardiomegaly and mild central pulmonary  vascular congestion. Electronically Signed By: JMarijo Conception M.D. On: 04/18/2015 19:17   Dg Ribs Bilateral  04/19/2015 CLINICAL DATA: Cough for 2 weeks EXAM: BILATERAL RIBS - 3+ VIEW COMPARISON: 04/18/2015 FINDINGS: Cardiac shadow is stable. A dialysis catheter is again noted. No focal infiltrate is noted. No acute rib fracture is seen. IMPRESSION: No acute abnormality noted. Electronically Signed By: MInez CatalinaM.D. On: 04/19/2015 12:39     Microbiology: No results found for this or any previous visit (from the past 240 hour(s)).   Labs: Basic Metabolic Panel:  Last Labs      Recent Labs Lab 04/18/15 1841 04/19/15 0242 04/20/15 0411 04/21/15 1225  NA 138 138 136 137  K 4.0 4.2 4.7 4.3  CL 99* 99* 98* 100*  CO2 _0 GLUCOSE 126* 123* 103* 115*  BUN 14 17 33*  27*  CREATININE 4.81* 5.80* 8.36* 7.50*  CALCIUM 9.0 9.1 9.8 10.6*  PHOS --  4.6 --  6.6*     Liver Function Tests:  Last Labs      Recent Labs Lab 04/18/15 1841 04/19/15 0242 04/21/15 1225  AST 36 --  --   ALT 25 --  --   ALKPHOS 78 --  --   BILITOT 1.1 --  --   PROT 6.1* --  --   ALBUMIN 2.9* 2.6* 2.9*      Last Labs     No results for input(s): LIPASE, AMYLASE in the last 168 hours.    Last Labs     No results for input(s): AMMONIA in the last 168 hours.   CBC:  Last Labs      Recent Labs Lab 04/18/15 1841 04/19/15 0302 04/20/15 0411 04/21/15 0330 04/21/15 1225  WBC 7.1 6.2 6.9 9.9 12.0*  NEUTROABS 4.2 4.4 --  --  --   HGB 8.5* 8.1* 8.1* 8.8* 8.8*  HCT 27.3* 25.8* 27.2* 29.0* 29.2*  MCV 98.9 99.6 100.4* 101.4* 100.3*  PLT 159 138* 159 206 227     Cardiac Enzymes:  Last Labs      Recent Labs Lab 04/19/15 0056 04/19/15 0302 04/19/15 0630  TROPONINI 0.13* 0.13* 0.14*     BNP: BNP (last 3 results)  Recent Labs  (within last 365 days)     Recent Labs  09/28/14 2217 04/19/15 0242  BNP 597.6* 1634.1*      ProBNP (last 3 results)  Recent Labs (within last 365 days)    No results for input(s): PROBNP in the last 8760 hours.    CBG:  Last Labs     No results for input(s): GLUCAP in the last 168 hours.       SignedKelvin Cellar [facility] 04/21/2015, 2:53 PM

## 2015-06-10 DEATH — deceased

## 2015-07-27 ENCOUNTER — Ambulatory Visit: Payer: Medicare Other | Admitting: Cardiovascular Disease

## 2016-10-06 IMAGING — CR DG RIBS BILAT 3V
6 series · 6 of 6 positions shown · non-contrast
Comparison: 04/18/2015

CLINICAL DATA: Cough for 2 weeks

EXAM:
BILATERAL RIBS - 3+ VIEW

[rib pa obl (1 of 6)]
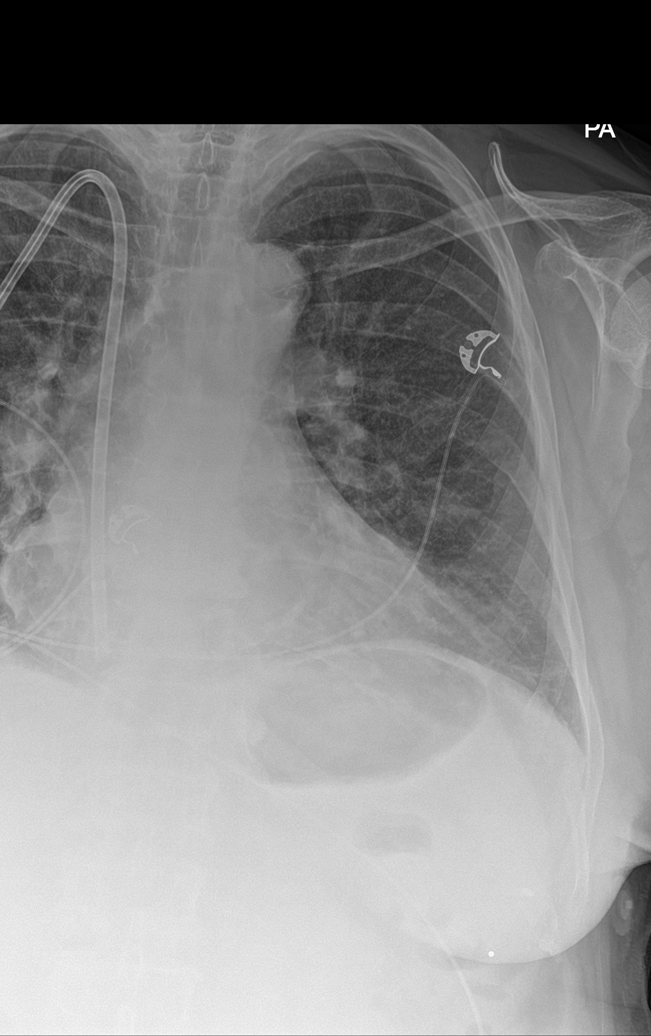

[rib pa obl (2 of 6)]
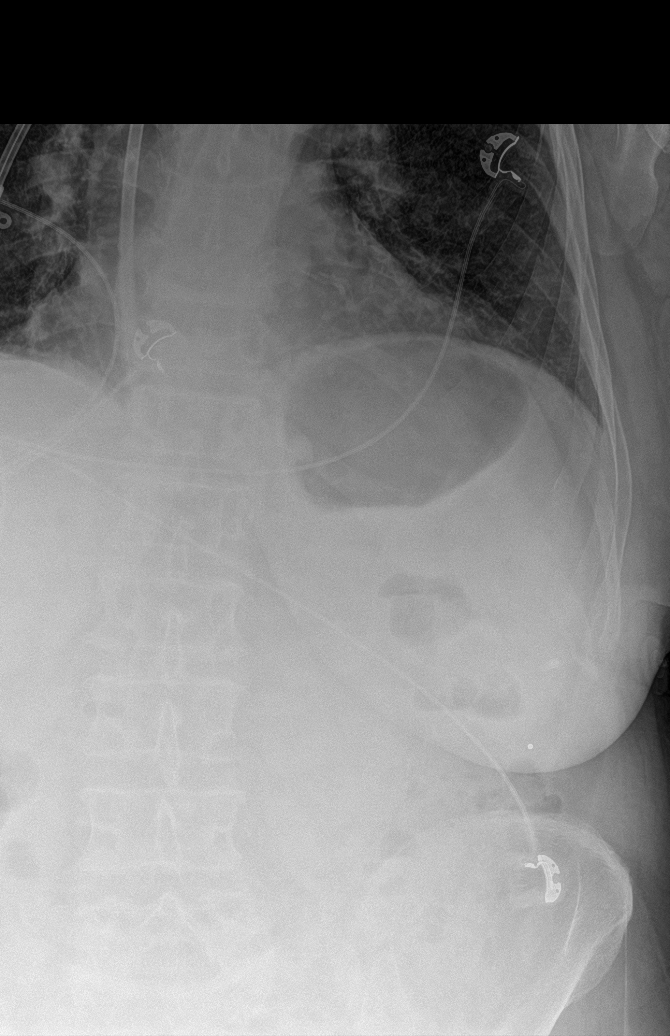

[rib pa obl (3 of 6)]
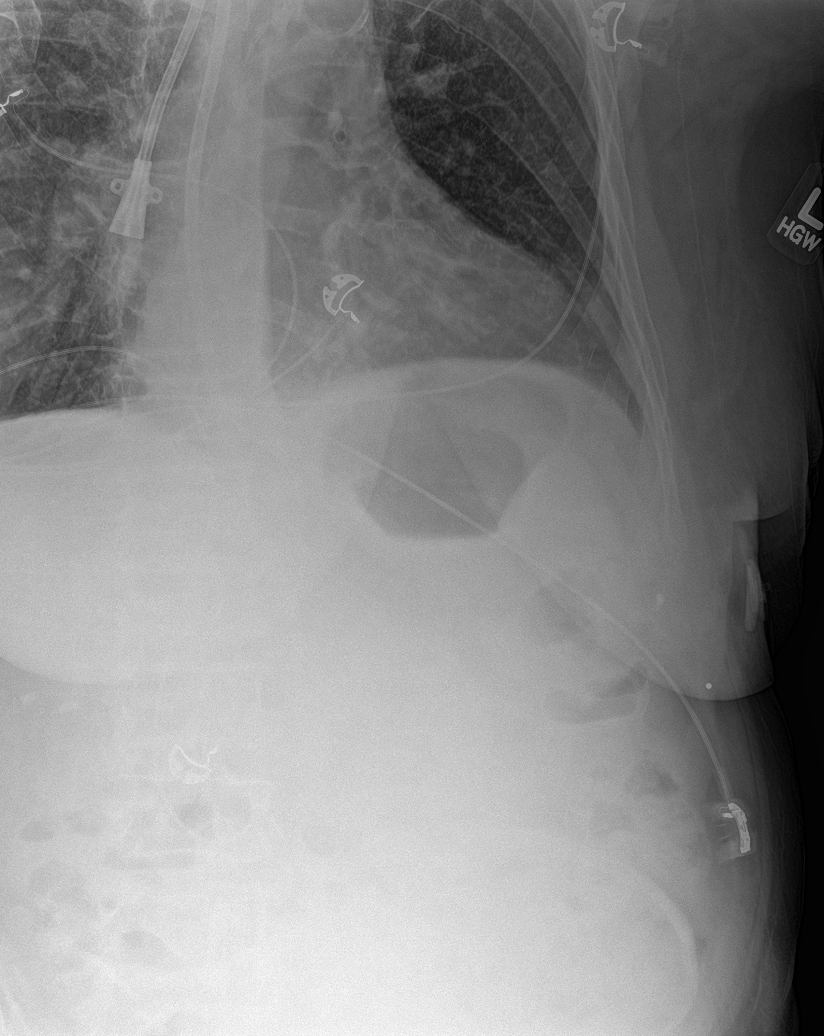

[rib pa obl (4 of 6)]
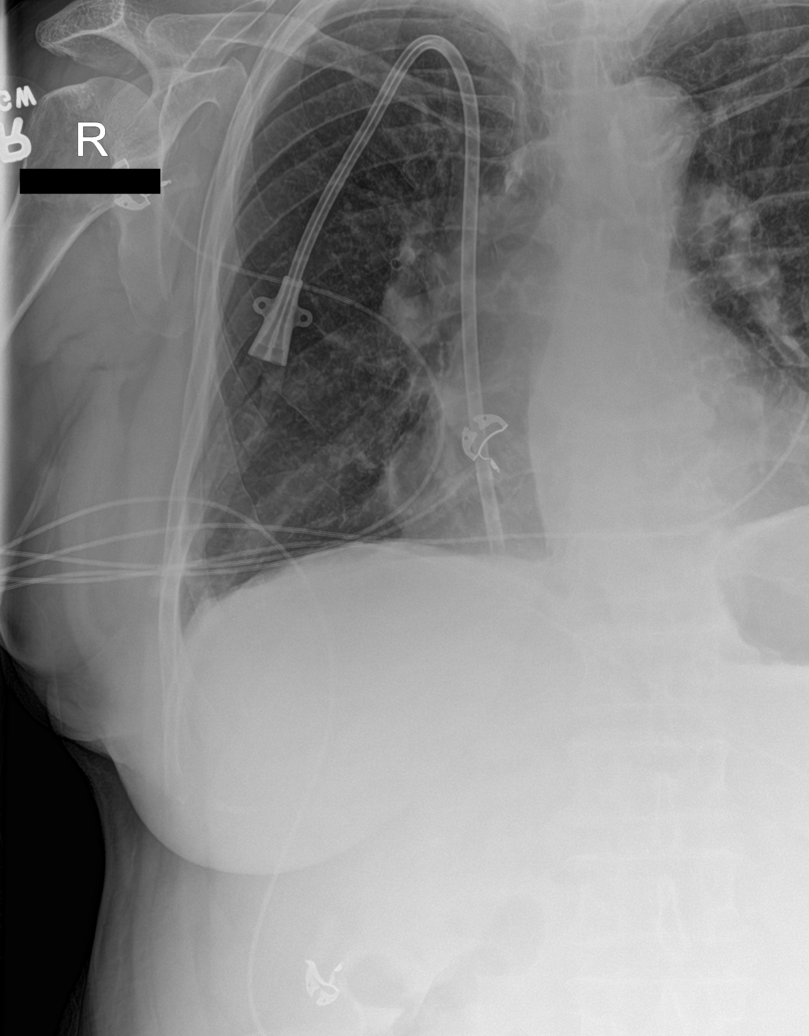

[rib pa obl (5 of 6)]
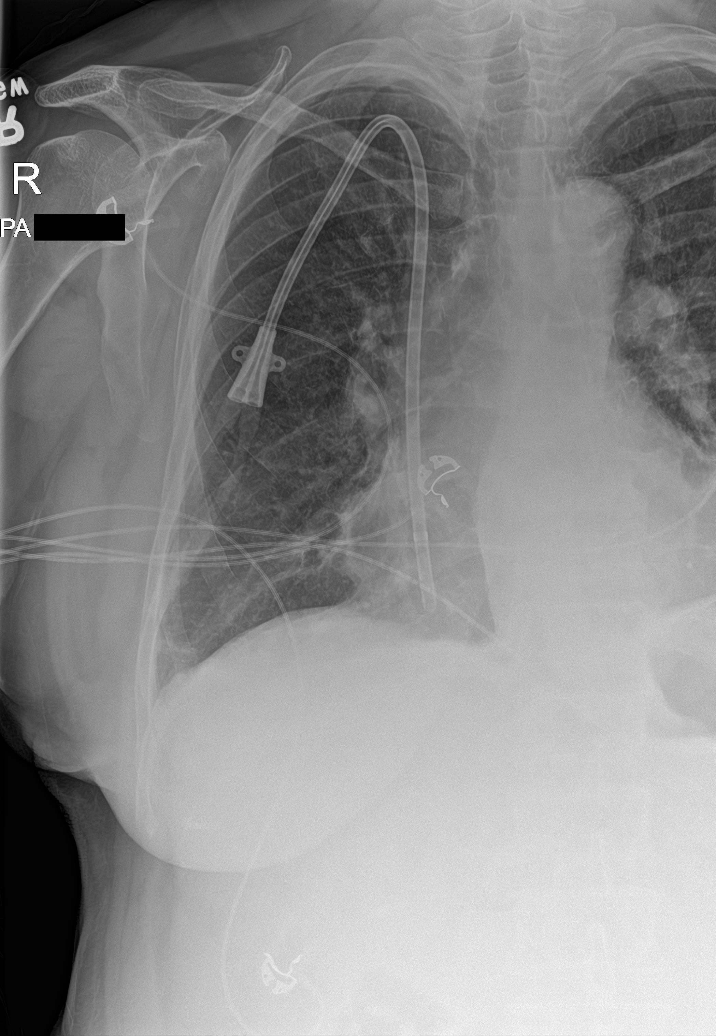

[rib pa obl (6 of 6)]
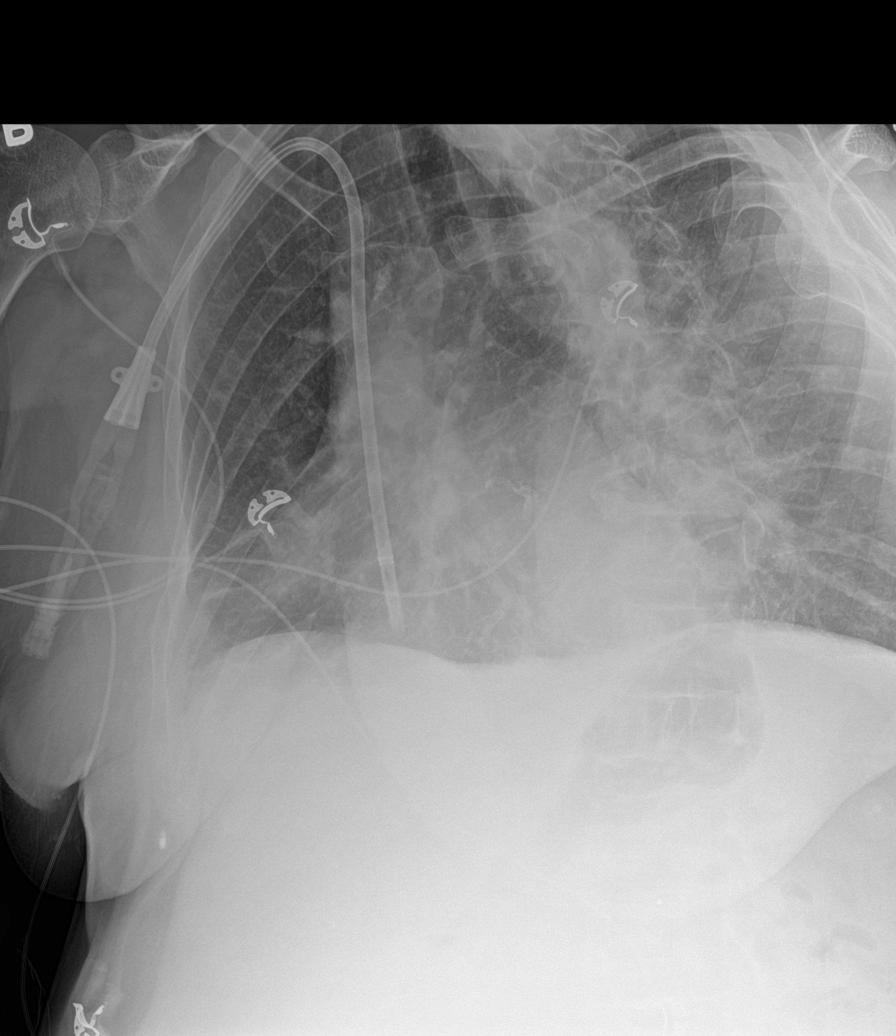

[6 of 6 positions shown; findings below may reference images not displayed]

FINDINGS: Cardiac shadow is stable. A dialysis catheter is again noted. No
focal infiltrate is noted. No acute rib fracture is seen.
IMPRESSION: No acute abnormality noted.
